# Patient Record
Sex: Female | Born: 1980
Health system: Southern US, Community
[De-identification: ages and names within clinical notes are randomized; demographics above are authoritative.]

## PROBLEM LIST (undated history)

## (undated) ENCOUNTER — Inpatient Hospital Stay (HOSPITAL_COMMUNITY): Payer: Self-pay

## (undated) DIAGNOSIS — C189 Malignant neoplasm of colon, unspecified: Secondary | ICD-10-CM

## (undated) DIAGNOSIS — F32A Depression, unspecified: Secondary | ICD-10-CM

## (undated) DIAGNOSIS — K602 Anal fissure, unspecified: Secondary | ICD-10-CM

## (undated) DIAGNOSIS — F419 Anxiety disorder, unspecified: Secondary | ICD-10-CM

## (undated) DIAGNOSIS — F329 Major depressive disorder, single episode, unspecified: Secondary | ICD-10-CM

## (undated) HISTORY — DX: Anxiety disorder, unspecified: F41.9

## (undated) HISTORY — PX: THERAPEUTIC ABORTION: SHX798

## (undated) HISTORY — DX: Depression, unspecified: F32.A

## (undated) HISTORY — PX: NO PAST SURGERIES: SHX2092

## (undated) HISTORY — DX: Malignant neoplasm of colon, unspecified: C18.9

---
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
## 1999-05-29 ENCOUNTER — Emergency Department (HOSPITAL_COMMUNITY): Admission: EM | Admit: 1999-05-29 | Discharge: 1999-05-29 | Payer: Self-pay | Admitting: Emergency Medicine

## 2000-10-03 ENCOUNTER — Inpatient Hospital Stay (HOSPITAL_COMMUNITY): Admission: AD | Admit: 2000-10-03 | Discharge: 2000-10-03 | Payer: Self-pay | Admitting: Obstetrics

## 2001-01-25 ENCOUNTER — Emergency Department (HOSPITAL_COMMUNITY): Admission: EM | Admit: 2001-01-25 | Discharge: 2001-01-25 | Payer: Self-pay

## 2001-03-10 ENCOUNTER — Inpatient Hospital Stay (HOSPITAL_COMMUNITY): Admission: AD | Admit: 2001-03-10 | Discharge: 2001-03-10 | Payer: Self-pay | Admitting: *Deleted

## 2001-04-02 ENCOUNTER — Ambulatory Visit (HOSPITAL_COMMUNITY): Admission: RE | Admit: 2001-04-02 | Discharge: 2001-04-02 | Payer: Self-pay | Admitting: *Deleted

## 2001-08-19 ENCOUNTER — Inpatient Hospital Stay (HOSPITAL_COMMUNITY): Admission: AD | Admit: 2001-08-19 | Discharge: 2001-08-19 | Payer: Self-pay | Admitting: *Deleted

## 2001-08-27 ENCOUNTER — Inpatient Hospital Stay (HOSPITAL_COMMUNITY): Admission: AD | Admit: 2001-08-27 | Discharge: 2001-08-29 | Payer: Self-pay | Admitting: *Deleted

## 2002-08-11 ENCOUNTER — Emergency Department (HOSPITAL_COMMUNITY): Admission: EM | Admit: 2002-08-11 | Discharge: 2002-08-11 | Payer: Self-pay | Admitting: Emergency Medicine

## 2003-03-21 ENCOUNTER — Emergency Department (HOSPITAL_COMMUNITY): Admission: EM | Admit: 2003-03-21 | Discharge: 2003-03-21 | Payer: Self-pay | Admitting: Emergency Medicine

## 2003-12-09 ENCOUNTER — Emergency Department (HOSPITAL_COMMUNITY): Admission: EM | Admit: 2003-12-09 | Discharge: 2003-12-10 | Payer: Self-pay

## 2005-01-11 ENCOUNTER — Emergency Department (HOSPITAL_COMMUNITY): Admission: EM | Admit: 2005-01-11 | Discharge: 2005-01-11 | Payer: Self-pay | Admitting: Emergency Medicine

## 2005-10-03 ENCOUNTER — Emergency Department (HOSPITAL_COMMUNITY): Admission: EM | Admit: 2005-10-03 | Discharge: 2005-10-03 | Payer: Self-pay | Admitting: Emergency Medicine

## 2006-06-20 ENCOUNTER — Emergency Department (HOSPITAL_COMMUNITY): Admission: EM | Admit: 2006-06-20 | Discharge: 2006-06-20 | Payer: Self-pay | Admitting: Emergency Medicine

## 2006-08-12 ENCOUNTER — Emergency Department (HOSPITAL_COMMUNITY): Admission: EM | Admit: 2006-08-12 | Discharge: 2006-08-12 | Payer: Self-pay | Admitting: *Deleted

## 2006-10-29 ENCOUNTER — Inpatient Hospital Stay (HOSPITAL_COMMUNITY): Admission: AD | Admit: 2006-10-29 | Discharge: 2006-10-29 | Payer: Self-pay | Admitting: Family Medicine

## 2007-02-06 ENCOUNTER — Emergency Department (HOSPITAL_COMMUNITY): Admission: EM | Admit: 2007-02-06 | Discharge: 2007-02-06 | Payer: Self-pay | Admitting: Family Medicine

## 2007-10-16 LAB — OB RESULTS CONSOLE VARICELLA ZOSTER ANTIBODY, IGG: Varicella: IMMUNE

## 2007-10-21 ENCOUNTER — Ambulatory Visit (HOSPITAL_COMMUNITY): Admission: RE | Admit: 2007-10-21 | Discharge: 2007-10-21 | Payer: Self-pay | Admitting: Family Medicine

## 2007-11-28 ENCOUNTER — Ambulatory Visit (HOSPITAL_COMMUNITY): Admission: RE | Admit: 2007-11-28 | Discharge: 2007-11-28 | Payer: Self-pay | Admitting: Family Medicine

## 2007-12-09 ENCOUNTER — Ambulatory Visit (HOSPITAL_COMMUNITY): Admission: RE | Admit: 2007-12-09 | Discharge: 2007-12-09 | Payer: Self-pay | Admitting: Family Medicine

## 2007-12-31 ENCOUNTER — Inpatient Hospital Stay (HOSPITAL_COMMUNITY): Admission: AD | Admit: 2007-12-31 | Discharge: 2007-12-31 | Payer: Self-pay | Admitting: Family Medicine

## 2008-04-12 ENCOUNTER — Inpatient Hospital Stay (HOSPITAL_COMMUNITY): Admission: AD | Admit: 2008-04-12 | Discharge: 2008-04-12 | Payer: Self-pay | Admitting: Obstetrics & Gynecology

## 2008-04-12 ENCOUNTER — Ambulatory Visit: Payer: Self-pay | Admitting: Obstetrics and Gynecology

## 2008-04-23 ENCOUNTER — Ambulatory Visit: Payer: Self-pay | Admitting: Obstetrics and Gynecology

## 2008-04-23 ENCOUNTER — Inpatient Hospital Stay (HOSPITAL_COMMUNITY): Admission: AD | Admit: 2008-04-23 | Discharge: 2008-04-23 | Payer: Self-pay | Admitting: Obstetrics & Gynecology

## 2008-05-04 ENCOUNTER — Ambulatory Visit: Payer: Self-pay | Admitting: Obstetrics and Gynecology

## 2008-05-04 ENCOUNTER — Inpatient Hospital Stay (HOSPITAL_COMMUNITY): Admission: AD | Admit: 2008-05-04 | Discharge: 2008-05-06 | Payer: Self-pay | Admitting: Obstetrics & Gynecology

## 2010-06-09 LAB — CBC
HCT: 36.3 % (ref 36.0–46.0)
MCHC: 33.8 g/dL (ref 30.0–36.0)
Platelets: 267 10*3/uL (ref 150–400)
RBC: 3.32 MIL/uL — ABNORMAL LOW (ref 3.87–5.11)
RDW: 13.8 % (ref 11.5–15.5)

## 2010-06-14 LAB — URINALYSIS, ROUTINE W REFLEX MICROSCOPIC
Glucose, UA: NEGATIVE mg/dL
Protein, ur: NEGATIVE mg/dL
Urobilinogen, UA: 0.2 mg/dL (ref 0.0–1.0)

## 2010-07-07 ENCOUNTER — Inpatient Hospital Stay (INDEPENDENT_AMBULATORY_CARE_PROVIDER_SITE_OTHER)
Admission: RE | Admit: 2010-07-07 | Discharge: 2010-07-07 | Disposition: A | Discharge status: Home / Self Care | Payer: Medicaid Other | Source: Ambulatory Visit | Attending: Family Medicine | Admitting: Family Medicine

## 2010-07-07 DIAGNOSIS — J020 Streptococcal pharyngitis: Secondary | ICD-10-CM

## 2010-07-07 LAB — POCT RAPID STREP A (OFFICE): Streptococcus, Group A Screen (Direct): POSITIVE — AB

## 2010-10-03 ENCOUNTER — Emergency Department (HOSPITAL_COMMUNITY)
Admission: EM | Admit: 2010-10-03 | Discharge: 2010-10-04 | Disposition: A | Discharge status: Home / Self Care | Payer: Self-pay | Attending: Emergency Medicine | Admitting: Emergency Medicine

## 2010-10-03 DIAGNOSIS — K921 Melena: Secondary | ICD-10-CM | POA: Insufficient documentation

## 2010-10-03 DIAGNOSIS — K5900 Constipation, unspecified: Secondary | ICD-10-CM | POA: Insufficient documentation

## 2010-10-03 DIAGNOSIS — K625 Hemorrhage of anus and rectum: Secondary | ICD-10-CM | POA: Insufficient documentation

## 2010-10-03 DIAGNOSIS — K644 Residual hemorrhoidal skin tags: Secondary | ICD-10-CM | POA: Insufficient documentation

## 2010-10-03 DIAGNOSIS — K6289 Other specified diseases of anus and rectum: Secondary | ICD-10-CM | POA: Insufficient documentation

## 2010-10-03 DIAGNOSIS — K648 Other hemorrhoids: Secondary | ICD-10-CM | POA: Insufficient documentation

## 2010-11-29 LAB — URINALYSIS, ROUTINE W REFLEX MICROSCOPIC
Bilirubin Urine: NEGATIVE
Ketones, ur: 15 — AB
Nitrite: NEGATIVE
Protein, ur: NEGATIVE
Urobilinogen, UA: 0.2

## 2010-12-05 LAB — POCT PREGNANCY, URINE: Operator id: 239701

## 2010-12-05 LAB — POCT URINALYSIS DIP (DEVICE)
Bilirubin Urine: NEGATIVE
Ketones, ur: NEGATIVE
Operator id: 239701

## 2010-12-09 LAB — URINALYSIS, ROUTINE W REFLEX MICROSCOPIC
Ketones, ur: NEGATIVE
Protein, ur: NEGATIVE
Urobilinogen, UA: 0.2

## 2010-12-09 LAB — WET PREP, GENITAL
Trich, Wet Prep: NONE SEEN
Yeast Wet Prep HPF POC: NONE SEEN

## 2010-12-09 LAB — GC/CHLAMYDIA PROBE AMP, GENITAL: Chlamydia, DNA Probe: NEGATIVE

## 2010-12-09 LAB — POCT PREGNANCY, URINE: Preg Test, Ur: NEGATIVE

## 2010-12-09 LAB — HCG, SERUM, QUALITATIVE: Preg, Serum: NEGATIVE

## 2010-12-09 LAB — URINE MICROSCOPIC-ADD ON

## 2010-12-14 LAB — DIFFERENTIAL
Basophils Absolute: 0
Basophils Relative: 0
Eosinophils Absolute: 0.1
Eosinophils Relative: 1
Monocytes Absolute: 0.7
Monocytes Relative: 7
Neutro Abs: 8.2 — ABNORMAL HIGH

## 2010-12-14 LAB — URINE MICROSCOPIC-ADD ON

## 2010-12-14 LAB — I-STAT 8, (EC8 V) (CONVERTED LAB)
BUN: 7
Bicarbonate: 26.2 — ABNORMAL HIGH
Chloride: 105
Glucose, Bld: 89
pCO2, Ven: 48.7

## 2010-12-14 LAB — CBC
Hemoglobin: 12
MCHC: 33.5
MCV: 91.2
RDW: 14.3 — ABNORMAL HIGH

## 2010-12-14 LAB — URINALYSIS, ROUTINE W REFLEX MICROSCOPIC
Bilirubin Urine: NEGATIVE
Hgb urine dipstick: NEGATIVE
Nitrite: NEGATIVE
Specific Gravity, Urine: 1.022
Urobilinogen, UA: 0.2
pH: 6

## 2010-12-14 LAB — POCT PREGNANCY, URINE: Preg Test, Ur: NEGATIVE

## 2011-08-15 ENCOUNTER — Encounter (HOSPITAL_COMMUNITY): Payer: Self-pay

## 2011-08-15 ENCOUNTER — Emergency Department (HOSPITAL_COMMUNITY)
Admission: EM | Admit: 2011-08-15 | Discharge: 2011-08-15 | Disposition: A | Discharge status: Home / Self Care | Payer: Medicaid Other | Source: Home / Self Care | Attending: Emergency Medicine | Admitting: Emergency Medicine

## 2011-08-15 DIAGNOSIS — J069 Acute upper respiratory infection, unspecified: Secondary | ICD-10-CM

## 2011-08-15 MED ORDER — AZITHROMYCIN 250 MG PO TABS: ORAL_TABLET | ORAL | Status: AC

## 2011-08-15 MED ORDER — NAPROXEN 500 MG PO TABS: 500.0000 mg | ORAL_TABLET | Freq: Two times a day (BID) | ORAL | Status: DC

## 2011-08-15 MED ORDER — ALBUTEROL SULFATE HFA 108 (90 BASE) MCG/ACT IN AERS: 1.0000 | INHALATION_SPRAY | Freq: Four times a day (QID) | RESPIRATORY_TRACT | Status: DC | PRN

## 2011-08-15 MED ORDER — BENZONATATE 200 MG PO CAPS: 200.0000 mg | ORAL_CAPSULE | Freq: Three times a day (TID) | ORAL | Status: AC | PRN

## 2011-08-15 NOTE — ED Provider Notes (Signed)
Chief Complaint  Patient presents with  . Cough    History of Present Illness:   The patient is a 31 year old female who has a 3 to four-day history of a cough productive of small amounts of clear sputum, pain over her sternum when she coughs, and a slight sore throat. She denies any fevers, chills, sweats, headache, nasal congestion, rhinorrhea, postnasal drip, wheezing, shortness of breath, abdominal pain, nausea, vomiting, or diarrhea. She is an occasional smoker.  Review of Systems:  Other than noted above, the patient denies any of the following symptoms. Systemic:  No fever, chills, sweats, fatigue, myalgias, headache, or anorexia. Eye:  No redness, pain or drainage. ENT:  No earache, ear congestion, nasal congestion, sneezing, rhinorrhea, sinus pressure, sinus pain, post nasal drip, or sore throat. Lungs:  No cough, sputum production, wheezing, shortness of breath, or chest pain. GI:  No abdominal pain, nausea, vomiting, or diarrhea. Skin:  No rash or itching.  PMFSH:  Past medical history, family history, social history, meds, and allergies were reviewed.  Physical Exam:   Vital signs:  BP 113/74  Pulse 73  Temp 98.4 F (36.9 C) (Oral)  Resp 18  SpO2 98%  LMP 08/08/2011 General:  Alert, in no distress. Eye:  No conjunctival injection or drainage. Lids were normal. ENT:  TMs and canals were normal, without erythema or inflammation.  Nasal mucosa was clear and uncongested, without drainage.  Mucous membranes were moist.  Pharynx was clear, without exudate or drainage.  There were no oral ulcerations or lesions. Neck:  Supple, no adenopathy, tenderness or mass. Lungs:  No respiratory distress.  Lungs were clear to auscultation, without wheezes, rales or rhonchi.  Breath sounds were clear and equal bilaterally. Lungs were resonant to percussion.  No egophony. Heart:  Regular rhythm, without gallops, murmers or rubs. Skin:  Clear, warm, and dry, without rash or lesions.  Labs:     Results for orders placed during the hospital encounter of 08/15/11  POCT RAPID STREP A ([facility] ONLY)      Component Value Range   Streptococcus, Group A Screen (Direct) NEGATIVE  NEGATIVE   Assessment:  The encounter diagnosis was Viral upper respiratory infection.  Plan:   1.  The following meds were prescribed:   New Prescriptions   ALBUTEROL (PROVENTIL HFA;VENTOLIN HFA) 108 (90 BASE) MCG/ACT INHALER    Inhale 1-2 puffs into the lungs every 6 (six) hours as needed for wheezing.   AZITHROMYCIN (ZITHROMAX Z-PAK) 250 MG TABLET    Take as directed.   BENZONATATE (TESSALON) 200 MG CAPSULE    Take 1 capsule (200 mg total) by mouth 3 (three) times daily as needed for cough.   NAPROXEN (NAPROSYN) 500 MG TABLET    Take 1 tablet (500 mg total) by mouth 2 (two) times daily.   2.  The patient was instructed in symptomatic care and handouts were given. 3.  The patient was told to return if becoming worse in any way, if no better in 3 or 4 days, and given some red flag symptoms that would indicate earlier return. She was strongly encouraged to quit smoking and I told her not to get the antibiotic prescription filled unless her illness lasted beyond a week to 10 days or if she developed any purulent sputum or nasal drainage.   Reuben Likes, MD 08/15/11 [phone]

## 2011-08-15 NOTE — ED Notes (Signed)
C/o cough, fever, ST; reportedly had blisters on back of throat this AM; NAD

## 2011-08-15 NOTE — Discharge Instructions (Signed)

## 2012-03-12 ENCOUNTER — Encounter (HOSPITAL_COMMUNITY): Payer: Self-pay | Admitting: *Deleted

## 2012-03-12 ENCOUNTER — Emergency Department (HOSPITAL_COMMUNITY)
Admission: EM | Admit: 2012-03-12 | Discharge: 2012-03-13 | Disposition: A | Discharge status: Home / Self Care | Payer: Self-pay | Attending: Emergency Medicine | Admitting: Emergency Medicine

## 2012-03-12 DIAGNOSIS — R141 Gas pain: Secondary | ICD-10-CM | POA: Insufficient documentation

## 2012-03-12 DIAGNOSIS — R142 Eructation: Secondary | ICD-10-CM | POA: Insufficient documentation

## 2012-03-12 DIAGNOSIS — R140 Abdominal distension (gaseous): Secondary | ICD-10-CM

## 2012-03-12 DIAGNOSIS — Z3202 Encounter for pregnancy test, result negative: Secondary | ICD-10-CM | POA: Insufficient documentation

## 2012-03-12 DIAGNOSIS — N949 Unspecified condition associated with female genital organs and menstrual cycle: Secondary | ICD-10-CM | POA: Insufficient documentation

## 2012-03-12 DIAGNOSIS — F17200 Nicotine dependence, unspecified, uncomplicated: Secondary | ICD-10-CM | POA: Insufficient documentation

## 2012-03-12 LAB — CBC WITH DIFFERENTIAL/PLATELET
Basophils Absolute: 0 10*3/uL (ref 0.0–0.1)
Basophils Relative: 1 % (ref 0–1)
Eosinophils Absolute: 0.2 10*3/uL (ref 0.0–0.7)
Eosinophils Relative: 2 % (ref 0–5)
HCT: 33.1 % — ABNORMAL LOW (ref 36.0–46.0)
Hemoglobin: 11.3 g/dL — ABNORMAL LOW (ref 12.0–15.0)
MCH: 30.3 pg (ref 26.0–34.0)
MCHC: 34.1 g/dL (ref 30.0–36.0)
MCV: 88.7 fL (ref 78.0–100.0)
Monocytes Absolute: 0.6 10*3/uL (ref 0.1–1.0)
Monocytes Relative: 8 % (ref 3–12)
RDW: 13.8 % (ref 11.5–15.5)

## 2012-03-12 LAB — URINALYSIS, ROUTINE W REFLEX MICROSCOPIC
Glucose, UA: NEGATIVE mg/dL
Hgb urine dipstick: NEGATIVE
Ketones, ur: NEGATIVE mg/dL
Protein, ur: NEGATIVE mg/dL
Urobilinogen, UA: 0.2 mg/dL (ref 0.0–1.0)

## 2012-03-12 LAB — POCT PREGNANCY, URINE: Preg Test, Ur: NEGATIVE

## 2012-03-12 MED ORDER — SODIUM CHLORIDE 0.9 % IV BOLUS (SEPSIS)
1000.0000 mL | Freq: Once | INTRAVENOUS | Status: AC
  Administered 2012-03-13: 1000 mL via INTRAVENOUS

## 2012-03-12 NOTE — ED Notes (Signed)
Pt states she has had bloating and gas x 2-3 days.  Denies vomiting, diarrhea, dysuria, no vaginal discharge/no bleeding.

## 2012-03-12 NOTE — ED Provider Notes (Signed)
History     CSN: [id number]  Arrival date & time 03/12/12  4098   First MD Initiated Contact with Patient 03/12/12 2233      Chief Complaint  Patient presents with  . Abdominal Pain    (Consider location/radiation/quality/duration/timing/severity/associated sxs/prior treatment) HPI Comments: Katie Reed is a 32 year old female patient with a history of G3P3 and hemorrhoids who presents to the emergency department complaining of abdominal bloating that started three days ago.  Associated symptoms include increased flatulence and 24 hours of spotting that occurred last Saturday. Patient is the depo shot shot.   After the spotting stopped she began having the abdominal bloating.  She stated she notices some pelvic pain when she pushes on her pelvic region, but has not abdominal pain at rest and denies focal abdominal pain or tenderness.  She denies nausea, vomiting, diarrhea, constipation, headaches, neck pain, chest pain, dizziness, lightheadedness, syncope, shortness of breath, unusual vaginal discharge, urinary urgency, frequency or burning and lower extremity edema or pain.  She took castor oil without relief of the abdominal bloating.  The history is provided by the patient and medical records.    History reviewed. No pertinent past medical history.  History reviewed. No pertinent past surgical history.  History reviewed. No pertinent family history.  History  Substance Use Topics  . Smoking status: Current Some Day Smoker  . Smokeless tobacco: Not on file  . Alcohol Use: Yes    OB History    Grav Para Term Preterm Abortions TAB SAB Ect Mult Living                  Review of Systems  Constitutional: Negative for fever, diaphoresis, appetite change, fatigue and unexpected weight change.  HENT: Negative for mouth sores, trouble swallowing, neck pain and neck stiffness.   Respiratory: Negative for cough, chest tightness, shortness of breath, wheezing and stridor.     Cardiovascular: Negative for chest pain and palpitations.  Gastrointestinal: Positive for abdominal pain and abdominal distention. Negative for nausea, vomiting, diarrhea, constipation, blood in stool (discomfort) and rectal pain.  Genitourinary: Negative for dysuria, urgency, frequency, hematuria, flank pain and difficulty urinating.  Musculoskeletal: Negative for back pain.  Skin: Negative for rash.  Neurological: Negative for weakness.  Hematological: Negative for adenopathy.  Psychiatric/Behavioral: Negative for confusion.  All other systems reviewed and are negative.    Allergies  Review of patient's allergies indicates no known allergies.  Home Medications   Current Outpatient Rx  Name  Route  Sig  Dispense  Refill  . DICYCLOMINE HCL 10 MG PO CAPS   Oral   Take 1 capsule (10 mg total) by mouth 4 (four) times daily -  before meals and at bedtime.   40 capsule   1     BP 128/81  Pulse 74  Temp 98.3 F (36.8 C) (Oral)  Resp 20  SpO2 100%  LMP 03/09/2012  Physical Exam  Nursing note and vitals reviewed. Constitutional: She is oriented to person, place, and time. She appears well-developed and well-nourished. No distress.  HENT:  Head: Normocephalic and atraumatic.  Mouth/Throat: Oropharynx is clear and moist. No oropharyngeal exudate.  Eyes: Conjunctivae normal are normal. No scleral icterus.  Neck: Normal range of motion. Neck supple.  Cardiovascular: Normal rate, regular rhythm, normal heart sounds and intact distal pulses.  Exam reveals no gallop and no friction rub.   No murmur heard. Pulmonary/Chest: Effort normal and breath sounds normal. No respiratory distress. She has no wheezes.  She has no rales. She exhibits no tenderness.  Abdominal: Soft. Normal appearance. She exhibits distension. She exhibits no fluid wave, no abdominal bruit, no ascites and no mass. Bowel sounds are increased. There is no hepatosplenomegaly. There is no tenderness. There is no  rigidity, no rebound, no guarding, no CVA tenderness, no tenderness at McBurney's point and negative Murphy's sign.  Musculoskeletal: Normal range of motion. She exhibits no edema and no tenderness.  Lymphadenopathy:    She has no cervical adenopathy.  Neurological: She is alert and oriented to person, place, and time. She exhibits normal muscle tone. Coordination normal.       Speech is clear and goal oriented Moves extremities without ataxia  Skin: Skin is warm and dry. No rash noted. She is not diaphoretic. No erythema.  Psychiatric: She has a normal mood and affect.    ED Course  Procedures (including critical care time)  Labs Reviewed  CBC WITH DIFFERENTIAL - Abnormal; Notable for the following:    RBC 3.73 (*)     Hemoglobin 11.3 (*)     HCT 33.1 (*)     Neutrophils Relative 42 (*)     Lymphocytes Relative 48 (*)     All other components within normal limits  COMPREHENSIVE METABOLIC PANEL - Abnormal; Notable for the following:    Sodium 134 (*)     Glucose, Bld 100 (*)     Total Bilirubin 0.2 (*)     GFR calc non Af Amer 90 (*)     All other components within normal limits  URINALYSIS, ROUTINE W REFLEX MICROSCOPIC  POCT PREGNANCY, URINE  LIPASE, BLOOD   No results found.   1. Abdominal bloating       MDM  Christianne Borrow presents with  abdominal bloating to 3 days without focal abdominal tenderness.  She is without vomiting diarrhea dysuria vaginal discharge or vaginal bleeding.  Patient is nontoxic, nonseptic appearing, in no apparent distress.  Patient is without abdominal pain on exam.  Fluid bolus given.  Labs and vitals reviewed.  Do not believe that a CT scan is indicated at this time as patient is a soft, nontender abdomen without peritoneal signs and without focal abdominal tenderness. Patient does not meet the SIRS or Sepsis criteria.  On repeat exam patient does not have a surgical abdomin and there are nor peritoneal signs.  No concern for of appendicitis,  bowel obstruction, bowel perforation, cholecystitis, diverticulitis, PID or ectopic pregnancy.  Patient discharged home with symptomatic treatment including bentyl and given strict instructions for follow-up with their primary care physician.  I have also discussed reasons to return immediately to the ER.  Patient expresses understanding and agrees with plan.  1. Medications: bentyl, usual home medications 2. Treatment: rest, drink plenty of fluids, medication as prescribed 3. Follow Up: Please followup with your primary doctor for discussion of your diagnoses and further evaluation after today's visit; if you do not have a primary care doctor use the resource guide provided to find one;             Dierdre Forth, PA-C 03/13/12 0042

## 2012-03-13 LAB — COMPREHENSIVE METABOLIC PANEL
AST: 21 U/L (ref 0–37)
Albumin: 3.7 g/dL (ref 3.5–5.2)
BUN: 23 mg/dL (ref 6–23)
Calcium: 9.6 mg/dL (ref 8.4–10.5)
Chloride: 99 mEq/L (ref 96–112)
Creatinine, Ser: 0.85 mg/dL (ref 0.50–1.10)
Total Bilirubin: 0.2 mg/dL — ABNORMAL LOW (ref 0.3–1.2)
Total Protein: 7.3 g/dL (ref 6.0–8.3)

## 2012-03-13 LAB — LIPASE, BLOOD: Lipase: 36 U/L (ref 11–59)

## 2012-03-13 MED ORDER — DICYCLOMINE HCL 10 MG PO CAPS: 10.0000 mg | ORAL_CAPSULE | Freq: Three times a day (TID) | ORAL | Status: DC

## 2012-03-13 NOTE — ED Provider Notes (Signed)
Medical screening examination/treatment/procedure(s) were performed by non-physician practitioner and as supervising physician I was immediately available for consultation/collaboration.   Dione Booze, MD 03/13/12 [phone]

## 2012-03-13 NOTE — ED Notes (Signed)
Pt denies any questions or pain upon discharge. 

## 2012-06-17 ENCOUNTER — Emergency Department (HOSPITAL_COMMUNITY)
Admission: EM | Admit: 2012-06-17 | Discharge: 2012-06-17 | Disposition: A | Discharge status: Home / Self Care | Payer: Self-pay | Attending: Emergency Medicine | Admitting: Emergency Medicine

## 2012-06-17 ENCOUNTER — Encounter (HOSPITAL_COMMUNITY): Payer: Self-pay | Admitting: *Deleted

## 2012-06-17 DIAGNOSIS — Z113 Encounter for screening for infections with a predominantly sexual mode of transmission: Secondary | ICD-10-CM | POA: Insufficient documentation

## 2012-06-17 DIAGNOSIS — A64 Unspecified sexually transmitted disease: Secondary | ICD-10-CM

## 2012-06-17 DIAGNOSIS — F17200 Nicotine dependence, unspecified, uncomplicated: Secondary | ICD-10-CM | POA: Insufficient documentation

## 2012-06-17 DIAGNOSIS — Z3202 Encounter for pregnancy test, result negative: Secondary | ICD-10-CM | POA: Insufficient documentation

## 2012-06-17 DIAGNOSIS — N898 Other specified noninflammatory disorders of vagina: Secondary | ICD-10-CM | POA: Insufficient documentation

## 2012-06-17 LAB — URINALYSIS, ROUTINE W REFLEX MICROSCOPIC
Bilirubin Urine: NEGATIVE
Ketones, ur: NEGATIVE mg/dL
Leukocytes, UA: NEGATIVE
Nitrite: NEGATIVE
Specific Gravity, Urine: 1.008 (ref 1.005–1.030)
Urobilinogen, UA: 0.2 mg/dL (ref 0.0–1.0)

## 2012-06-17 LAB — HIV ANTIBODY (ROUTINE TESTING W REFLEX): HIV: NONREACTIVE

## 2012-06-17 LAB — RPR: RPR Ser Ql: NONREACTIVE

## 2012-06-17 LAB — WET PREP, GENITAL: Trich, Wet Prep: NONE SEEN

## 2012-06-17 MED ORDER — CEFTRIAXONE SODIUM 250 MG IJ SOLR
250.0000 mg | Freq: Once | INTRAMUSCULAR | Status: AC
  Administered 2012-06-17: 250 mg via INTRAMUSCULAR
  Filled 2012-06-17: qty 250

## 2012-06-17 MED ORDER — LIDOCAINE HCL (PF) 1 % IJ SOLN
INTRAMUSCULAR | Status: AC
  Administered 2012-06-17: 2 mL
  Filled 2012-06-17: qty 5

## 2012-06-17 MED ORDER — AZITHROMYCIN 1 G PO PACK
1.0000 g | PACK | Freq: Once | ORAL | Status: AC
  Administered 2012-06-17: 1 g via ORAL
  Filled 2012-06-17: qty 1

## 2012-06-17 NOTE — ED Notes (Signed)
Pt states that she had unprotected sex and was told by the partner that she needed to get tested. Pt also on period at current week

## 2012-06-17 NOTE — ED Provider Notes (Signed)
History     CSN: [id number]  Arrival date & time 06/17/12  7829   First MD Initiated Contact with Patient 06/17/12 [phone]      Chief Complaint  Patient presents with  . SEXUALLY TRANSMITTED DISEASE    (Consider location/radiation/quality/duration/timing/severity/associated sxs/prior treatment) HPI  Patient is a G43P3 32 yo F presenting to ED after having unprotected sexual intercourse requesting testing. Patient states partner told her to come in and get tested, but would not tell her what he was positive for. Pt states she is currently on her period. Denies any pelvic pain, vaginal discharge, fevers, chills, nausea, vomiting. Patient has no other physical complaints at this time.   History reviewed. No pertinent past medical history.  History reviewed. No pertinent past surgical history.  History reviewed. No pertinent family history.  History  Substance Use Topics  . Smoking status: Current Some Day Smoker  . Smokeless tobacco: Not on file  . Alcohol Use: Yes    OB History   Grav Para Term Preterm Abortions TAB SAB Ect Mult Living                  Review of Systems  Genitourinary: Positive for vaginal bleeding.  All other systems reviewed and are negative.    Allergies  Review of patient's allergies indicates no known allergies.  Home Medications   Current Outpatient Rx  Name  Route  Sig  Dispense  Refill  . dicyclomine (BENTYL) 10 MG capsule   Oral   Take 1 capsule (10 mg total) by mouth 4 (four) times daily -  before meals and at bedtime.   40 capsule   1     BP 117/83  Pulse 96  Temp(Src) 97.7 F (36.5 C) (Oral)  Resp 20  SpO2 100%  Physical Exam  Constitutional: She is oriented to person, place, and time. She appears well-developed and well-nourished.  HENT:  Head: Normocephalic and atraumatic.  Eyes: EOM are normal. Pupils are equal, round, and reactive to light.  Neck: Neck supple.  Cardiovascular: Normal rate, regular rhythm and normal  heart sounds.   Pulmonary/Chest: Effort normal and breath sounds normal.  Abdominal: Soft. Bowel sounds are normal.  Genitourinary: Uterus normal. Cervix exhibits no motion tenderness, no discharge and no friability. Right adnexum displays no mass, no tenderness and no fullness. Left adnexum displays no mass, no tenderness and no fullness. Vaginal discharge found.  Scant bloody discharge d/t menstruation   Neurological: She is alert and oriented to person, place, and time.  Skin: Skin is warm and dry.  Psychiatric: She has a normal mood and affect.    ED Course  Procedures (including critical care time)  Medications  azithromycin (ZITHROMAX) powder 1 g (not administered)  cefTRIAXone (ROCEPHIN) injection 250 mg (not administered)  lidocaine (PF) (XYLOCAINE) 1 % injection (not administered)     Labs Reviewed  WET PREP, GENITAL - Abnormal; Notable for the following:    WBC, Wet Prep HPF POC FEW (*)    All other components within normal limits  GC/CHLAMYDIA PROBE AMP  HEPATITIS PANEL, ACUTE  RPR  HIV ANTIBODY (ROUTINE TESTING)  URINALYSIS, ROUTINE W REFLEX MICROSCOPIC  PREGNANCY, URINE   No results found.   1. STI (sexually transmitted infection)       MDM  Patient to be discharged with instructions to follow up with OBGYN. Pt understands GC/Chlamydia cultures pending and that they will need to inform all sexual partners within the last 6 months if results return positive.  Pt also had lab draws for HIV, Hepatitis and Syphilis pending will be notified with positive result. Pt has been treated prophylacticly with azithromycin and rocephin due to pts history, pelvic exam, and wet prep with increased WBCs. Pt advised that she will receive a call in 48 hours if the test is positive and to refrain from sexual activity for 48 hours. If the test is positive, pt is advised to refrain from sexual activity for 10 days for the medicine to take effect.  Pt not concerning for PID because  hemodynamically stable and no cervical motion tenderness on pelvic exam. Pt has been advised to not drink alcohol while on this medication. Discussed that because pt has had recent unprotected sex, might want to consider getting tested for HIV as well. Counseled pt that latex condoms are the only way to prevent against STDs or HIV. Patient agreeable to plan. Patient d/w with Dr. Effie Shy, agrees with plan. Patient is stable at time of discharge.               Jeannetta Ellis, PA-C 06/17/12 1620

## 2012-06-17 NOTE — ED Provider Notes (Signed)
Medical screening examination/treatment/procedure(s) were performed by non-physician practitioner and as supervising physician I was immediately available for consultation/collaboration.  Flint Melter, MD 06/17/12 [phone]

## 2012-06-18 LAB — GC/CHLAMYDIA PROBE AMP
CT Probe RNA: POSITIVE — AB
GC Probe RNA: NEGATIVE

## 2012-06-18 LAB — HEPATITIS PANEL, ACUTE: HCV Ab: NEGATIVE

## 2012-06-19 ENCOUNTER — Telehealth (HOSPITAL_COMMUNITY): Payer: Self-pay | Admitting: Emergency Medicine

## 2012-06-19 NOTE — ED Notes (Signed)
Patient has +Chlamydia. 

## 2012-06-19 NOTE — ED Notes (Signed)
+  Chlamydia. Patient treated with Rocephin and Zithromax. [employer] faxed. 

## 2012-06-21 ENCOUNTER — Telehealth (HOSPITAL_COMMUNITY): Payer: Self-pay | Admitting: Emergency Medicine

## 2012-06-22 ENCOUNTER — Telehealth (HOSPITAL_COMMUNITY): Payer: Self-pay | Admitting: Emergency Medicine

## 2012-06-23 NOTE — ED Notes (Signed)
Unable to contact patient via phone. Sent letter. °

## 2012-07-12 NOTE — ED Notes (Addendum)
Patient informed of positive results after id'd x 2 and informed of need to notify partner to be treated. 

## 2012-11-07 LAB — OB RESULTS CONSOLE RPR
RPR: NONREACTIVE
RPR: NONREACTIVE

## 2012-11-07 LAB — OB RESULTS CONSOLE ABO/RH: RH Type: POSITIVE

## 2012-11-07 LAB — OB RESULTS CONSOLE GC/CHLAMYDIA
Chlamydia: NEGATIVE
GC PROBE AMP, GENITAL: NEGATIVE

## 2012-11-07 LAB — OB RESULTS CONSOLE ANTIBODY SCREEN: Antibody Screen: NEGATIVE

## 2012-11-07 LAB — OB RESULTS CONSOLE HIV ANTIBODY (ROUTINE TESTING): HIV: NONREACTIVE

## 2012-11-07 LAB — OB RESULTS CONSOLE HEPATITIS B SURFACE ANTIGEN: HEP B S AG: NEGATIVE

## 2012-11-07 LAB — OB RESULTS CONSOLE RUBELLA ANTIBODY, IGM: Rubella: IMMUNE

## 2012-11-08 ENCOUNTER — Other Ambulatory Visit (HOSPITAL_COMMUNITY): Payer: Self-pay | Admitting: Nurse Practitioner

## 2012-11-08 DIAGNOSIS — Z3689 Encounter for other specified antenatal screening: Secondary | ICD-10-CM

## 2012-11-18 ENCOUNTER — Ambulatory Visit (HOSPITAL_COMMUNITY)
Admission: RE | Admit: 2012-11-18 | Discharge: 2012-11-18 | Disposition: A | Discharge status: Home / Self Care | Payer: Medicaid Other | Source: Ambulatory Visit | Attending: Nurse Practitioner | Admitting: Nurse Practitioner

## 2012-11-18 ENCOUNTER — Ambulatory Visit (HOSPITAL_COMMUNITY): Admission: RE | Admit: 2012-11-18 | Payer: Medicaid Other | Source: Ambulatory Visit

## 2012-11-18 DIAGNOSIS — O99330 Smoking (tobacco) complicating pregnancy, unspecified trimester: Secondary | ICD-10-CM | POA: Insufficient documentation

## 2012-11-18 DIAGNOSIS — Z3689 Encounter for other specified antenatal screening: Secondary | ICD-10-CM | POA: Insufficient documentation

## 2012-12-08 ENCOUNTER — Encounter (HOSPITAL_COMMUNITY): Payer: Self-pay

## 2012-12-08 ENCOUNTER — Inpatient Hospital Stay (HOSPITAL_COMMUNITY): Payer: Medicaid Other

## 2012-12-08 ENCOUNTER — Inpatient Hospital Stay (HOSPITAL_COMMUNITY)
Admission: AD | Admit: 2012-12-08 | Discharge: 2012-12-08 | Disposition: A | Discharge status: Home / Self Care | Payer: Medicaid Other | Source: Ambulatory Visit | Attending: Obstetrics & Gynecology | Admitting: Obstetrics & Gynecology

## 2012-12-08 DIAGNOSIS — O99891 Other specified diseases and conditions complicating pregnancy: Secondary | ICD-10-CM | POA: Insufficient documentation

## 2012-12-08 DIAGNOSIS — R1033 Periumbilical pain: Secondary | ICD-10-CM | POA: Insufficient documentation

## 2012-12-08 DIAGNOSIS — O99330 Smoking (tobacco) complicating pregnancy, unspecified trimester: Secondary | ICD-10-CM | POA: Insufficient documentation

## 2012-12-08 HISTORY — DX: Anxiety disorder, unspecified: F41.9

## 2012-12-08 HISTORY — DX: Depression, unspecified: F32.A

## 2012-12-08 HISTORY — DX: Major depressive disorder, single episode, unspecified: F32.9

## 2012-12-08 LAB — URINALYSIS, ROUTINE W REFLEX MICROSCOPIC
Glucose, UA: NEGATIVE mg/dL
Hgb urine dipstick: NEGATIVE
Specific Gravity, Urine: 1.01 (ref 1.005–1.030)
Urobilinogen, UA: 0.2 mg/dL (ref 0.0–1.0)
pH: 6 (ref 5.0–8.0)

## 2012-12-08 LAB — WET PREP, GENITAL
Trich, Wet Prep: NONE SEEN
Yeast Wet Prep HPF POC: NONE SEEN

## 2012-12-08 NOTE — MAU Provider Note (Signed)
@MAUPATCONTACT @  Chief Complaint:  Abdominal Pain   Katie Reed is  32 y.o. [phone] at [redacted]w[redacted]d presents complaining of  periumbilicus pain, which started on Tuesday and  occurs at least twice a day. She denies leaking, gush of fluid or vaginal bleeding. She denies fever, dysuria, frequency or back pain. She denies vaginal discharge or irritation. She has not had sexual intercourse in weeks. She has had three term children vaginally without pre-term labor. She has received [facility] at the health department starting when she was [redacted] weeks gestation. She denies complications with this pregnancy. Recent US was normal. She is a daily smoker.  Obstetrical/Gynecological History: OB History   Grav Para Term Preterm Abortions TAB SAB Ect Mult Living   4 3 3       3      Past Medical History: Past Medical History  Diagnosis Date  . Anxiety   . Depression     Past Surgical History: Past Surgical History  Procedure Laterality Date  . No past surgeries      Family History: History reviewed. No pertinent family history.  Social History: History  Substance Use Topics  . Smoking status: Current Some Day Smoker    Types: Cigarettes  . Smokeless tobacco: Never Used  . Alcohol Use: No    Allergies: No Known Allergies  Meds:  Prescriptions prior to admission  Medication Sig Dispense Refill  . FLUoxetine (PROZAC) 10 MG capsule Take 10 mg by mouth daily.      . Prenatal Vit-Fe Fumarate-FA (PRENATAL MULTIVITAMIN) TABS tablet Take 1 tablet by mouth daily at 12 noon.        Review of Systems -   Review of Systems  Constitutional: Negative for fever, chills, weight loss, malaise/fatigue and diaphoresis.  HENT: Negative for hearing loss, ear pain, nosebleeds, congestion, sore throat, neck pain, tinnitus and ear discharge.   Eyes: Negative for blurred vision, double vision, photophobia, pain, discharge and redness.  Respiratory: Negative for cough, hemoptysis, sputum production, shortness of  breath, wheezing and stridor.   Cardiovascular: Negative for chest pain, palpitations, orthopnea,  leg swelling  Gastrointestinal: Negative for abdominal pain heartburn, nausea, vomiting, diarrhea, constipation, blood in stool Genitourinary: Negative for dysuria, urgency, frequency, hematuria and flank pain.  Musculoskeletal: Negative for myalgias, back pain, joint pain and falls.  Skin: Negative for itching and rash.  Neurological: Negative for dizziness, tingling, tremors, sensory change, speech change, focal weakness, seizures, loss of consciousness, weakness and headaches.  Endo/Heme/Allergies: Negative for environmental allergies and polydipsia. Does not bruise/bleed easily.  Psychiatric/Behavioral: Negative for depression, suicidal ideas, hallucinations, memory loss and substance abuse. The patient is not nervous/anxious and does not have insomnia.      Physical Exam  Blood pressure 110/70, pulse 79, temperature 98.4 F (36.9 C), temperature source Oral, resp. rate 16, height 5\' 3"  (1.6 m), weight 74.481 kg (164 lb 3.2 oz), last menstrual period 03/09/2012. GENERAL: Well-developed, well-nourished female in no acute distress.  LUNGS: Clear to auscultation bilaterally.  HEART: Regular rate and rhythm. ABDOMEN: Soft, nontender, nondistended, gravid.  EXTREMITIES: Nontender, no edema, 2+ distal pulses. CERVICAL EXAM: Dilatation closed/thick/ high   FHT:  Baseline rate 135 bpm    Contractions: none, irritability    Labs: Results for orders placed during the hospital encounter of 12/08/12 (from the past 24 hour(s))  URINALYSIS, ROUTINE W REFLEX MICROSCOPIC   Collection Time    12/08/12  4:55 PM      Result Value Range   Color, Urine YELLOW  YELLOW   APPearance CLEAR  CLEAR   Specific Gravity, Urine 1.010  1.005 - 1.030   pH 6.0  5.0 - 8.0   Glucose, UA NEGATIVE  NEGATIVE mg/dL   Hgb urine dipstick NEGATIVE  NEGATIVE   Bilirubin Urine NEGATIVE  NEGATIVE   Ketones, ur NEGATIVE   NEGATIVE mg/dL   Protein, ur NEGATIVE  NEGATIVE mg/dL   Urobilinogen, UA 0.2  0.0 - 1.0 mg/dL   Nitrite NEGATIVE  NEGATIVE   Leukocytes, UA NEGATIVE  NEGATIVE   Imaging Studies:  US Ob Comp + 14 Wk  11/18/2012   OBSTETRICAL ULTRASOUND: This exam was performed within a [facility]. The OB US report was generated in the [facility] system, and faxed to the ordering physician.   This report is also available in [employer] and in the [employer]. See [facility] Obstetric US report.   Assessment/Plan: Katie Reed is  32 y.o. [phone] at [redacted]w[redacted]d presents with periumbilicus pain, that occurs at least twice a day.  -Smoking cessation and counseling offered (declined) - Cervical length: abdominal and transvaginal US approach with all measurements >3cm - Wet prep: Negative - UA: Negative - G/C cultures pending - Encouraged PO fluids - F/U with Health department within a week.    Claiborne Billings, Renee 10/12/20146:00 PM

## 2012-12-08 NOTE — Progress Notes (Signed)
Notified pt in MAU for c/o tightening. Did note ?ctx's.

## 2012-12-08 NOTE — MAU Note (Signed)
Pt states here for lower abdominal pain, tightening around umbilicus. Denies bleeding or abnormal vaginal discharge. Has felt pain for a few days however has not improved. Pain is intermittent and seems worse when lying down.

## 2012-12-09 LAB — GC/CHLAMYDIA PROBE AMP: CT Probe RNA: NEGATIVE

## 2012-12-09 NOTE — MAU Provider Note (Signed)
I spoke with and examined patient and agree with resident's note and plan of care.  Tawana Scale, MD OB Fellow 12/09/2012 8:10 PM

## 2013-02-27 NOTE — L&D Delivery Note (Signed)
Delivery Note At 11:04 AM a viable female was delivered via Vaginal, Spontaneous Delivery (Presentation: Right Occiput Anterior).  APGAR: 8, 9; weight .   Placenta status: Intact, Spontaneous.  Cord: 3 vessels with the following complications: None.   Anesthesia: None  Episiotomy: None Lacerations: None Suture Repair: na Est. Blood Loss (mL): 300  Mom to postpartum.  Baby to Couplet care / Skin to Skin.  Tynesia Harral RYAN 03/18/2013, 12:00 PM

## 2013-03-12 ENCOUNTER — Encounter: Payer: Self-pay | Admitting: *Deleted

## 2013-03-17 ENCOUNTER — Encounter (HOSPITAL_COMMUNITY): Payer: Self-pay | Admitting: *Deleted

## 2013-03-17 ENCOUNTER — Inpatient Hospital Stay (HOSPITAL_COMMUNITY)
Admission: AD | Admit: 2013-03-17 | Discharge: 2013-03-19 | DRG: 775 | Disposition: A | Discharge status: Home / Self Care | Payer: Medicaid Other | Source: Ambulatory Visit | Attending: Obstetrics & Gynecology | Admitting: Obstetrics & Gynecology

## 2013-03-17 DIAGNOSIS — O42919 Preterm premature rupture of membranes, unspecified as to length of time between rupture and onset of labor, unspecified trimester: Secondary | ICD-10-CM

## 2013-03-17 DIAGNOSIS — O99344 Other mental disorders complicating childbirth: Secondary | ICD-10-CM | POA: Diagnosis present

## 2013-03-17 DIAGNOSIS — F329 Major depressive disorder, single episode, unspecified: Secondary | ICD-10-CM | POA: Diagnosis present

## 2013-03-17 DIAGNOSIS — O4290 Premature rupture of membranes, unspecified as to length of time between rupture and onset of labor, unspecified weeks of gestation: Principal | ICD-10-CM | POA: Diagnosis present

## 2013-03-17 DIAGNOSIS — F3289 Other specified depressive episodes: Secondary | ICD-10-CM | POA: Diagnosis present

## 2013-03-17 DIAGNOSIS — F1010 Alcohol abuse, uncomplicated: Secondary | ICD-10-CM | POA: Diagnosis present

## 2013-03-17 DIAGNOSIS — O42913 Preterm premature rupture of membranes, unspecified as to length of time between rupture and onset of labor, third trimester: Secondary | ICD-10-CM | POA: Diagnosis present

## 2013-03-17 DIAGNOSIS — O99334 Smoking (tobacco) complicating childbirth: Secondary | ICD-10-CM | POA: Diagnosis present

## 2013-03-17 LAB — URINALYSIS, ROUTINE W REFLEX MICROSCOPIC
Bilirubin Urine: NEGATIVE
GLUCOSE, UA: NEGATIVE mg/dL
Hgb urine dipstick: NEGATIVE
KETONES UR: NEGATIVE mg/dL
LEUKOCYTES UA: NEGATIVE
NITRITE: NEGATIVE
PH: 6.5 (ref 5.0–8.0)
PROTEIN: NEGATIVE mg/dL
Specific Gravity, Urine: 1.01 (ref 1.005–1.030)
Urobilinogen, UA: 0.2 mg/dL (ref 0.0–1.0)

## 2013-03-17 LAB — CBC
HCT: 31.3 % — ABNORMAL LOW (ref 36.0–46.0)
Hemoglobin: 10.1 g/dL — ABNORMAL LOW (ref 12.0–15.0)
MCH: 26.4 pg (ref 26.0–34.0)
MCHC: 32.3 g/dL (ref 30.0–36.0)
MCV: 81.9 fL (ref 78.0–100.0)
PLATELETS: 433 10*3/uL — AB (ref 150–400)
RBC: 3.82 MIL/uL — ABNORMAL LOW (ref 3.87–5.11)
RDW: 14.1 % (ref 11.5–15.5)
WBC: 8.1 10*3/uL (ref 4.0–10.5)

## 2013-03-17 MED ORDER — LACTATED RINGERS IV SOLN
500.0000 mL | INTRAVENOUS | Status: DC | PRN
  Administered 2013-03-18: 500 mL via INTRAVENOUS

## 2013-03-17 MED ORDER — OXYTOCIN 40 UNITS IN LACTATED RINGERS INFUSION - SIMPLE MED
62.5000 mL/h | INTRAVENOUS | Status: DC
  Filled 2013-03-17: qty 1000

## 2013-03-17 MED ORDER — PENICILLIN G POTASSIUM 5000000 UNITS IJ SOLR
2.5000 10*6.[IU] | INTRAVENOUS | Status: DC
  Administered 2013-03-18 (×3): 2.5 10*6.[IU] via INTRAVENOUS
  Filled 2013-03-17 (×7): qty 2.5

## 2013-03-17 MED ORDER — IBUPROFEN 600 MG PO TABS
600.0000 mg | ORAL_TABLET | Freq: Four times a day (QID) | ORAL | Status: DC | PRN
  Administered 2013-03-18: 600 mg via ORAL
  Filled 2013-03-17: qty 1

## 2013-03-17 MED ORDER — LIDOCAINE HCL (PF) 1 % IJ SOLN
30.0000 mL | INTRAMUSCULAR | Status: DC | PRN
  Filled 2013-03-17 (×2): qty 30

## 2013-03-17 MED ORDER — PENICILLIN G POTASSIUM 5000000 UNITS IJ SOLR
5.0000 10*6.[IU] | Freq: Once | INTRAVENOUS | Status: AC
  Administered 2013-03-17: 5 10*6.[IU] via INTRAVENOUS
  Filled 2013-03-17: qty 5

## 2013-03-17 MED ORDER — ACETAMINOPHEN 325 MG PO TABS: 650.0000 mg | ORAL_TABLET | ORAL | Status: DC | PRN

## 2013-03-17 MED ORDER — CITRIC ACID-SODIUM CITRATE 334-500 MG/5ML PO SOLN: 30.0000 mL | ORAL | Status: DC | PRN

## 2013-03-17 MED ORDER — FLUOXETINE HCL 10 MG PO CAPS
10.0000 mg | ORAL_CAPSULE | Freq: Every day | ORAL | Status: DC
  Administered 2013-03-19: 10 mg via ORAL
  Filled 2013-03-17 (×2): qty 1

## 2013-03-17 MED ORDER — PROMETHAZINE HCL 25 MG/ML IJ SOLN
12.5000 mg | Freq: Four times a day (QID) | INTRAMUSCULAR | Status: DC | PRN
  Administered 2013-03-18: 12.5 mg via INTRAVENOUS
  Filled 2013-03-17: qty 1

## 2013-03-17 MED ORDER — OXYTOCIN BOLUS FROM INFUSION: 500.0000 mL | INTRAVENOUS | Status: DC

## 2013-03-17 MED ORDER — LACTATED RINGERS IV SOLN
INTRAVENOUS | Status: DC
  Administered 2013-03-17 – 2013-03-18 (×2): via INTRAVENOUS

## 2013-03-17 MED ORDER — ONDANSETRON HCL 4 MG/2ML IJ SOLN: 4.0000 mg | Freq: Four times a day (QID) | INTRAMUSCULAR | Status: DC | PRN

## 2013-03-17 MED ORDER — OXYCODONE-ACETAMINOPHEN 5-325 MG PO TABS: 1.0000 | ORAL_TABLET | ORAL | Status: DC | PRN

## 2013-03-17 MED ORDER — NALBUPHINE HCL 10 MG/ML IJ SOLN
10.0000 mg | INTRAMUSCULAR | Status: DC | PRN
  Administered 2013-03-18: 10 mg via INTRAVENOUS
  Filled 2013-03-17: qty 1

## 2013-03-17 NOTE — MAU Note (Signed)
C/o waking up with wet underwear and leaking on&off all day;  ?SROM; c/o ucs all day also;

## 2013-03-17 NOTE — MAU Note (Signed)
Pt reports contractions every day, today started leaking fluid at 1030. Has continued to leak off/on. Denies problems with pregnancy.

## 2013-03-17 NOTE — H&P (Signed)
Katie Reed is a 33 y.o. female [phone] at [redacted]w[redacted]d (dated by LMP and verified by 18wk [place]) who presented to MAU c/o leakage of fluids starting at 11 a.m.  Described as sudden onset of cloudy liquid ("not clear") running down legs.  Leakage has continued; multiple pads since 2 p.m.  Some contractions x 1 week - more at night.  Denies vaginal bleeding or discharge.  Positive fetal movement.  Denies pregnancy, labor or delivery complications in previous 3 - all vaginal deliveries.  Uneventful prenatal course thus far per patient.  Sparse PNC.  Maternal Medical History:  Reason for admission: Rupture of membranes and contractions.  Nausea.  Contractions: Onset was 1 week ago.   Frequency: irregular.    Fetal activity: Perceived fetal activity is normal.    Prenatal complications: Substance abuse.   Prenatal Complications - Diabetes: none.    OB History   Grav Para Term Preterm Abortions TAB SAB Ect Mult Living   4 3 3       3      Past Medical History  Diagnosis Date  . Anxiety   . Depression    Past Surgical History  Procedure Laterality Date  . No past surgeries     Family History: family history is negative for Alcohol abuse, Arthritis, Asthma, Birth defects, Cancer, COPD, Depression, Diabetes, Drug abuse, Early death, Hearing loss, Heart disease, Hyperlipidemia, Hypertension, Kidney disease, Learning disabilities, Mental illness, Mental retardation, Miscarriages / Stillbirths, Stroke, Vision loss, and Varicose Veins. Social History:  reports that she has been smoking Cigarettes.  She has been smoking about 0.25 packs per day. She has never used smokeless tobacco. She reports that she does not drink alcohol or use illicit drugs.   Prenatal Transfer Tool  Maternal Diabetes: No (1hr = 97) Genetic Screening: Normal (quad screen = negative) Maternal Ultrasounds/Referrals: Normal Fetal Ultrasounds or other Referrals:  None Maternal Substance Abuse:  No Significant Maternal  Medications:  Meds include: Prozac Significant Maternal Lab Results:  None Other Comments:  Sparse PNC, +Alcohol Use, +Tobacco Use, hx of depression  Review of Systems  Constitutional: Negative for fever and chills.  Respiratory: Negative for cough and shortness of breath.   Cardiovascular: Negative for chest pain.  Gastrointestinal: Positive for constipation. Negative for nausea, vomiting and abdominal pain.  Genitourinary: Negative for dysuria.  Skin: Negative for rash.  Neurological: Positive for dizziness (intermittent) and headaches (intermittent).      Blood pressure 125/70, pulse 67, temperature 99.1 F (37.3 C), temperature source Oral, resp. rate 16, height 5\' 3"  (1.6 m), weight 83.462 kg (184 lb), last menstrual period 03/09/2012, SpO2 100.00%. Maternal Exam:  Uterine Assessment: Contraction strength is mild.  Contraction frequency is irregular.   Abdomen: Patient reports no abdominal tenderness. Fetal presentation: vertex  Introitus: Normal vulva. Normal vagina.  Vagina is negative for discharge.  Ferning test: positive.  Nitrazine test: not done. Amniotic fluid character: clear.  Pelvis: adequate for delivery.   Cervix: Cervix evaluated by sterile speculum exam and digital exam.     Fetal Exam Fetal Monitor Review: Mode: ultrasound.   Baseline rate: 140 bpm.  Variability: moderate (6-25 bpm).   Pattern: accelerations present and no decelerations.    Fetal State Assessment: Category I - tracings are normal.     Physical Exam  Nursing note and vitals reviewed. Constitutional: She is oriented to person, place, and time. She appears well-developed and well-nourished.  HENT:  Head: Normocephalic and atraumatic.  Cardiovascular: Normal rate, regular rhythm and normal  heart sounds.   Respiratory: Effort normal. No respiratory distress.  Genitourinary: Vagina normal and uterus normal. No vaginal discharge found.  Pooling of clear fluid in vaginal vault.   Neurological: She is alert and oriented to person, place, and time.  Skin: Skin is warm and dry. No rash noted.  Psychiatric: She has a normal mood and affect. Her behavior is normal.  Dilation: 4 Effacement (%): 50 Cervical Position: Posterior Station: -3 Presentation: Vertex  Prenatal labs: ABO, Rh: A/Positive/-- (09/11 0000) Antibody:  Negative Rubella: Immune (09/11 0000) RPR: Nonreactive (09/11 0000)  HBsAg: Negative (09/11 0000)  HIV: Non-reactive (09/11 0000)  GBS:  Pending  Assessment/Plan: ZNIYAH MIDKIFF is a 33 y.o. [id number] at [redacted]w[redacted]d who presented to [facility]U; admitted for PROM at [redacted]w[redacted]d.  Admit to L&D.  Pain control - epidural upon request.  GBS prophylaxis.  Expected mode of delivery is NSVD.  Ordering social work consult for hx of depression, +substance abuse.     Cresenciano Lick 03/17/2013, 8:59 PM  Beverlyn Roux, MD, MPH [facility] PGY-1 03/17/2013 9:20 PM  I have seen the patient with the resident/student and agree with the above.  Mathis Bud

## 2013-03-18 ENCOUNTER — Encounter (HOSPITAL_COMMUNITY): Payer: Self-pay | Admitting: *Deleted

## 2013-03-18 DIAGNOSIS — O4290 Premature rupture of membranes, unspecified as to length of time between rupture and onset of labor, unspecified weeks of gestation: Secondary | ICD-10-CM

## 2013-03-18 DIAGNOSIS — O99344 Other mental disorders complicating childbirth: Secondary | ICD-10-CM

## 2013-03-18 DIAGNOSIS — F1010 Alcohol abuse, uncomplicated: Secondary | ICD-10-CM

## 2013-03-18 LAB — RPR: RPR Ser Ql: NONREACTIVE

## 2013-03-18 MED ORDER — TERBUTALINE SULFATE 1 MG/ML IJ SOLN: 0.2500 mg | Freq: Once | INTRAMUSCULAR | Status: DC | PRN

## 2013-03-18 MED ORDER — ZOLPIDEM TARTRATE 5 MG PO TABS: 5.0000 mg | ORAL_TABLET | Freq: Every evening | ORAL | Status: DC | PRN

## 2013-03-18 MED ORDER — DIPHENHYDRAMINE HCL 25 MG PO CAPS
25.0000 mg | ORAL_CAPSULE | Freq: Four times a day (QID) | ORAL | Status: DC | PRN
Start: 2013-03-18 — End: 2013-03-19

## 2013-03-18 MED ORDER — BENZOCAINE-MENTHOL 20-0.5 % EX AERO: 1.0000 "application " | INHALATION_SPRAY | CUTANEOUS | Status: DC | PRN

## 2013-03-18 MED ORDER — SENNOSIDES-DOCUSATE SODIUM 8.6-50 MG PO TABS
2.0000 | ORAL_TABLET | ORAL | Status: DC
  Administered 2013-03-19: 2 via ORAL
  Filled 2013-03-18: qty 2

## 2013-03-18 MED ORDER — SIMETHICONE 80 MG PO CHEW: 80.0000 mg | CHEWABLE_TABLET | ORAL | Status: DC | PRN

## 2013-03-18 MED ORDER — ONDANSETRON HCL 4 MG/2ML IJ SOLN: 4.0000 mg | INTRAMUSCULAR | Status: DC | PRN

## 2013-03-18 MED ORDER — IBUPROFEN 600 MG PO TABS
600.0000 mg | ORAL_TABLET | Freq: Four times a day (QID) | ORAL | Status: DC
  Administered 2013-03-18 – 2013-03-19 (×4): 600 mg via ORAL
  Filled 2013-03-18 (×4): qty 1

## 2013-03-18 MED ORDER — LANOLIN HYDROUS EX OINT: TOPICAL_OINTMENT | CUTANEOUS | Status: DC | PRN

## 2013-03-18 MED ORDER — OXYTOCIN 40 UNITS IN LACTATED RINGERS INFUSION - SIMPLE MED
1.0000 m[IU]/min | INTRAVENOUS | Status: DC
  Administered 2013-03-18: 1 m[IU]/min via INTRAVENOUS

## 2013-03-18 MED ORDER — PRENATAL MULTIVITAMIN CH
1.0000 | ORAL_TABLET | Freq: Every day | ORAL | Status: DC
  Administered 2013-03-19: 1 via ORAL
  Filled 2013-03-18: qty 1

## 2013-03-18 MED ORDER — OXYCODONE-ACETAMINOPHEN 5-325 MG PO TABS
1.0000 | ORAL_TABLET | ORAL | Status: DC | PRN
  Administered 2013-03-18 (×3): 1 via ORAL
  Filled 2013-03-18 (×3): qty 1

## 2013-03-18 MED ORDER — TETANUS-DIPHTH-ACELL PERTUSSIS 5-2.5-18.5 LF-MCG/0.5 IM SUSP: 0.5000 mL | Freq: Once | INTRAMUSCULAR | Status: DC

## 2013-03-18 MED ORDER — DIBUCAINE 1 % RE OINT
1.0000 | TOPICAL_OINTMENT | RECTAL | Status: DC | PRN
Start: 2013-03-18 — End: 2013-03-19

## 2013-03-18 MED ORDER — FENTANYL CITRATE 0.05 MG/ML IJ SOLN
100.0000 ug | INTRAMUSCULAR | Status: DC | PRN
  Administered 2013-03-18: 100 ug via INTRAVENOUS

## 2013-03-18 MED ORDER — WITCH HAZEL-GLYCERIN EX PADS
1.0000 | MEDICATED_PAD | CUTANEOUS | Status: DC | PRN
Start: 2013-03-18 — End: 2013-03-19

## 2013-03-18 MED ORDER — FENTANYL CITRATE 0.05 MG/ML IJ SOLN
INTRAMUSCULAR | Status: AC
  Filled 2013-03-18: qty 2

## 2013-03-18 MED ORDER — ONDANSETRON HCL 4 MG PO TABS: 4.0000 mg | ORAL_TABLET | ORAL | Status: DC | PRN

## 2013-03-18 NOTE — Progress Notes (Signed)
UR chart review completed.  

## 2013-03-18 NOTE — Progress Notes (Signed)
Katie Reed is a 33 y.o. [id number] at [redacted]w[redacted]d admitted for PPROM.  Subjective: Has been sleeping, feels contractions but not painful   Objective: BP 108/68  Pulse 67  Temp(Src) 98.7 F (37.1 C) (Oral)  Resp 18  Ht 5\' 3"  (1.6 m)  Wt 83.462 kg (184 lb)  BMI 32.60 kg/m2  SpO2 100%  LMP 03/09/2012      FHT:  FHR: 135 bpm, variability: moderate,  accelerations:  Abscent,  decelerations:  Absent UC:   irregular, every 5-10 minutes SVE:   Dilation: 4.5 Effacement (%): 60 Station: -2 Exam by:: ansah-mensah, rnc  Labs: Lab Results  Component Value Date   WBC 8.1 03/17/2013   HGB 10.1* 03/17/2013   HCT 31.3* 03/17/2013   MCV 81.9 03/17/2013   PLT 433* 03/17/2013    Assessment / Plan: Augmentation of labor, progressing well on pitocin  Labor: Progressing normally Preeclampsia:  no signs or symptoms of toxicity Fetal Wellbeing:  Category II  Pain Control:  Epidural I/D:  On penicillin for GBS prophylaxis Anticipated MOD:  NSVD  Beverlyn Roux 03/18/2013, 2:24 AM

## 2013-03-18 NOTE — Lactation Note (Signed)
This note was copied from the chart of Katie Reed. Lactation Consultation Note  Patient Name: Katie Reed [id number] Date: 03/18/2013 Reason for consult: Initial assessment;Other (Comment) (charting for exclusion)   Maternal Data Formula Feeding for Exclusion: Yes Reason for exclusion: Mother's choice to formula feed on admision  Feeding Feeding Type: Bottle Fed - Formula  LATCH Score/Interventions                      Lactation Tools Discussed/Used     Consult Status Consult Status: Complete    Bernita Buffy 03/18/2013, 3:25 PM

## 2013-03-18 NOTE — H&P (Signed)
Attestation of Attending Supervision of Advanced Practitioner (PA/CNM/NP): Evaluation and management procedures were performed by the Advanced Practitioner under my supervision and collaboration.  I have reviewed the Advanced Practitioner's note and chart, and I agree with the management and plan.  Donnamae Jude, MD [facility] Attending 03/18/2013 6:25 AM

## 2013-03-19 ENCOUNTER — Encounter (HOSPITAL_COMMUNITY): Payer: Medicaid Other | Admitting: Anesthesiology

## 2013-03-19 ENCOUNTER — Inpatient Hospital Stay (HOSPITAL_COMMUNITY): Payer: Medicaid Other | Admitting: Anesthesiology

## 2013-03-19 ENCOUNTER — Encounter (HOSPITAL_COMMUNITY)
Admission: AD | Disposition: A | Discharge status: Home / Self Care | Payer: Self-pay | Source: Ambulatory Visit | Attending: Obstetrics & Gynecology

## 2013-03-19 SURGERY — LIGATION, FALLOPIAN TUBE, POSTPARTUM
Anesthesia: Choice | Laterality: Bilateral

## 2013-03-19 MED ORDER — MIDAZOLAM HCL 2 MG/2ML IJ SOLN
INTRAMUSCULAR | Status: AC
  Filled 2013-03-19: qty 2

## 2013-03-19 MED ORDER — METOCLOPRAMIDE HCL 10 MG PO TABS: 10.0000 mg | ORAL_TABLET | Freq: Once | ORAL | Status: DC

## 2013-03-19 MED ORDER — IBUPROFEN 600 MG PO TABS: 600.0000 mg | ORAL_TABLET | Freq: Four times a day (QID) | ORAL | Status: DC

## 2013-03-19 MED ORDER — PHENYLEPHRINE 40 MCG/ML (10ML) SYRINGE FOR IV PUSH (FOR BLOOD PRESSURE SUPPORT)
PREFILLED_SYRINGE | INTRAVENOUS | Status: AC
  Filled 2013-03-19: qty 5

## 2013-03-19 MED ORDER — LACTATED RINGERS IV SOLN: INTRAVENOUS | Status: DC

## 2013-03-19 MED ORDER — DEXAMETHASONE SODIUM PHOSPHATE 10 MG/ML IJ SOLN
INTRAMUSCULAR | Status: AC
  Filled 2013-03-19: qty 1

## 2013-03-19 MED ORDER — FAMOTIDINE 20 MG PO TABS
40.0000 mg | ORAL_TABLET | Freq: Once | ORAL | Status: AC
  Administered 2013-03-19: 40 mg via ORAL
  Filled 2013-03-19: qty 2

## 2013-03-19 MED ORDER — ONDANSETRON HCL 4 MG/2ML IJ SOLN
INTRAMUSCULAR | Status: AC
  Filled 2013-03-19: qty 2

## 2013-03-19 MED ORDER — LIDOCAINE IN DEXTROSE 5-7.5 % IV SOLN
INTRAVENOUS | Status: AC
  Filled 2013-03-19: qty 2

## 2013-03-19 MED ORDER — FENTANYL CITRATE 0.05 MG/ML IJ SOLN
INTRAMUSCULAR | Status: AC
  Filled 2013-03-19: qty 2

## 2013-03-19 MED ORDER — METOCLOPRAMIDE HCL 10 MG PO TABS
10.0000 mg | ORAL_TABLET | Freq: Once | ORAL | Status: DC
  Filled 2013-03-19: qty 1

## 2013-03-19 MED ORDER — BUPIVACAINE HCL (PF) 0.25 % IJ SOLN
INTRAMUSCULAR | Status: AC
  Filled 2013-03-19: qty 30

## 2013-03-19 MED ORDER — FAMOTIDINE 20 MG PO TABS: 40.0000 mg | ORAL_TABLET | Freq: Once | ORAL | Status: DC

## 2013-03-19 MED ORDER — EPHEDRINE 5 MG/ML INJ
INTRAVENOUS | Status: AC
  Filled 2013-03-19: qty 10

## 2013-03-19 SURGICAL SUPPLY — 18 items
CHLORAPREP W/TINT 26ML (MISCELLANEOUS) IMPLANT
CONTAINER PREFILL 10% NBF 15ML (MISCELLANEOUS) IMPLANT
GLOVE BIOGEL PI IND STRL 6.5 (GLOVE) IMPLANT
GLOVE BIOGEL PI INDICATOR 6.5 (GLOVE)
GLOVE SURG SS PI 6.0 STRL IVOR (GLOVE) IMPLANT
GOWN PREVENTION PLUS LG XLONG (DISPOSABLE) IMPLANT
NEEDLE HYPO 25X1 1.5 SAFETY (NEEDLE) IMPLANT
NS IRRIG 1000ML POUR BTL (IV SOLUTION) IMPLANT
PACK ABDOMINAL MINOR (CUSTOM PROCEDURE TRAY) IMPLANT
SPONGE LAP 4X18 X RAY DECT (DISPOSABLE) IMPLANT
SUT PLAIN 0 NONE (SUTURE) IMPLANT
SUT VIC AB 0 CT1 27 (SUTURE)
SUT VIC AB 0 CT1 27XBRD ANBCTR (SUTURE) IMPLANT
SUT VIC AB 3-0 PS2 18 (SUTURE) IMPLANT
SYR CONTROL 10ML LL (SYRINGE) IMPLANT
TOWEL OR 17X24 6PK STRL BLUE (TOWEL DISPOSABLE) IMPLANT
TRAY FOLEY CATH 14FR (SET/KITS/TRAYS/PACK) IMPLANT
WATER STERILE IRR 1000ML POUR (IV SOLUTION) IMPLANT

## 2013-03-19 NOTE — Clinical Social Work Maternal (Signed)
Clinical Social Work Department PSYCHOSOCIAL ASSESSMENT - MATERNAL/CHILD 03/19/2013  Patient:  Katie Reed, Katie Reed  Account Number:  [id number]  Admit Date:  03/17/2013  Katie Reed Name:   Katie Reed    Clinical Social Worker:  Gerri Spore, LCSW   Date/Time:  03/19/2013 02:21 PM  Date Referred:  03/19/2013   Referral source  CN     Referred reason  Substance Abuse  Depression/Anxiety   Other referral source:    I:  FAMILY / [place] legal guardian:  PARENT  Guardian - Name Guardian - Age Guardian - Address  Katie Reed 32 [address] [place], [place] [address]  Katie Reed 32    Other household support members/support persons Name Relationship DOB   DAUGHTER 08/28/2001   DAUGHTER 05/05/2008   Other support:   Katie Reed, pt's sister    II  PSYCHOSOCIAL DATA Information Source:  Patient Interview  Financial and Community Resources Employment:   Financial resources:  [employer] [place]:  [employer] / Grade:   Maternity Care Coordinator / Child Services Coordination / Early Interventions:   Katie Reed  Cultural issues impacting care:    III  STRENGTHS Strengths  Adequate Resources  Home prepared for Child (including basic supplies)  Supportive family/friends   Strength comment:    IV  RISK FACTORS AND CURRENT PROBLEMS Current Problem:  YES   Risk Factor & Current Problem Patient Issue Family Issue Risk Factor / Current Problem Comment  Mental Illness Y N "Mild" depression/anxiety  Substance Abuse Y N Etoh use    V  SOCIAL WORK ASSESSMENT CSW met with pt to assess her current social situation & offer resources as needed.  Pt is a 33 year old, G5P4 who lives alone with her 2 younger daughters.  Pt admits to feeling depressed prior to pregnancy because she is a single parent "doing everything alone."  She coped with depressed feeling by drinking beer,  "heavily," per pt. Once pregnancy was confirmed, she became even more depressed & sought mental health treatment.  She started talking to Katie Bruce, LCSW at the health department for brief counseling sessions.  According to pt, she met with Katie Reed twice a month & thinks sessions were helpful.  She was prescribed Prozac, of which she also states is helping her depressed/anxious moods.  Per chart review, pt admits to drinking one bottle of wine daily however pt states information is incorrect.  Pt admits to drinking 2-3 glasses of wine a day early in the pregnancy.  Pt told CSW that she would drink when depressed.  She continued to drink wine until her 4th month of pregnancy, per pt.  She denies any use of liquor or beer during pregnancy.  Pt states she was able to stop drinking Etoh once the anti-depressant starting working (2nd trimester).  She denies any illegal substance use & verbalized understanding of hospital drug testing policy.  UDS is negative, meconium results are pending.  Pt admits to previous involvement with CPS 2-3 years ago.  Currently, pt denies any depressed or anxious moods.  She denies any history of SI/HI.  CSW dicussed PP depression signs/symptoms with pt & encouraged her to seek medical attention if symptoms arise.  Pt appears receptive to information discussed.  Pt not willing to discuss FOB's level of support.  She identifies her sister as her primary support person.  Pt plans to continue to see Katie Reed  for counseling services & take medication regularly.  CSW will continue to monitor drug screen results & make a referral if needed.         VI SOCIAL WORK PLAN Social Work Plan  No Further Intervention Required / No Barriers to Discharge   Type of pt/family education:   If child protective services report - county:   If child protective services report - date:   Information/referral to community resources comment:   Other social work plan:

## 2013-03-19 NOTE — Progress Notes (Signed)
Following counseling by anesthesia in pre-op testing area, patient opted to cancel the procedure and return for tubal ligation under general anesthesia. Patient is very fearful of having a spinal needle inserted in her back. Discussed with patient the minimal discomfort associated with postpartum tubal ligation and the fact that it will not delay her discharge. Also informed patient that laparoscopic salpingectomy will have to be scheduled at a later date and prior to expiration of Medicaid. Informed patient of associated recovery time, as well as child care coordination needed to plan around outpatient procedure. Patient verbalized understanding and realizes the benefits of having the procedure done today but her fear of needles is preventing her to move forward. Patient desires to be discharged today.

## 2013-03-19 NOTE — Discharge Summary (Signed)
Obstetric Discharge Summary Reason for Admission: rupture of membranes. Patient presented at 35 weeks with spontaneous rupture of membrane. Patient had an uncomplicated vaginal delivery without epidural or vaginal tears. Patient received her care at the health department which was complicated by h/o postpartum depression and alcohol abuse. Prenatal Procedures: none Intrapartum Procedures: spontaneous vaginal delivery Postpartum Procedures: none. Patient declined ppBTL secondary to fear of spinal needle. Patient plans to return for postpartum visit and to schedule laparoscopic salpingectomy under general anesthesia. Complications-Operative and Postpartum: none Hemoglobin  Date Value Range Status  03/17/2013 10.1* 12.0 - 15.0 g/dL Final     HCT  Date Value Range Status  03/17/2013 31.3* 36.0 - 46.0 % Final    Physical Exam:  General: alert, cooperative and no distress Lochia: appropriate Uterine Fundus: firm, NT DVT Evaluation: No cords or calf tenderness. No significant calf/ankle edema.  Discharge Diagnoses: premature preterm rupture of membrane with spontaneous vaginal delivery of female infant Depression well controlled on Prozac  Discharge Information: Date: 03/19/2013 Activity: unrestricted Diet: routine Medications: PNV and Ibuprofen Condition: stable Instructions: refer to practice specific booklet Discharge to: home Follow-up Information   Follow up with [facility] On 05/14/2013. (at 3:15 pm for your postpartum visit and to schedule your tubal ligation)    Specialty:  Obstetrics and Gynecology   Contact information:   [place] [address] [phone]      Newborn Data: Live born female  Birth Weight: 5 lb 2.5 oz (2340 g) APGAR: 8, 9  Home with mother.  Katie Reed 03/19/2013, 9:50 AM

## 2013-03-19 NOTE — Progress Notes (Signed)
Post Partum Day 0 (Deliver 0145 this am) Subjective: Pt reports a small blood clot expulsed this am with bleeding consistent with a period amount. No other complaints.  Objective: Blood pressure 99/65, pulse 70, temperature 97.4 F (36.3 C), temperature source Oral, resp. rate 18, height 5\' 3"  (1.6 m), weight 83.462 kg (184 lb), last menstrual period 03/09/2012, SpO2 98.00%, unknown if currently breastfeeding.  Physical Exam:  General: alert, cooperative and appears stated age Lochia: appropriate + clot Uterine Fundus: firm Incision: n/a DVT Evaluation: No evidence of DVT seen on physical exam.   Recent Labs  03/17/13 2145  HGB 10.1*  HCT 31.3*    Assessment/Plan: Plan for discharge tomorrow.  Understands the BTL procedure scheduled for today and has no questions.   LOS: 2 days   Aseneth Hack 03/19/2013, 7:48 AM

## 2013-03-19 NOTE — Progress Notes (Signed)
Patient ID: Katie Reed, female   DOB: 05-14-1980, 33 y.o.   MRN: [id number]  33 y.o. yo [id number]  with undesired fertility,status post vaginal delivery, desires permanent sterilization. Risks and benefits of procedure discussed with patient including permanence of method, bleeding, infection, injury to surrounding organs and need for additional procedures. Risk failure of 0.5-1% with increased risk of ectopic gestation if pregnancy occurs was also discussed with patient. Patient is very nervous regarding spinal anesthesia. Reassurance provided and I advised the patient to discuss all her concerns with the anesthesiologist.

## 2013-03-19 NOTE — Anesthesia Preprocedure Evaluation (Signed)
Anesthesia Evaluation  Patient identified by MRN, date of birth, ID band Patient awake    Reviewed: Allergy & Precautions, H&P , Patient's Chart, lab work & pertinent test results  Airway Mallampati: II TM Distance: >3 FB Neck ROM: full    Dental no notable dental hx.    Pulmonary Current Smoker,  breath sounds clear to auscultation  Pulmonary exam normal       Cardiovascular Exercise Tolerance: Good Rhythm:regular Rate:Normal     Neuro/Psych    GI/Hepatic   Endo/Other    Renal/GU      Musculoskeletal   Abdominal   Peds  Hematology   Anesthesia Other Findings   Reproductive/Obstetrics                           Anesthesia Physical Anesthesia Plan  ASA: II  Anesthesia Plan: Spinal   Post-op Pain Management:    Induction:   Airway Management Planned:   Additional Equipment:   Intra-op Plan:   Post-operative Plan:   Informed Consent: I have reviewed the patients History and Physical, chart, labs and discussed the procedure including the risks, benefits and alternatives for the proposed anesthesia with the patient or authorized representative who has indicated his/her understanding and acceptance.   Dental Advisory Given  Plan Discussed with: CRNA  Anesthesia Plan Comments: (Lab work confirmed with CRNA in room. Platelets okay. Discussed spinal anesthetic, and patient consents to the procedure:  included risk of possible headache,backache, failed block, allergic reaction, and nerve injury. This patient was asked if she had any questions or concerns before the procedure started. )        Anesthesia Quick Evaluation

## 2013-03-19 NOTE — Discharge Instructions (Signed)
Diagnostic Laparoscopy Laparoscopy is a surgical procedure. It is used to diagnose and treat diseases inside the belly (abdomen). It is usually a brief, common, and relatively simple procedure. The laparoscopeis a thin, lighted, pencil-sized instrument. It is like a telescope. It is inserted into your abdomen through a small cut (incision). Your caregiver can look at the organs inside your body through this instrument. He or she can see if there is anything abnormal. Laparoscopy can be done either in a hospital or outpatient clinic. You may be given a mild sedative to help you relax before the procedure. Once in the operating room, you will be given a drug to make you sleep (general anesthesia). Laparoscopy usually lasts less than 1 hour. After the procedure, you will be monitored in a recovery area until you are stable and doing well. Once you are home, it will take 2 to 3 days to fully recover. RISKS AND COMPLICATIONS  Laparoscopy has relatively few risks. Your caregiver will discuss the risks with you before the procedure. Some problems that can occur include:  Infection.  Bleeding.  Damage to other organs.  Anesthetic side effects. PROCEDURE Once you receive anesthesia, your surgeon inflates the abdomen with a harmless gas (carbon dioxide). This makes the organs easier to see. The laparoscope is inserted into the abdomen through a small incision. This allows your surgeon to see into the abdomen. Other small instruments are also inserted into the abdomen through other small openings. Many surgeons attach a video camera to the laparoscope to enlarge the view. During a diagnostic laparoscopy, the surgeon may be looking for inflammation, infection, or cancer. Your surgeon may take tissue samples(biopsies). The samples are sent to a specialist in looking at cells and tissue samples (pathologist). The pathologist examines them under a microscope. Biopsies can help to diagnose or confirm a  disease. AFTER THE PROCEDURE   The gas is released from inside the abdomen.  The incisions are closed with stitches (sutures). Because these incisions are small (usually less than 1/2 inch), there is usually minimal discomfort after the procedure. There may be some mild discomfort in the throat. This is from the tube placed in the throat while you were sleeping. You may have some mild abdominal discomfort. There may also be discomfort from the instrument placement incisions in the abdomen.  The recovery time is shortened as long as there are no complications.  You will rest in a recovery room until stable and doing well. As long as there are no complications, you may be allowed to go home. FINDING OUT THE RESULTS OF YOUR TEST Not all test results are available during your visit. If your test results are not back during the visit, make an appointment with your caregiver to find out the results. Do not assume everything is normal if you have not heard from your caregiver or the medical facility. It is important for you to follow up on all of your test results. HOME CARE INSTRUCTIONS   Take all medicines as directed.  Only take over-the-counter or prescription medicines for pain, discomfort, or fever as directed by your caregiver.  Resume daily activities as directed.  Showers are preferred over baths.  You may resume sexual activities in 1 week or as directed.  Do not drive while taking narcotics. SEEK MEDICAL CARE IF:   There is increasing abdominal pain.  There is new pain in the shoulders (shoulder strap areas).  You feel lightheaded or faint.  You have the chills.  You or  your child has an oral temperature above 102 F (38.9 C).  There is pus-like (purulent) drainage from any of the wounds.  You are unable to pass gas or have a bowel movement.  You feel sick to your stomach (nauseous) or throw up (vomit). MAKE SURE YOU:   Understand these instructions.  Will watch  your condition.  Will get help right away if you are not doing well or get worse. Document Released: 05/22/2000 Document Revised: 06/10/2012 Document Reviewed: 02/13/2007 [facility] Patient Information 2014 [place], [place].   Vaginal Delivery Care After Refer to this sheet in the next few weeks. These discharge instructions provide you with information on caring for yourself after delivery. Your caregiver may also give you specific instructions. Your treatment has been planned according to the most current medical practices available, but problems sometimes occur. Call your caregiver if you have any problems or questions after you go home. HOME CARE INSTRUCTIONS  Take over-the-counter or prescription medicines only as directed by your caregiver or pharmacist.  Do not drink alcohol, especially if you are breastfeeding or taking medicine to relieve pain.  Do not chew or smoke tobacco.  Do not use illegal drugs.  Continue to use good perineal care. Good perineal care includes:  Wiping your perineum from front to back.  Keeping your perineum clean.  Do not use tampons or douche until your caregiver says it is okay.  Shower, wash your hair, and take tub baths as directed by your caregiver.  Wear a well-fitting bra that provides breast support.  Eat healthy foods.  Drink enough fluids to keep your urine clear or pale yellow.  Eat high-fiber foods such as whole grain cereals and breads, brown rice, beans, and fresh fruits and vegetables every day. These foods may help prevent or relieve constipation.  Follow your cargiver's recommendations regarding resumption of activities such as climbing stairs, driving, lifting, exercising, or traveling.  Talk to your caregiver about resuming sexual activities. Resumption of sexual activities is dependent upon your risk of infection, your rate of healing, and your comfort and desire to resume sexual activity.  Try to have someone help you with  your household activities and your newborn for at least a few days after you leave the hospital.  Rest as much as possible. Try to rest or take a nap when your newborn is sleeping.  Increase your activities gradually.  Keep all of your scheduled postpartum appointments. It is very important to keep your scheduled follow-up appointments. At these appointments, your caregiver will be checking to make sure that you are healing physically and emotionally. SEEK MEDICAL CARE IF:   You are passing large clots from your vagina. Save any clots to show your caregiver.  You have a foul smelling discharge from your vagina.  You have trouble urinating.  You are urinating frequently.  You have pain when you urinate.  You have a change in your bowel movements.  You have increasing redness, pain, or swelling near your vaginal incision (episiotomy) or vaginal tear.  You have pus draining from your episiotomy or vaginal tear.  Your episiotomy or vaginal tear is separating.  You have painful, hard, or reddened breasts.  You have a severe headache.  You have blurred vision or see spots.  You feel sad or depressed.  You have thoughts of hurting yourself or your newborn.  You have questions about your care, the care of your newborn, or medicines.  You are dizzy or lightheaded.  You have a rash.  You have nausea or vomiting.  You were breastfeeding and have not had a menstrual period within 12 weeks after you stopped breastfeeding.  You are not breastfeeding and have not had a menstrual period by the 12th week after delivery.  You have a fever. SEEK IMMEDIATE MEDICAL CARE IF:   You have persistent pain.  You have chest pain.  You have shortness of breath.  You faint.  You have leg pain.  You have stomach pain.  Your vaginal bleeding saturates two or more sanitary pads in 1 hour. MAKE SURE YOU:   Understand these instructions.  Will watch your condition.  Will get help  right away if you are not doing well or get worse. Document Released: 02/11/2000 Document Revised: 11/08/2011 Document Reviewed: 10/11/2011 [facility] Patient Information 2014 [place].

## 2013-03-23 NOTE — Progress Notes (Signed)
I examined pt and agree with documentation above and PA-S plan of care. [facility]

## 2013-04-16 ENCOUNTER — Ambulatory Visit: Payer: Medicaid Other | Admitting: Obstetrics & Gynecology

## 2013-12-29 ENCOUNTER — Encounter (HOSPITAL_COMMUNITY): Payer: Self-pay | Admitting: *Deleted

## 2014-03-12 ENCOUNTER — Encounter (HOSPITAL_COMMUNITY): Payer: Self-pay | Admitting: Obstetrics and Gynecology

## 2014-03-27 ENCOUNTER — Emergency Department (HOSPITAL_COMMUNITY)
Admission: EM | Admit: 2014-03-27 | Discharge: 2014-03-27 | Disposition: A | Discharge status: Home / Self Care | Payer: Medicaid Other | Attending: Emergency Medicine | Admitting: Emergency Medicine

## 2014-03-27 ENCOUNTER — Encounter (HOSPITAL_COMMUNITY): Payer: Self-pay | Admitting: Physical Medicine and Rehabilitation

## 2014-03-27 DIAGNOSIS — F329 Major depressive disorder, single episode, unspecified: Secondary | ICD-10-CM | POA: Insufficient documentation

## 2014-03-27 DIAGNOSIS — K047 Periapical abscess without sinus: Secondary | ICD-10-CM | POA: Insufficient documentation

## 2014-03-27 DIAGNOSIS — F419 Anxiety disorder, unspecified: Secondary | ICD-10-CM | POA: Insufficient documentation

## 2014-03-27 DIAGNOSIS — K029 Dental caries, unspecified: Secondary | ICD-10-CM | POA: Insufficient documentation

## 2014-03-27 DIAGNOSIS — Z720 Tobacco use: Secondary | ICD-10-CM | POA: Insufficient documentation

## 2014-03-27 DIAGNOSIS — Z79899 Other long term (current) drug therapy: Secondary | ICD-10-CM | POA: Insufficient documentation

## 2014-03-27 MED ORDER — PENICILLIN V POTASSIUM 500 MG PO TABS: 500.0000 mg | ORAL_TABLET | Freq: Three times a day (TID) | ORAL | Status: DC

## 2014-03-27 MED ORDER — HYDROCODONE-ACETAMINOPHEN 5-325 MG PO TABS: 1.0000 | ORAL_TABLET | Freq: Four times a day (QID) | ORAL | Status: DC | PRN

## 2014-03-27 NOTE — ED Notes (Signed)
Pt reports R lower dental pain. Ongoing since Monday. 10/10 pain, also reports facial swelling. Pt is alert and oriented x4. NAD.

## 2014-03-27 NOTE — Discharge Instructions (Signed)
1. Medications: vicodin, penicillin, usual home medications 2. Treatment: rest, drink plenty of fluids, take medications as prescribed 3. Follow Up: Please followup with dentistry within 1 week for discussion of your diagnoses and further evaluation after today's visit; if you do not have a primary care doctor use the resource guide provided to find one; Return to the ER for high fevers, difficulty breathing, difficulty swallowing or other concerning symptoms

## 2014-03-27 NOTE — ED Provider Notes (Signed)
CSN: [id number]     Arrival date & time 03/27/14  1641 History  This chart was scribed for [name], PA-C with Wandra Arthurs, MD by Edison Simon, ED Scribe. This patient was seen in room TR06C/TR06C and the patient's care was started at 5:33 PM.    Chief Complaint  Patient presents with  . Dental Pain   The history is provided by the patient and medical records. No language interpreter was used.    HPI Comments: Katie Reed is a 34 y.o. female who presents to the Emergency Department complaining of dental pain to her right lower with onset 4 days ago. She states she has pain diffusely to her right cheek now. She states she has been using Ibuprofen and Aleve without significant improvement. She states she had a dentist and he had wanted to do a root canal previously, but she was pregnant at that time. She notes she no longer has Medicaid and was thus unable to see a dentist when this problem began. She states she has had all her wisdom teeth out. She states she is not pregnant now. She states she does not take any medications every day. She reports associated chills. She denies fever, chills, nausea, or vomiting.    Past Medical History  Diagnosis Date  . Anxiety   . Depression    Past Surgical History  Procedure Laterality Date  . No past surgeries     Family History  Problem Relation Age of Onset  . Alcohol abuse Neg Hx   . Arthritis Neg Hx   . Asthma Neg Hx   . Birth defects Neg Hx   . Cancer Neg Hx   . COPD Neg Hx   . Depression Neg Hx   . Diabetes Neg Hx   . Drug abuse Neg Hx   . Early death Neg Hx   . Hearing loss Neg Hx   . Heart disease Neg Hx   . Hyperlipidemia Neg Hx   . Hypertension Neg Hx   . Kidney disease Neg Hx   . Learning disabilities Neg Hx   . Mental illness Neg Hx   . Mental retardation Neg Hx   . Miscarriages / Stillbirths Neg Hx   . Stroke Neg Hx   . Vision loss Neg Hx   . Varicose Veins Neg Hx    History  Substance Use Topics  .  Smoking status: Current Some Day Smoker -- 0.25 packs/day    Types: Cigarettes  . Smokeless tobacco: Never Used  . Alcohol Use: No   OB History    Gravida Para Term Preterm AB TAB SAB Ectopic Multiple Living   5 4 3 1 1 1    4      Review of Systems  Constitutional: Negative for fever, chills and appetite change.  HENT: Positive for dental problem. Negative for drooling, ear pain, facial swelling, nosebleeds, postnasal drip, rhinorrhea and trouble swallowing.   Eyes: Negative for pain and redness.  Respiratory: Negative for cough and wheezing.   Cardiovascular: Negative for chest pain.  Gastrointestinal: Negative for nausea, vomiting and abdominal pain.  Musculoskeletal: Negative for neck pain and neck stiffness.  Skin: Negative for color change and rash.  Neurological: Positive for headaches (throbbing). Negative for weakness and light-headedness.  All other systems reviewed and are negative.     Allergies  Review of patient's allergies indicates no known allergies.  Home Medications   Prior to Admission medications   Medication Sig Start Date End  Date Taking? Authorizing Provider  FLUoxetine (PROZAC) 10 MG capsule Take 10 mg by mouth daily.    Historical Provider, MD  HYDROcodone-acetaminophen (NORCO/VICODIN) 5-325 MG per tablet Take 1-2 tablets by mouth every 6 (six) hours as needed for moderate pain or severe pain. 03/27/14   Johnhenry Tippin, PA-C  ibuprofen (ADVIL,MOTRIN) 600 MG tablet Take 1 tablet (600 mg total) by mouth every 6 (six) hours. 03/19/13   Peggy Constant, MD  penicillin v potassium (VEETID) 500 MG tablet Take 1 tablet (500 mg total) by mouth 3 (three) times daily. 03/27/14   Jong Rickman, PA-C  Prenatal Vit-Fe Fumarate-FA (PRENATAL MULTIVITAMIN) TABS tablet Take 1 tablet by mouth daily at 12 noon.    Historical Provider, MD   BP 113/74 mmHg  Pulse 61  Temp(Src) 98.1 F (36.7 C) (Oral)  Resp 18  SpO2 100% Physical Exam  Constitutional: She  appears well-developed and well-nourished.  HENT:  Head: Normocephalic.  Right Ear: Tympanic membrane, external ear and ear canal normal.  Left Ear: Tympanic membrane, external ear and ear canal normal.  Nose: Nose normal. Right sinus exhibits no maxillary sinus tenderness and no frontal sinus tenderness. Left sinus exhibits no maxillary sinus tenderness and no frontal sinus tenderness.  Mouth/Throat: Uvula is midline, oropharynx is clear and moist and mucous membranes are normal. No oral lesions. Abnormal dentition. Dental caries present. No uvula swelling or lacerations. No oropharyngeal exudate, posterior oropharyngeal edema, posterior oropharyngeal erythema or tonsillar abscesses.  No gingival swelling, fluctuance or induration No gross abscess Pain to palpation of tooth #30 with small crack noted  Eyes: Conjunctivae are normal. Pupils are equal, round, and reactive to light. Right eye exhibits no discharge. Left eye exhibits no discharge.  Neck: Normal range of motion. Neck supple.  No stridor Handling secretions without difficulty No nuchal rigidity No cervical lymphadenopathy   Cardiovascular: Normal rate, regular rhythm, normal heart sounds and intact distal pulses.   No murmur heard. Pulmonary/Chest: Effort normal and breath sounds normal. No respiratory distress.  Equal chest rise  Abdominal: Soft. Bowel sounds are normal. She exhibits no distension. There is no tenderness.  Lymphadenopathy:       Head (right side): Submandibular ( tender, mobile, discrete) adenopathy present. No submental, no tonsillar, no preauricular, no posterior auricular and no occipital adenopathy present.       Head (left side): No submental, no submandibular, no tonsillar, no preauricular, no posterior auricular and no occipital adenopathy present.    She has no cervical adenopathy.       Right cervical: No superficial cervical, no deep cervical and no posterior cervical adenopathy present.      Left  cervical: No superficial cervical, no deep cervical and no posterior cervical adenopathy present.  Neurological: She is alert.  Skin: Skin is warm and dry. No erythema.  Psychiatric: She has a normal mood and affect.  Nursing note and vitals reviewed.   ED Course  Procedures (including critical care time)  DIAGNOSTIC STUDIES: Oxygen Saturation is 99% on room air, normal by my interpretation.    COORDINATION OF CARE: 5:37 PM Discussed treatment plan with patient at beside, including dental block and referral to dentist. The patient agrees with the plan and has no further questions at this time.  5:44 PM Dental block administered.   Labs Review Labs Reviewed - No data to display  Imaging Review No results found.   EKG Interpretation None      MDM   Final diagnoses:  Pain due to dental  caries  Dental infection   Lawernce Pitts presents with dental pain for 3 days.  Patient with toothache.  No gross abscess.  Exam unconcerning for Ludwig's angina or spread of infection.  Will treat with penicillin and pain medicine.  Urged patient to follow-up with dentist.    I have personally reviewed patient's vitals, nursing note and any pertinent labs or imaging.  I performed an undressed physical exam.    It has been determined that no acute conditions requiring further emergency intervention are present at this time. The patient/guardian have been advised of the diagnosis and plan. I reviewed all labs and imaging including any potential incidental findings. We have discussed signs and symptoms that warrant return to the ED and they are listed in the discharge instructions.    Vital signs are stable at discharge.   BP 113/74 mmHg  Pulse 61  Temp(Src) 98.1 F (36.7 C) (Oral)  Resp 18  SpO2 100%  I personally performed the services described in this documentation, which was scribed in my presence. The recorded information has been reviewed and is accurate.        Jarrett Soho  Lenwood Balsam, PA-C 03/27/14 1755  Wandra Arthurs, MD 03/27/14 2351

## 2014-08-06 ENCOUNTER — Encounter (HOSPITAL_COMMUNITY): Payer: Self-pay | Admitting: Obstetrics and Gynecology

## 2014-12-17 ENCOUNTER — Encounter (HOSPITAL_COMMUNITY): Payer: Self-pay | Admitting: *Deleted

## 2014-12-17 ENCOUNTER — Inpatient Hospital Stay (HOSPITAL_COMMUNITY)
Admission: AD | Admit: 2014-12-17 | Discharge: 2014-12-17 | Disposition: A | Discharge status: Home / Self Care | Payer: Medicaid Other | Source: Ambulatory Visit | Attending: Obstetrics & Gynecology | Admitting: Obstetrics & Gynecology

## 2014-12-17 DIAGNOSIS — O26892 Other specified pregnancy related conditions, second trimester: Secondary | ICD-10-CM | POA: Diagnosis not present

## 2014-12-17 DIAGNOSIS — R197 Diarrhea, unspecified: Secondary | ICD-10-CM | POA: Insufficient documentation

## 2014-12-17 DIAGNOSIS — Z3A23 23 weeks gestation of pregnancy: Secondary | ICD-10-CM | POA: Diagnosis not present

## 2014-12-17 DIAGNOSIS — O99332 Smoking (tobacco) complicating pregnancy, second trimester: Secondary | ICD-10-CM | POA: Diagnosis not present

## 2014-12-17 DIAGNOSIS — O9989 Other specified diseases and conditions complicating pregnancy, childbirth and the puerperium: Secondary | ICD-10-CM

## 2014-12-17 DIAGNOSIS — F17219 Nicotine dependence, cigarettes, with unspecified nicotine-induced disorders: Secondary | ICD-10-CM | POA: Insufficient documentation

## 2014-12-17 DIAGNOSIS — R109 Unspecified abdominal pain: Secondary | ICD-10-CM

## 2014-12-17 DIAGNOSIS — O26899 Other specified pregnancy related conditions, unspecified trimester: Secondary | ICD-10-CM

## 2014-12-17 LAB — URINALYSIS, ROUTINE W REFLEX MICROSCOPIC
Bilirubin Urine: NEGATIVE
GLUCOSE, UA: NEGATIVE mg/dL
Hgb urine dipstick: NEGATIVE
Ketones, ur: NEGATIVE mg/dL
LEUKOCYTES UA: NEGATIVE
Nitrite: NEGATIVE
PH: 6.5 (ref 5.0–8.0)
PROTEIN: NEGATIVE mg/dL
Specific Gravity, Urine: 1.015 (ref 1.005–1.030)
Urobilinogen, UA: 0.2 mg/dL (ref 0.0–1.0)

## 2014-12-17 LAB — OB RESULTS CONSOLE RPR: RPR: NONREACTIVE

## 2014-12-17 LAB — CBC
HEMATOCRIT: 29.8 % — AB (ref 36.0–46.0)
Hemoglobin: 10.1 g/dL — ABNORMAL LOW (ref 12.0–15.0)
MCH: 30.1 pg (ref 26.0–34.0)
MCHC: 33.9 g/dL (ref 30.0–36.0)
MCV: 89 fL (ref 78.0–100.0)
Platelets: 301 10*3/uL (ref 150–400)
RBC: 3.35 MIL/uL — ABNORMAL LOW (ref 3.87–5.11)
RDW: 13.5 % (ref 11.5–15.5)
WBC: 8.8 10*3/uL (ref 4.0–10.5)

## 2014-12-17 LAB — OB RESULTS CONSOLE HIV ANTIBODY (ROUTINE TESTING)
HIV: NONREACTIVE
HIV: NONREACTIVE

## 2014-12-17 LAB — COMPREHENSIVE METABOLIC PANEL
ALBUMIN: 3 g/dL — AB (ref 3.5–5.0)
ALK PHOS: 44 U/L (ref 38–126)
ALT: 27 U/L (ref 14–54)
ANION GAP: 5 (ref 5–15)
AST: 19 U/L (ref 15–41)
BILIRUBIN TOTAL: 0.3 mg/dL (ref 0.3–1.2)
BUN: 11 mg/dL (ref 6–20)
CO2: 22 mmol/L (ref 22–32)
Calcium: 8.6 mg/dL — ABNORMAL LOW (ref 8.9–10.3)
Chloride: 106 mmol/L (ref 101–111)
Creatinine, Ser: 0.51 mg/dL (ref 0.44–1.00)
GFR calc Af Amer: >60 mL/min (ref >60)
GFR calc non Af Amer: >60 mL/min (ref >60)
Glucose, Bld: 92 mg/dL (ref 65–99)
Potassium: 3.6 mmol/L (ref 3.5–5.1)
Sodium: 133 mmol/L — ABNORMAL LOW (ref 135–145)
TOTAL PROTEIN: 6.6 g/dL (ref 6.5–8.1)

## 2014-12-17 LAB — OB RESULTS CONSOLE HGB/HCT, BLOOD
HCT: 34 %
HEMATOCRIT: 34 %
HEMOGLOBIN: 11 g/dL
Hemoglobin: 11 g/dL

## 2014-12-17 LAB — OB RESULTS CONSOLE RUBELLA ANTIBODY, IGM: Rubella: IMMUNE

## 2014-12-17 LAB — OB RESULTS CONSOLE PLATELET COUNT
PLATELETS: 349 10*3/uL
Platelets: 349 10*3/uL

## 2014-12-17 LAB — OB RESULTS CONSOLE HEPATITIS B SURFACE ANTIGEN: Hepatitis B Surface Ag: NEGATIVE

## 2014-12-17 LAB — OB RESULTS CONSOLE ANTIBODY SCREEN: Antibody Screen: NEGATIVE

## 2014-12-17 LAB — OB RESULTS CONSOLE GC/CHLAMYDIA
CHLAMYDIA, DNA PROBE: NEGATIVE
Gonorrhea: NEGATIVE

## 2014-12-17 LAB — OB RESULTS CONSOLE ABO/RH: RH Type: POSITIVE

## 2014-12-17 MED ORDER — PROMETHAZINE HCL 12.5 MG PO TABS: 12.5000 mg | ORAL_TABLET | Freq: Four times a day (QID) | ORAL | Status: DC | PRN
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
## 2014-12-17 NOTE — MAU Provider Note (Signed)
History     CSN: [id number]  Arrival date and time: 12/17/14 0825   None     Chief Complaint  Patient presents with  . Abdominal Pain   HPI   [name] is a 34 y.o. female [id number] at [id number] with abdominal pain that came on abruptly this morning at 0630; the pain woke her up out of her sleep. The pain is sharp at times and other times cramp like. The pain is located at the top of her stomach. The pain feels like contraction pain.   [facility] She has a history of 1 preterm delivery due to PROM. No other complications. This has been a "healthy" pregnancy.   The patient has had 2 episodes of diarrhea since arrival; no vomiting.   + fetal movements   OB History    Gravida Para Term Preterm AB TAB SAB Ectopic Multiple Living   _0 Past Medical History  Diagnosis Date  . Anxiety   . Depression     Past Surgical History  Procedure Laterality Date  . No past surgeries    . Therapeutic abortion      elective    Family History  Problem Relation Age of Onset  . Alcohol abuse Neg Hx   . Arthritis Neg Hx   . Asthma Neg Hx   . Birth defects Neg Hx   . Cancer Neg Hx   . COPD Neg Hx   . Depression Neg Hx   . Diabetes Neg Hx   . Drug abuse Neg Hx   . Early death Neg Hx   . Hearing loss Neg Hx   . Heart disease Neg Hx   . Hyperlipidemia Neg Hx   . Hypertension Neg Hx   . Kidney disease Neg Hx   . Learning disabilities Neg Hx   . Mental illness Neg Hx   . Mental retardation Neg Hx   . Miscarriages / Stillbirths Neg Hx   . Stroke Neg Hx   . Vision loss Neg Hx   . Varicose Veins Neg Hx     Social History  Substance Use Topics  . Smoking status: Current Some Day Smoker -- 0.25 packs/day for 14 years    Types: Cigarettes  . Smokeless tobacco: Never Used  . Alcohol Use: Yes     Comment: last few days ago    Allergies: No Known Allergies  Prescriptions prior  to admission  Medication Sig Dispense Refill Last Dose  . Prenatal Vit-Fe Fumarate-FA (PRENATAL MULTIVITAMIN) TABS tablet Take 1 tablet by mouth daily at 12 noon.   12/15/2014  . [DISCONTINUED] HYDROcodone-acetaminophen (NORCO/VICODIN) 5-325 MG per tablet Take 1-2 tablets by mouth every 6 (six) hours as needed for moderate pain or severe pain. (Patient not taking: Reported on 12/17/2014) 15 tablet 0 Completed Course at Unknown time  . [DISCONTINUED] ibuprofen (ADVIL,MOTRIN) 600 MG tablet Take 1 tablet (600 mg total) by mouth every 6 (six) hours. (Patient not taking: Reported on 12/17/2014) 30 tablet 0   . [DISCONTINUED] penicillin v potassium (VEETID) 500 MG tablet Take 1 tablet (500 mg total) by mouth 3 (three) times daily. (Patient not taking: Reported on 12/17/2014) 30 tablet 0 Completed Course at Unknown time   Results for orders placed or performed during the hospital encounter of 12/17/14 (from the past 48 hour(s))  Urinalysis, Routine w reflex microscopic (  not at [facility])     Status: None   Collection Time: 12/17/14  9:27 AM  Result Value Ref Range   Color, Urine YELLOW YELLOW   APPearance CLEAR CLEAR   Specific Gravity, Urine 1.015 1.005 - 1.030   pH 6.5 5.0 - 8.0   Glucose, UA NEGATIVE NEGATIVE mg/dL   Hgb urine dipstick NEGATIVE NEGATIVE   Bilirubin Urine NEGATIVE NEGATIVE   Ketones, ur NEGATIVE NEGATIVE mg/dL   Protein, ur NEGATIVE NEGATIVE mg/dL   Urobilinogen, UA 0.2 0.0 - 1.0 mg/dL   Nitrite NEGATIVE NEGATIVE   Leukocytes, UA NEGATIVE NEGATIVE    Comment: MICROSCOPIC NOT Reed ON URINES WITH NEGATIVE PROTEIN, BLOOD, LEUKOCYTES, NITRITE, OR GLUCOSE <1000 mg/dL.  CBC     Status: Abnormal   Collection Time: 12/17/14 10:25 AM  Result Value Ref Range   WBC 8.8 4.0 - 10.5 K/uL   RBC 3.35 (L) 3.87 - 5.11 MIL/uL   Hemoglobin 10.1 (L) 12.0 - 15.0 g/dL   HCT 29.8 (L) 36.0 - 46.0 %   MCV 89.0 78.0 - 100.0 fL   MCH 30.1 26.0 - 34.0 pg   MCHC 33.9 30.0 - 36.0 g/dL   RDW 13.5 11.5 -  15.5 %   Platelets 301 150 - 400 K/uL  Comprehensive metabolic panel     Status: Abnormal   Collection Time: 12/17/14 10:25 AM  Result Value Ref Range   Sodium 133 (L) 135 - 145 mmol/L   Potassium 3.6 3.5 - 5.1 mmol/L   Chloride 106 101 - 111 mmol/L   CO2 22 22 - 32 mmol/L   Glucose, Bld 92 65 - 99 mg/dL   BUN 11 6 - 20 mg/dL   Creatinine, Ser 0.51 0.44 - 1.00 mg/dL   Calcium 8.6 (L) 8.9 - 10.3 mg/dL   Total Protein 6.6 6.5 - 8.1 g/dL   Albumin 3.0 (L) 3.5 - 5.0 g/dL   AST 19 15 - 41 U/L   ALT 27 14 - 54 U/L   Alkaline Phosphatase 44 38 - 126 U/L   Total Bilirubin 0.3 0.3 - 1.2 mg/dL   GFR calc non Af Amer >60 >60 mL/min   GFR calc Af Amer >60 >60 mL/min    Comment: (NOTE) The eGFR has been calculated using the CKD EPI equation. This calculation has not been validated in all clinical situations. eGFR's persistently <60 mL/min signify possible Chronic Kidney Disease.    Anion gap 5 5 - 15    Review of Systems  Constitutional: Negative for fever and chills.  Gastrointestinal: Positive for abdominal pain and diarrhea. Negative for heartburn, nausea, vomiting and constipation.  Genitourinary: Negative for dysuria.   Physical Exam   Blood pressure 96/45, pulse 85, temperature 98.3 F (36.8 C), temperature source Oral, resp. rate 18, unknown if currently breastfeeding.  Physical Exam  Constitutional: She is oriented to person, place, and time. She appears well-developed and well-nourished. No distress.  HENT:  Head: Normocephalic.  Eyes: Pupils are equal, round, and reactive to light.  Respiratory: Effort normal.  GI: Soft. She exhibits no distension. There is no tenderness. There is no rebound and no guarding.  Genitourinary:  Dilation: Closed Effacement (%): Thick Cervical Position: Posterior Exam by:: J Lavelle Akel Np  Musculoskeletal: Normal range of motion.  Neurological: She is alert and oriented to person, place, and time.  Skin: Skin is warm. She is not diaphoretic.   Psychiatric: Her behavior is normal.   Fetal Tracing: Baseline: 140 bpm  Variability: Moderate  Accelerations:  15x15 Decelerations: None Toco: None   MAU Course  Procedures  None  MDM Patient pushed PO fluids in MAU.   Assessment and Plan   A:  1. Abdominal pain in pregnancy   2. Diarrhea, unspecified type likely gastroenteritis    P:  Discharge home in stable condition RX: Phenergan Ok to take over the counter Imodium as directed on the bottle Return to MAU as needed, if symptoms worsen Follow up with the Health Department as scheduled.  Kick counts     Lezlie Lye, NP 12/17/2014 9:36 AM

## 2014-12-17 NOTE — MAU Note (Signed)
Had pain in upper thighs last night, started having abd pain when woke up this morning, ? Contractions.  No bleeding or leaking;

## 2014-12-18 LAB — OB RESULTS CONSOLE HGB/HCT, BLOOD: Hemoglobin: 9.7 g/dL

## 2014-12-21 ENCOUNTER — Other Ambulatory Visit (HOSPITAL_COMMUNITY): Payer: Self-pay | Admitting: Urology

## 2014-12-21 DIAGNOSIS — O09522 Supervision of elderly multigravida, second trimester: Secondary | ICD-10-CM

## 2014-12-21 DIAGNOSIS — Z3689 Encounter for other specified antenatal screening: Secondary | ICD-10-CM

## 2014-12-23 ENCOUNTER — Ambulatory Visit (HOSPITAL_COMMUNITY)
Admission: RE | Admit: 2014-12-23 | Discharge: 2014-12-23 | Disposition: A | Discharge status: Home / Self Care | Payer: Medicaid Other | Source: Ambulatory Visit | Attending: Obstetrics & Gynecology | Admitting: Obstetrics & Gynecology

## 2014-12-23 ENCOUNTER — Encounter (HOSPITAL_COMMUNITY): Payer: Self-pay

## 2014-12-23 ENCOUNTER — Other Ambulatory Visit (HOSPITAL_COMMUNITY): Payer: Self-pay | Admitting: Urology

## 2014-12-23 DIAGNOSIS — Z3A23 23 weeks gestation of pregnancy: Secondary | ICD-10-CM | POA: Insufficient documentation

## 2014-12-23 DIAGNOSIS — Z315 Encounter for genetic counseling: Secondary | ICD-10-CM | POA: Diagnosis not present

## 2014-12-23 DIAGNOSIS — O09522 Supervision of elderly multigravida, second trimester: Secondary | ICD-10-CM

## 2014-12-23 DIAGNOSIS — Z3689 Encounter for other specified antenatal screening: Secondary | ICD-10-CM

## 2014-12-23 DIAGNOSIS — Z36 Encounter for antenatal screening of mother: Secondary | ICD-10-CM | POA: Diagnosis not present

## 2014-12-23 NOTE — Progress Notes (Signed)
Appointment Date: 12/23/2014 DOB: [birth year] Referring Provider: [place] Attending: Dr. Benjaman Lobe  Katie Reed was seen for genetic counseling because of a maternal age of 34 y.o..   She was accompanied to the visit by her son.  In summary:  Discussed age related risk for aneuploidy  Discussed available screening and diagnostic testing options  Patient declined invasive testing and NIPS, but wanted to have ultrasound; she may reconsider NIPS when she has her medicaid card  She was counseled regarding maternal age and the association with risk for chromosome conditions due to nondisjunction with aging of the ova.   We reviewed chromosomes, nondisjunction, and the associated 1 in 141 risk for fetal aneuploidy related to a maternal age of 34 y.o. at [redacted]w[redacted]d gestation.  She was counseled that the risk for aneuploidy decreases as gestational age increases, accounting for those pregnancies which spontaneously abort.  We specifically discussed Down syndrome (trisomy 68), trisomies 54 and 73, and sex chromosome aneuploidies (47,XXX and 47,XXY) including the common features and prognoses of each.   We reviewed available screening options including noninvasive prenatal screening (NIPS)/cell free DNA (cfDNA) testing and detailed ultrasound.  She was counseled that screening tests are used to modify a patient's a priori risk for aneuploidy, typically based on age. This estimate provides a pregnancy specific risk assessment. We reviewed the benefits and limitations of each option. Specifically, we discussed the conditions for which each test screens, the detection rates, and false positive rates of each. She was also counseled regarding diagnostic testing via amniocentesis. We reviewed the approximate 1 in 130-865 risk for complications from amniocentesis, including spontaneous pregnancy loss. After consideration of all the options, she is considering NIPS, but preferred to wait  until she had her medicaid card.   A complete ultrasound was performed today. The ultrasound report will be sent under separate cover. There were no visualized fetal anomalies or markers suggestive of aneuploidy. Diagnostic testing was declined today as was screening.  She understand that screening tests cannot rule out all birth defects or genetic syndromes. The patient was advised of this limitation and states she still does not want additional testing at this time.   Katie Reed was provided with written information regarding sickle cell anemia (SCA) including the carrier frequency and incidence in the African-American population, the availability of carrier testing and prenatal diagnosis if indicated.  In addition, we discussed that hemoglobinopathies are routinely screened for as part of the [place] newborn screening panel.  She stated that she was certain that she did not have a hemoglobin variant and did not wish to discuss screening.  Both family histories were reviewed, however, Katie Reed stated that she does not know anything about her father's family history or the father of the baby's family history and was very resistant to discussing any information about family history at all.  She reports that it is noncontributory for birth defects, intellectual disability, and known genetic conditions. Without further information regarding the provided family history, an accurate genetic risk cannot be calculated. Further genetic counseling is warranted if more information is obtained.  Katie Reed denied exposure to environmental toxins or chemical agents. She denied the use of street drugs, but reported using alcohol as recently as 6 days ago.   When a discussion regarding alcohol use in pregnancy was attempted, she changed her story and denied having had any alcohol during her pregnancy.  She reported smoking 1-2 cigarettes per day.  She was encouraged in smoking  cessation. She denied significant viral  illnesses during the course of her pregnancy. Her medical and surgical histories were noncontributory.   I counseled Katie Reed regarding the above risks and available options.  The approximate face-to-face time with the genetic counselor was 35 minutes. Most of the counseling was provided by Shelbie Ammons, [employer] genetic counseling student, under my direct supervision.  Cam Hai, MS,  Certified Genetic Counselor

## 2014-12-31 ENCOUNTER — Encounter: Payer: Medicaid Other | Admitting: Family Medicine

## 2014-12-31 ENCOUNTER — Encounter: Payer: Self-pay | Admitting: Family Medicine

## 2014-12-31 ENCOUNTER — Encounter: Payer: Self-pay | Admitting: *Deleted

## 2014-12-31 DIAGNOSIS — O99210 Obesity complicating pregnancy, unspecified trimester: Secondary | ICD-10-CM

## 2014-12-31 DIAGNOSIS — O09529 Supervision of elderly multigravida, unspecified trimester: Secondary | ICD-10-CM

## 2014-12-31 DIAGNOSIS — O99810 Abnormal glucose complicating pregnancy: Secondary | ICD-10-CM

## 2014-12-31 DIAGNOSIS — F17200 Nicotine dependence, unspecified, uncomplicated: Secondary | ICD-10-CM

## 2014-12-31 DIAGNOSIS — O09219 Supervision of pregnancy with history of pre-term labor, unspecified trimester: Secondary | ICD-10-CM | POA: Insufficient documentation

## 2014-12-31 DIAGNOSIS — O0990 Supervision of high risk pregnancy, unspecified, unspecified trimester: Secondary | ICD-10-CM

## 2014-12-31 DIAGNOSIS — O99330 Smoking (tobacco) complicating pregnancy, unspecified trimester: Secondary | ICD-10-CM

## 2014-12-31 HISTORY — DX: Smoking (tobacco) complicating pregnancy, unspecified trimester: O99.330

## 2014-12-31 HISTORY — DX: Supervision of elderly multigravida, unspecified trimester: O09.529

## 2014-12-31 HISTORY — DX: Supervision of pregnancy with history of pre-term labor, unspecified trimester: O09.219

## 2014-12-31 HISTORY — DX: Supervision of high risk pregnancy, unspecified, unspecified trimester: O09.90

## 2014-12-31 HISTORY — DX: Abnormal glucose complicating pregnancy: O99.810

## 2014-12-31 HISTORY — DX: Obesity complicating pregnancy, unspecified trimester: O99.210

## 2015-01-04 ENCOUNTER — Encounter: Payer: Self-pay | Admitting: *Deleted

## 2015-01-04 DIAGNOSIS — O0990 Supervision of high risk pregnancy, unspecified, unspecified trimester: Secondary | ICD-10-CM

## 2015-02-17 LAB — OB RESULTS CONSOLE RPR: RPR: NONREACTIVE

## 2015-02-25 ENCOUNTER — Encounter: Payer: Medicaid Other | Admitting: Family Medicine

## 2015-02-28 NOTE — L&D Delivery Note (Signed)
Delivery Note After a one push 2nd stage, At 1:45 AM a viable female was delivered via  (Presentation: LOA;  ).  APGAR: ,9/9 ; weight pending.  After 3 minutes, the cord was clamped and cut. 40 units of pitocin diluted in 1000cc LR was infused rapidly IV.  The placenta separated spontaneously and delivered via CCT and maternal pushing effort.  It was inspected and appears to be intact with a 3 VC.    Marland Kitchen   Anesthesia:  none Episiotomy:  none Lacerations:  none Suture Repair: n/a Est. Blood Loss (mL): 25    Mom to postpartum.  Baby to Couplet care / Skin to Skin.  Katie Reed,Katie Reed 04/02/2015, 1:59 AM

## 2015-03-11 ENCOUNTER — Inpatient Hospital Stay (HOSPITAL_COMMUNITY)
Admission: AD | Admit: 2015-03-11 | Discharge: 2015-03-12 | Disposition: A | Discharge status: Home / Self Care | Payer: Medicaid Other | Source: Ambulatory Visit | Attending: Obstetrics & Gynecology | Admitting: Obstetrics & Gynecology

## 2015-03-11 DIAGNOSIS — R102 Pelvic and perineal pain: Secondary | ICD-10-CM | POA: Diagnosis not present

## 2015-03-11 DIAGNOSIS — N949 Unspecified condition associated with female genital organs and menstrual cycle: Secondary | ICD-10-CM

## 2015-03-11 DIAGNOSIS — O26713 Subluxation of symphysis (pubis) in pregnancy, third trimester: Secondary | ICD-10-CM | POA: Insufficient documentation

## 2015-03-11 DIAGNOSIS — O2673 Subluxation of symphysis (pubis) in the puerperium: Secondary | ICD-10-CM

## 2015-03-11 DIAGNOSIS — O99333 Smoking (tobacco) complicating pregnancy, third trimester: Secondary | ICD-10-CM | POA: Insufficient documentation

## 2015-03-11 DIAGNOSIS — O26893 Other specified pregnancy related conditions, third trimester: Secondary | ICD-10-CM | POA: Insufficient documentation

## 2015-03-11 DIAGNOSIS — O99210 Obesity complicating pregnancy, unspecified trimester: Secondary | ICD-10-CM

## 2015-03-11 DIAGNOSIS — O99810 Abnormal glucose complicating pregnancy: Secondary | ICD-10-CM

## 2015-03-11 DIAGNOSIS — R7302 Impaired glucose tolerance (oral): Secondary | ICD-10-CM | POA: Diagnosis not present

## 2015-03-11 DIAGNOSIS — O99213 Obesity complicating pregnancy, third trimester: Secondary | ICD-10-CM | POA: Diagnosis present

## 2015-03-11 DIAGNOSIS — F17210 Nicotine dependence, cigarettes, uncomplicated: Secondary | ICD-10-CM | POA: Insufficient documentation

## 2015-03-11 NOTE — MAU Note (Signed)
Pt arrives via EMS reporting she has been experiencing pelvic pain x3 days. Pt states she has been feeling "[employer]" and some pressure but reports today she could not longer take the pelvic pain. Pt rates pain 6/10 and states it comes and goes. Pt denies injury or trauma as well as denies any bleeding.

## 2015-03-12 ENCOUNTER — Encounter: Payer: Self-pay | Admitting: *Deleted

## 2015-03-12 DIAGNOSIS — O99213 Obesity complicating pregnancy, third trimester: Secondary | ICD-10-CM | POA: Diagnosis not present

## 2015-03-12 DIAGNOSIS — Z3A34 34 weeks gestation of pregnancy: Secondary | ICD-10-CM

## 2015-03-12 DIAGNOSIS — O9989 Other specified diseases and conditions complicating pregnancy, childbirth and the puerperium: Secondary | ICD-10-CM | POA: Diagnosis not present

## 2015-03-12 DIAGNOSIS — R102 Pelvic and perineal pain: Secondary | ICD-10-CM

## 2015-03-12 LAB — URINALYSIS, ROUTINE W REFLEX MICROSCOPIC
BILIRUBIN URINE: NEGATIVE
Glucose, UA: NEGATIVE mg/dL
Hgb urine dipstick: NEGATIVE
Ketones, ur: 15 mg/dL — AB
LEUKOCYTES UA: NEGATIVE
Nitrite: NEGATIVE
PH: 7.5 (ref 5.0–8.0)
Protein, ur: NEGATIVE mg/dL
SPECIFIC GRAVITY, URINE: 1.02 (ref 1.005–1.030)

## 2015-03-12 MED ORDER — CYCLOBENZAPRINE HCL 10 MG PO TABS: 10.0000 mg | ORAL_TABLET | Freq: Three times a day (TID) | ORAL | Status: DC | PRN

## 2015-03-12 MED ORDER — CYCLOBENZAPRINE HCL 10 MG PO TABS
10.0000 mg | ORAL_TABLET | Freq: Once | ORAL | Status: AC
  Administered 2015-03-12: 10 mg via ORAL
  Filled 2015-03-12: qty 1

## 2015-03-12 NOTE — MAU Provider Note (Signed)
History     CSN: [id number]  Arrival date and time: 03/11/15 2335   First Provider Initiated Contact with Patient 03/12/15 0032      Chief Complaint  Patient presents with  . Pelvic Pain   Pelvic Pain The patient's primary symptoms include pelvic pain. This is a new problem. The current episode started yesterday. The problem occurs constantly. The problem has been unchanged. Pain severity now: 6/10. The problem affects both sides. She is pregnant. Pertinent negatives include no abdominal pain, chills, constipation, diarrhea, dysuria, fever, frequency, nausea, urgency or vomiting. The vaginal discharge was normal. There has been no bleeding. Nothing aggravates the symptoms. She has tried acetaminophen for the symptoms. The treatment provided no relief.   Past Medical History  Diagnosis Date  . Anxiety   . Depression     Past Surgical History  Procedure Laterality Date  . No past surgeries    . Therapeutic abortion      elective    Family History  Problem Relation Age of Onset  . Alcohol abuse Neg Hx   . Arthritis Neg Hx   . Asthma Neg Hx   . Birth defects Neg Hx   . Cancer Neg Hx   . COPD Neg Hx   . Depression Neg Hx   . Diabetes Neg Hx   . Drug abuse Neg Hx   . Early death Neg Hx   . Hearing loss Neg Hx   . Heart disease Neg Hx   . Hyperlipidemia Neg Hx   . Hypertension Neg Hx   . Kidney disease Neg Hx   . Learning disabilities Neg Hx   . Mental illness Neg Hx   . Mental retardation Neg Hx   . Miscarriages / Stillbirths Neg Hx   . Stroke Neg Hx   . Vision loss Neg Hx   . Varicose Veins Neg Hx     Social History  Substance Use Topics  . Smoking status: Current Some Day Smoker -- 0.25 packs/day for 14 years    Types: Cigarettes  . Smokeless tobacco: Never Used  . Alcohol Use: Yes     Comment: last few days ago    Allergies: No Known Allergies  Prescriptions prior to admission  Medication Sig Dispense Refill Last Dose  . Prenatal Vit-Fe Fumarate-FA  (PRENATAL MULTIVITAMIN) TABS tablet Take 1 tablet by mouth daily at 12 noon.   Taking  . promethazine (PHENERGAN) 12.5 MG tablet Take 1 tablet (12.5 mg total) by mouth every 6 (six) hours as needed for nausea or vomiting. 30 tablet 0 Taking    Review of Systems  Constitutional: Negative for fever and chills.  Gastrointestinal: Negative for nausea, vomiting, abdominal pain, diarrhea and constipation.  Genitourinary: Positive for pelvic pain. Negative for dysuria, urgency and frequency.   Physical Exam   Blood pressure 115/69, pulse 88, temperature 98.2 F (36.8 C), temperature source Oral, resp. rate 16, height 5\' 3"  (1.6 m), weight 82.555 kg (182 lb), last menstrual period 07/11/2014, SpO2 100 %, unknown if currently breastfeeding.  Physical Exam  Nursing note and vitals reviewed. Constitutional: She is oriented to person, place, and time. She appears well-developed and well-nourished. No distress.  HENT:  Head: Normocephalic.  Cardiovascular: Normal rate.   Respiratory: Effort normal.  GI: Soft. There is no tenderness. There is no rebound.  Genitourinary:  Cervix: FT/50/-1/Posterior   Neurological: She is alert and oriented to person, place, and time.  Skin: Skin is warm and dry.  Psychiatric: She has a  normal mood and affect.   Results for orders placed or performed during the hospital encounter of 03/11/15 (from the past 24 hour(s))  Urinalysis, Routine w reflex microscopic (not at [facility])     Status: Abnormal   Collection Time: 03/11/15 11:45 PM  Result Value Ref Range   Color, Urine YELLOW YELLOW   APPearance CLEAR CLEAR   Specific Gravity, Urine 1.020 1.005 - 1.030   pH 7.5 5.0 - 8.0   Glucose, UA NEGATIVE NEGATIVE mg/dL   Hgb urine dipstick NEGATIVE NEGATIVE   Bilirubin Urine NEGATIVE NEGATIVE   Ketones, ur 15 (A) NEGATIVE mg/dL   Protein, ur NEGATIVE NEGATIVE mg/dL   Nitrite NEGATIVE NEGATIVE   Leukocytes, UA NEGATIVE NEGATIVE    FHT 140, moderate with 15x15 accels,  no decels Toco: no UCs  MAU Course  Procedures  MDM 0123: patient has had flexeril. She reports that her pain is better, and she is able to walk more easily.   Assessment and Plan   1. Obesity in pregnancy   2. Abnormal glucose tolerance test (GTT) during pregnancy, antepartum   3. Round ligament pain   4. 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   Specialty:  Obstetrics and Gynecology   Why:  as scheduled for Monday    Contact information:   [place] [phone]        Mathis Bud 03/12/2015, 12:39 AM

## 2015-03-12 NOTE — Discharge Instructions (Signed)

## 2015-03-15 ENCOUNTER — Encounter: Payer: Medicaid Other | Admitting: Obstetrics and Gynecology

## 2015-03-15 ENCOUNTER — Encounter: Payer: Self-pay | Admitting: Obstetrics & Gynecology

## 2015-03-22 ENCOUNTER — Other Ambulatory Visit (HOSPITAL_COMMUNITY)
Admission: RE | Admit: 2015-03-22 | Discharge: 2015-03-22 | Disposition: A | Discharge status: Home / Self Care | Payer: Medicaid Other | Source: Ambulatory Visit | Attending: Family Medicine | Admitting: Family Medicine

## 2015-03-22 ENCOUNTER — Ambulatory Visit (INDEPENDENT_AMBULATORY_CARE_PROVIDER_SITE_OTHER): Payer: Medicaid Other | Admitting: Family Medicine

## 2015-03-22 ENCOUNTER — Encounter: Payer: Self-pay | Admitting: Family Medicine

## 2015-03-22 VITALS — BP 104/67 | HR 90 | Temp 98.5°F | Wt 186.1 lb

## 2015-03-22 DIAGNOSIS — O09213 Supervision of pregnancy with history of pre-term labor, third trimester: Secondary | ICD-10-CM

## 2015-03-22 DIAGNOSIS — O09523 Supervision of elderly multigravida, third trimester: Secondary | ICD-10-CM

## 2015-03-22 DIAGNOSIS — Z113 Encounter for screening for infections with a predominantly sexual mode of transmission: Secondary | ICD-10-CM | POA: Diagnosis present

## 2015-03-22 DIAGNOSIS — O99810 Abnormal glucose complicating pregnancy: Secondary | ICD-10-CM

## 2015-03-22 DIAGNOSIS — O0993 Supervision of high risk pregnancy, unspecified, third trimester: Secondary | ICD-10-CM

## 2015-03-22 LAB — CBC
HCT: 31.2 % — ABNORMAL LOW (ref 36.0–46.0)
Hemoglobin: 10.2 g/dL — ABNORMAL LOW (ref 12.0–15.0)
MCH: 29.5 pg (ref 26.0–34.0)
MCHC: 32.7 g/dL (ref 30.0–36.0)
MCV: 90.2 fL (ref 78.0–100.0)
MPV: 9.8 fL (ref 8.6–12.4)
PLATELETS: 344 10*3/uL (ref 150–400)
RBC: 3.46 MIL/uL — AB (ref 3.87–5.11)
RDW: 14.8 % (ref 11.5–15.5)
WBC: 7.2 10*3/uL (ref 4.0–10.5)

## 2015-03-22 LAB — POCT URINALYSIS DIP (DEVICE)
BILIRUBIN URINE: NEGATIVE
GLUCOSE, UA: NEGATIVE mg/dL
HGB URINE DIPSTICK: NEGATIVE
NITRITE: NEGATIVE
Protein, ur: NEGATIVE mg/dL
Specific Gravity, Urine: 1.02 (ref 1.005–1.030)
UROBILINOGEN UA: 1 mg/dL (ref 0.0–1.0)
pH: 7 (ref 5.0–8.0)

## 2015-03-22 NOTE — Progress Notes (Signed)
Subjective:  Katie Reed is a 35 y.o. [id number] at [redacted]w[redacted]d being seen today for ongoing prenatal care.  She is currently monitored for the following issues for this high-risk pregnancy and has Supervision of high-risk pregnancy; High risk pregnancy due to history of preterm labor; AMA (advanced maternal age) multigravida 54+; Obesity in pregnancy; Tobacco smoking affecting pregnancy, antepartum; and Abnormal glucose tolerance test (GTT) during pregnancy, antepartum on her problem list.  Patient reports no complaints.  Contractions: Irregular. Vag. Bleeding: None.  Movement: Present. Denies leaking of fluid.   The following portions of the patient's history were reviewed and updated as appropriate: allergies, current medications, past family history, past medical history, past social history, past surgical history and problem list. Problem list updated.  Objective:   Filed Vitals:   03/22/15 0912  BP: 104/67  Pulse: 90  Temp: 98.5 F (36.9 C)  Weight: 186 lb 1.6 oz (84.414 kg)    Fetal Status: Fetal Heart Rate (bpm): 143   Movement: Present     General:  Alert, oriented and cooperative. Patient is in no acute distress.  Skin: Skin is warm and dry. No rash noted.   Cardiovascular: Normal heart rate noted  Respiratory: Normal respiratory effort, no problems with respiration noted  Abdomen: Soft, gravid, appropriate for gestational age. Pain/Pressure: Present     Pelvic: Vag. Bleeding: None     Cervical exam deferred        Extremities: Normal range of motion.  Edema: None  Mental Status: Normal mood and affect. Normal behavior. Normal judgment and thought content.   Urinalysis: Urine Protein: Negative Urine Glucose: Negative  Assessment and Plan:  Pregnancy: [id number] at [redacted]w[redacted]d  1. Abnormal glucose tolerance test (GTT) during pregnancy, antepartum - failed early 1 hour, passed 3 hr - ordered repeat third trimester screening  2. AMA (advanced maternal age) multigravida 43+, third  trimester  3. High risk pregnancy due to history of preterm labor, third trimester  4. Supervision of high-risk pregnancy, third trimester - added box/updated - Glucose Tolerance, 1 HR (50g) w/o Fasting - CBC - HIV antibody (with reflex) - RPR - GC/Chlamydia probe amp ([place])not at [facility] - Culture, beta strep (group b only)  Preterm labor symptoms and general obstetric precautions including but not limited to vaginal bleeding, contractions, leaking of fluid and fetal movement were reviewed in detail with the patient. Please refer to After Visit Summary for other counseling recommendations.  Return in about 1 week (around 03/29/2015) for Routine prenatal care.   Caren Macadam, MD

## 2015-03-22 NOTE — Progress Notes (Signed)
Urine: trace wbcs Patient does not plan to breastfeed Initial and 28 week prenatal packets given 28 week labs, 1hr gtt today Cultures today Declined flu Tdap @ [facility]

## 2015-03-22 NOTE — Patient Instructions (Signed)

## 2015-03-23 ENCOUNTER — Encounter: Payer: Self-pay | Admitting: Family Medicine

## 2015-03-23 DIAGNOSIS — IMO0002 Reserved for concepts with insufficient information to code with codable children: Secondary | ICD-10-CM | POA: Insufficient documentation

## 2015-03-23 LAB — RPR

## 2015-03-23 LAB — HIV ANTIBODY (ROUTINE TESTING W REFLEX): HIV: NONREACTIVE

## 2015-03-23 LAB — GLUCOSE TOLERANCE, 1 HOUR (50G) W/O FASTING: GLUCOSE 1 HOUR GTT: 85 mg/dL (ref 70–140)

## 2015-03-23 LAB — GC/CHLAMYDIA PROBE AMP (~~LOC~~) NOT AT ARMC
Chlamydia: NEGATIVE
Neisseria Gonorrhea: NEGATIVE

## 2015-03-24 LAB — CULTURE, BETA STREP (GROUP B ONLY)

## 2015-03-29 ENCOUNTER — Encounter: Payer: Medicaid Other | Admitting: Obstetrics and Gynecology

## 2015-04-01 ENCOUNTER — Inpatient Hospital Stay (HOSPITAL_COMMUNITY)
Admission: AD | Admit: 2015-04-01 | Discharge: 2015-04-03 | DRG: 775 | Disposition: A | Discharge status: Home / Self Care | Payer: Medicaid Other | Source: Ambulatory Visit | Attending: Obstetrics & Gynecology | Admitting: Obstetrics & Gynecology

## 2015-04-01 DIAGNOSIS — F17210 Nicotine dependence, cigarettes, uncomplicated: Secondary | ICD-10-CM | POA: Diagnosis present

## 2015-04-01 DIAGNOSIS — IMO0001 Reserved for inherently not codable concepts without codable children: Secondary | ICD-10-CM

## 2015-04-01 DIAGNOSIS — O99210 Obesity complicating pregnancy, unspecified trimester: Secondary | ICD-10-CM

## 2015-04-01 DIAGNOSIS — O99810 Abnormal glucose complicating pregnancy: Secondary | ICD-10-CM

## 2015-04-01 DIAGNOSIS — O99334 Smoking (tobacco) complicating childbirth: Principal | ICD-10-CM | POA: Diagnosis present

## 2015-04-01 DIAGNOSIS — Z8249 Family history of ischemic heart disease and other diseases of the circulatory system: Secondary | ICD-10-CM

## 2015-04-01 DIAGNOSIS — O09213 Supervision of pregnancy with history of pre-term labor, third trimester: Secondary | ICD-10-CM

## 2015-04-01 DIAGNOSIS — Z3A37 37 weeks gestation of pregnancy: Secondary | ICD-10-CM | POA: Diagnosis not present

## 2015-04-01 DIAGNOSIS — O0993 Supervision of high risk pregnancy, unspecified, third trimester: Secondary | ICD-10-CM

## 2015-04-01 HISTORY — DX: Reserved for inherently not codable concepts without codable children: IMO0001

## 2015-04-01 LAB — CBC
HCT: 34.3 % — ABNORMAL LOW (ref 36.0–46.0)
HEMOGLOBIN: 11.5 g/dL — AB (ref 12.0–15.0)
MCH: 29.7 pg (ref 26.0–34.0)
MCHC: 33.5 g/dL (ref 30.0–36.0)
MCV: 88.6 fL (ref 78.0–100.0)
Platelets: 326 10*3/uL (ref 150–400)
RBC: 3.87 MIL/uL (ref 3.87–5.11)
RDW: 15.1 % (ref 11.5–15.5)
WBC: 9.4 10*3/uL (ref 4.0–10.5)

## 2015-04-01 LAB — TYPE AND SCREEN
ABO/RH(D): A POS
Antibody Screen: NEGATIVE

## 2015-04-01 MED ORDER — OXYCODONE-ACETAMINOPHEN 5-325 MG PO TABS: 2.0000 | ORAL_TABLET | ORAL | Status: DC | PRN

## 2015-04-01 MED ORDER — OXYTOCIN BOLUS FROM INFUSION
500.0000 mL | INTRAVENOUS | Status: DC
Start: 2015-04-01 — End: 2015-04-02
  Administered 2015-04-02: 500 mL via INTRAVENOUS

## 2015-04-01 MED ORDER — ONDANSETRON HCL 4 MG/2ML IJ SOLN: 4.0000 mg | Freq: Four times a day (QID) | INTRAMUSCULAR | Status: DC | PRN

## 2015-04-01 MED ORDER — OXYCODONE-ACETAMINOPHEN 5-325 MG PO TABS
1.0000 | ORAL_TABLET | ORAL | Status: DC | PRN
  Administered 2015-04-02: 1 via ORAL
  Filled 2015-04-01: qty 1

## 2015-04-01 MED ORDER — FLEET ENEMA 7-19 GM/118ML RE ENEM: 1.0000 | ENEMA | RECTAL | Status: DC | PRN

## 2015-04-01 MED ORDER — CITRIC ACID-SODIUM CITRATE 334-500 MG/5ML PO SOLN: 30.0000 mL | ORAL | Status: DC | PRN

## 2015-04-01 MED ORDER — FENTANYL CITRATE (PF) 100 MCG/2ML IJ SOLN
50.0000 ug | INTRAMUSCULAR | Status: DC | PRN
  Administered 2015-04-01: 50 ug via INTRAVENOUS
  Filled 2015-04-01: qty 2

## 2015-04-01 MED ORDER — LACTATED RINGERS IV SOLN
INTRAVENOUS | Status: DC
  Administered 2015-04-01: 23:00:00 via INTRAVENOUS

## 2015-04-01 MED ORDER — LIDOCAINE HCL (PF) 1 % IJ SOLN
30.0000 mL | INTRAMUSCULAR | Status: DC | PRN
Start: 2015-04-01 — End: 2015-04-02
  Filled 2015-04-01: qty 30

## 2015-04-01 MED ORDER — OXYTOCIN 10 UNIT/ML IJ SOLN
2.5000 [IU]/h | INTRAVENOUS | Status: DC
  Filled 2015-04-01: qty 4

## 2015-04-01 MED ORDER — LACTATED RINGERS IV SOLN: 500.0000 mL | INTRAVENOUS | Status: DC | PRN

## 2015-04-01 MED ORDER — ACETAMINOPHEN 325 MG PO TABS: 650.0000 mg | ORAL_TABLET | ORAL | Status: DC | PRN

## 2015-04-01 NOTE — H&P (Signed)
Katie Reed is a 35 y.o. female [phone] with IUP at [redacted]w[redacted]d presenting for contractions. Pt states she has been having regular, every 2-3 minutes contractions, associated with none vaginal bleeding..  Membranes are intact, with active fetal movement.   PNCare at [facility] at 23 weeks w/transfer to [facility]  Prenatal History/Complications:  SVD at 35 weeks: was too late for 17p Past Medical History: Past Medical History  Diagnosis Date  . Anxiety   . Depression   . Preterm labor     Past Surgical History: Past Surgical History  Procedure Laterality Date  . No past surgeries    . Therapeutic abortion      elective    Obstetrical History: OB History    Gravida Para Term Preterm AB TAB SAB Ectopic Multiple Living   6 4 3 1 1 1  0 0 0 4       Social History: Social History   Social History  . Marital Status: Single    Spouse Name: N/A  . Number of Children: N/A  . Years of Education: N/A   Social History Main Topics  . Smoking status: Current Some Day Smoker -- 0.25 packs/day for 14 years    Types: Cigarettes  . Smokeless tobacco: Never Used  . Alcohol Use: 0.6 oz/week    1 Glasses of wine per week     Comment: 1-2 times per week  . Drug Use: No  . Sexual Activity: Not Currently    Birth Control/ Protection: None   Other Topics Concern  . Not on file   Social History Narrative    Family History: Family History  Problem Relation Age of Onset  . Alcohol abuse Neg Hx   . Arthritis Neg Hx   . Asthma Neg Hx   . Birth defects Neg Hx   . Cancer Neg Hx   . COPD Neg Hx   . Depression Neg Hx   . Diabetes Neg Hx   . Drug abuse Neg Hx   . Early death Neg Hx   . Hearing loss Neg Hx   . Heart disease Neg Hx   . Hyperlipidemia Neg Hx   . Kidney disease Neg Hx   . Learning disabilities Neg Hx   . Mental illness Neg Hx   . Mental retardation Neg Hx   . Miscarriages / Stillbirths Neg Hx   . Stroke Neg Hx   . Vision loss Neg Hx   . Varicose Veins Neg Hx   . Hypertension  Mother     Allergies: No Known Allergies  Prescriptions prior to admission  Medication Sig Dispense Refill Last Dose  . cyclobenzaprine (FLEXERIL) 10 MG tablet Take 1 tablet (10 mg total) by mouth 3 (three) times daily as needed for muscle spasms. 20 tablet 0 Taking  . Prenatal Vit-Fe Fumarate-FA (PRENATAL MULTIVITAMIN) TABS tablet Take 1 tablet by mouth daily at 12 noon.   Taking  . promethazine (PHENERGAN) 12.5 MG tablet Take 1 tablet (12.5 mg total) by mouth every 6 (six) hours as needed for nausea or vomiting. 30 tablet 0 Taking     Prenatal Transfer Tool  Maternal Diabetes: No Genetic Screening: Declined Maternal Ultrasounds/Referrals: Normal Fetal Ultrasounds or other Referrals:  None Maternal Substance Abuse:  No Significant Maternal Medications:  None Significant Maternal Lab Results: None     Review of Systems   Constitutional: Negative for fever and chills Eyes: Negative for visual disturbances Respiratory: Negative for shortness of breath, dyspnea Cardiovascular: Negative for chest pain  or palpitations  Gastrointestinal: Negative for vomiting, diarrhea and constipation.  POSITIVE for abdominal pain (contractions) Genitourinary: Negative for dysuria and urgency Musculoskeletal: Negative for back pain, joint pain, myalgias  Neurological: Negative for dizziness and headaches      Last menstrual period 07/11/2014, unknown if currently breastfeeding. General appearance: alert, cooperative and no distress Lungs: clear to auscultation bilaterally Heart: regular rate and rhythm Abdomen: soft, non-tender; bowel sounds normal Pelvic: 7/100/-2 Extremities: Homans sign is negative, no sign of DVT DTR's 2+ Presentation: cephalic Fetal monitoring  Baseline: 140 bpm, Variability: Good {> 6 bpm), Accelerations: Reactive and Decelerations: Absent Uterine activity  q 2-3 minutes      Prenatal labs: ABO, Rh: A/Positive/-- (10/20 0000) Antibody: Negative (10/20  0000) Rubella: !Error!immune RPR: NON REAC (01/23 1024)  HBsAg: Negative (10/20 0000)  HIV: NONREACTIVE (01/23 1024)    Clinic   High Risk - transfer from [place]  Dating  by LMP Blood type: A/Positive/-- (10/20 0000)   Genetic Screen 1 Screen: too late   AFP: too late    Quad:  Too late    NIPS: Antibody:Negative (10/20 0000)  Anatomic US  wnl Rubella: Immune (10/20 0000)  GTT Early:   135  3 hour = 79-150-125-129 (passed)      Third trimester: 85  RPR:     Flu vaccine  declined HBsAg:   negative  TDaP vaccine   03/22/2015                                        Rhogam: HIV: Non-reactive (10/20 0000)   Baby Food   formula                                          GBS: (For PCN allergy, check sensitivities)  Contraception  Depo [id number] + HPV-- needs COLPO at 6wk pp  Circumcision    Pediatrician Hawley Person       No results found for this or any previous visit (from the past 24 hour(s)).  Assessment: MARTELL RACCA is a 35 y.o. [phone] with an IUP at [redacted]w[redacted]d presenting for active labor  Plan: #Labor: expectant management #Pain:  Per request #FWB Cat 1    CRESENZO-DISHMAN,Kabe Mckoy 04/01/2015, 10:56 PM

## 2015-04-01 NOTE — MAU Note (Addendum)
Pt presented to MAU with painful UC's. FHT's obtained WNL

## 2015-04-02 ENCOUNTER — Encounter (HOSPITAL_COMMUNITY): Payer: Self-pay

## 2015-04-02 DIAGNOSIS — Z3A37 37 weeks gestation of pregnancy: Secondary | ICD-10-CM

## 2015-04-02 LAB — RPR: RPR: NONREACTIVE

## 2015-04-02 MED ORDER — PRENATAL MULTIVITAMIN CH
1.0000 | ORAL_TABLET | Freq: Every day | ORAL | Status: DC
  Administered 2015-04-03: 1 via ORAL
  Filled 2015-04-02 (×2): qty 1

## 2015-04-02 MED ORDER — METHYLERGONOVINE MALEATE 0.2 MG/ML IJ SOLN: 0.2000 mg | INTRAMUSCULAR | Status: DC | PRN

## 2015-04-02 MED ORDER — ZOLPIDEM TARTRATE 5 MG PO TABS: 5.0000 mg | ORAL_TABLET | Freq: Every evening | ORAL | Status: DC | PRN

## 2015-04-02 MED ORDER — MEASLES, MUMPS & RUBELLA VAC ~~LOC~~ INJ
0.5000 mL | INJECTION | Freq: Once | SUBCUTANEOUS | Status: DC
  Filled 2015-04-02: qty 0.5

## 2015-04-02 MED ORDER — LANOLIN HYDROUS EX OINT: TOPICAL_OINTMENT | CUTANEOUS | Status: DC | PRN

## 2015-04-02 MED ORDER — TETANUS-DIPHTH-ACELL PERTUSSIS 5-2.5-18.5 LF-MCG/0.5 IM SUSP: 0.5000 mL | Freq: Once | INTRAMUSCULAR | Status: DC

## 2015-04-02 MED ORDER — FERROUS SULFATE 325 (65 FE) MG PO TABS
325.0000 mg | ORAL_TABLET | Freq: Two times a day (BID) | ORAL | Status: DC
  Administered 2015-04-02 – 2015-04-03 (×3): 325 mg via ORAL
  Filled 2015-04-02 (×3): qty 1

## 2015-04-02 MED ORDER — SENNOSIDES-DOCUSATE SODIUM 8.6-50 MG PO TABS
2.0000 | ORAL_TABLET | ORAL | Status: DC
  Administered 2015-04-02: 2 via ORAL
  Filled 2015-04-02: qty 2

## 2015-04-02 MED ORDER — BENZOCAINE-MENTHOL 20-0.5 % EX AERO: 1.0000 "application " | INHALATION_SPRAY | CUTANEOUS | Status: DC | PRN

## 2015-04-02 MED ORDER — SIMETHICONE 80 MG PO CHEW: 80.0000 mg | CHEWABLE_TABLET | ORAL | Status: DC | PRN

## 2015-04-02 MED ORDER — ONDANSETRON HCL 4 MG/2ML IJ SOLN: 4.0000 mg | INTRAMUSCULAR | Status: DC | PRN

## 2015-04-02 MED ORDER — WITCH HAZEL-GLYCERIN EX PADS
1.0000 | MEDICATED_PAD | CUTANEOUS | Status: DC | PRN
Start: 2015-04-02 — End: 2015-04-03

## 2015-04-02 MED ORDER — ACETAMINOPHEN 325 MG PO TABS
650.0000 mg | ORAL_TABLET | ORAL | Status: DC | PRN
  Administered 2015-04-02: 650 mg via ORAL
  Filled 2015-04-02: qty 2

## 2015-04-02 MED ORDER — DIPHENHYDRAMINE HCL 25 MG PO CAPS: 25.0000 mg | ORAL_CAPSULE | Freq: Four times a day (QID) | ORAL | Status: DC | PRN

## 2015-04-02 MED ORDER — ONDANSETRON HCL 4 MG PO TABS: 4.0000 mg | ORAL_TABLET | ORAL | Status: DC | PRN

## 2015-04-02 MED ORDER — BISACODYL 10 MG RE SUPP: 10.0000 mg | Freq: Every day | RECTAL | Status: DC | PRN

## 2015-04-02 MED ORDER — DIBUCAINE 1 % RE OINT
1.0000 | TOPICAL_OINTMENT | RECTAL | Status: DC | PRN
Start: 2015-04-02 — End: 2015-04-03

## 2015-04-02 MED ORDER — IBUPROFEN 600 MG PO TABS
600.0000 mg | ORAL_TABLET | Freq: Four times a day (QID) | ORAL | Status: DC
  Administered 2015-04-02 – 2015-04-03 (×6): 600 mg via ORAL
  Filled 2015-04-02 (×6): qty 1

## 2015-04-02 MED ORDER — METHYLERGONOVINE MALEATE 0.2 MG PO TABS: 0.2000 mg | ORAL_TABLET | ORAL | Status: DC | PRN

## 2015-04-02 MED ORDER — FLEET ENEMA 7-19 GM/118ML RE ENEM: 1.0000 | ENEMA | Freq: Every day | RECTAL | Status: DC | PRN

## 2015-04-02 NOTE — Progress Notes (Signed)
UR chart review completed.  

## 2015-04-02 NOTE — Progress Notes (Signed)
AROM with clear fluid. CX non reducible, no urge to push. FHT Cat 1.  Will wait until urge to push.  CRESENZO-DISHMAN,Kirstin Kugler

## 2015-04-03 MED ORDER — IBUPROFEN 600 MG PO TABS: 600.0000 mg | ORAL_TABLET | Freq: Four times a day (QID) | ORAL | Status: DC | PRN

## 2015-04-03 NOTE — Progress Notes (Cosign Needed)
CLINICAL SOCIAL WORK MATERNAL/CHILD NOTE  Patient Details  Name: Katie Reed MRN: [id number] Date of Birth: 04/02/2016  Date: 04/03/2015  Clinical Social Worker Initiating Note: Norlene Duel, LCSWDate/ Time Initiated: 04/03/15/1145   Child's Name: Katie Reed   Legal Guardian:  (parents Drnest Edison Pace and Asher Muir)   Need for Interpreter: None   Date of Referral: 04/02/15   Reason for Referral: Other (Comment)   Referral Source: [facility]   Address: [address] [place], [place] [address]  Phone number:     Household Members: Minor Children, Self   Natural Supports (not living in the home): Extended Family, Spouse/significant other   Professional Supports:Therapist (Informed that she receives in-home therapy )   Employment: ([employer] is employed)   Type of Work:     Education:     Museum/gallery curator Resources:Medicaid   Other Resources: [employer], Physicist, medical    Cultural/Religious Considerations Which May Impact Care:none noted  Strengths: Ability to meet basic needs , Home prepared for child , Pediatrician chosen    Risk Factors/Current Problems: DHHS Involvement  (Mother states that DSS became involved due to issues with her 36 year old)   Cognitive State: Able to Concentrate , Alert    Mood/Affect: Happy , Bright    CSW Assessment: Acknowledged order for social work consult to assess mother's hx of Anxiety and Depression. Met with mother who was pleasant and receptive to social work. Informed that she does not have a hx of anxiety and depression, but suffered from PP Depression after the birth of both her last child. She has 4 other children ages 36, 66, 73, and 2. Mother states that she was prescribed medication, but "had weird thoughts on the medication". She denies any current symptoms of depression or anxiety. She denies any hx of illicit drug use. She was preoccupied with packing her things to leave  and anxious to return home. "My children have been constantly calling me". Informed that she and FOB maintain a supportive relationship. MOB states that her 36 year old was having "issues" and DSS got involved. She did not go into details about her oldest son, but stated "he is a lot better now". Informed that all of her children reside with her except the 47 year old. No acute social concerns noted or reported at this time. Mother informed of social work Fish farm manager.  CSW Plan/Description:    She's aware of signs/symptoms of PP Depression and available resources No further intervention required No barriers to discharge   Alexande Sheerin J, LCSW 04/03/2015, 4:10 PM

## 2015-04-03 NOTE — Discharge Summary (Signed)
OB Discharge Summary     Patient Name: Katie Reed DOB: [birth year] MRN: [id number]  Date of admission: 04/01/2015 Delivering MD: Christin Fudge   Date of discharge: 04/03/2015  Admitting diagnosis: 38.2w ctx 5-8 min Intrauterine pregnancy: [redacted]w[redacted]d     Secondary diagnosis:  Active Problems:   Active labor  Additional problems: none     Discharge diagnosis: Term Pregnancy Delivered                                                                                                Post partum procedures:none  Augmentation: AROM  Complications: None  Hospital course:  Onset of Labor With Vaginal Delivery     35 y.o. yo [id number] at [redacted]w[redacted]d was admitted in Active Labor on 04/01/2015. Patient had an uncomplicated labor course as follows:  Membrane Rupture Time/Date: 12:12 AM ,04/02/2015   Intrapartum Procedures: Episiotomy: None [1]                                         Lacerations:  None [1]  Patient had a delivery of a Viable infant. 04/02/2015  Information for the patient's newborn:  Brylynn, Boehringer [id number]  Delivery Method: Vaginal, Spontaneous Delivery (Filed from Delivery Summary)    Pateint had an uncomplicated postpartum course.  She is ambulating, tolerating a regular diet, passing flatus, and urinating well. Patient is discharged home in stable condition on 04/03/2015.    Physical exam  Filed Vitals:   04/02/15 0830 04/02/15 1413 04/02/15 1759 04/03/15 0642  BP: 117/65 129/70 123/64 119/72  Pulse: 70 71 72 70  Temp: 97.4 F (36.3 C) 98.1 F (36.7 C) 98.2 F (36.8 C) 98.5 F (36.9 C)  TempSrc: Axillary Oral Oral Oral  Resp: 18 18 18 18   Height:      Weight:      SpO2:    100%   General: alert and cooperative Lochia: appropriate Uterine Fundus: firm Incision: N/A DVT Evaluation: No evidence of DVT seen on physical exam. Labs: Lab Results  Component Value Date   WBC 9.4 04/01/2015   HGB 11.5* 04/01/2015   HCT 34.3* 04/01/2015   MCV 88.6  04/01/2015   PLT 326 04/01/2015   CMP Latest Ref Rng 12/17/2014  Glucose 65 - 99 mg/dL 92  BUN 6 - 20 mg/dL 11  Creatinine 0.44 - 1.00 mg/dL 0.51  Sodium 135 - 145 mmol/L 133(L)  Potassium 3.5 - 5.1 mmol/L 3.6  Chloride 101 - 111 mmol/L 106  CO2 22 - 32 mmol/L 22  Calcium 8.9 - 10.3 mg/dL 8.6(L)  Total Protein 6.5 - 8.1 g/dL 6.6  Total Bilirubin 0.3 - 1.2 mg/dL 0.3  Alkaline Phos 38 - 126 U/L 44  AST 15 - 41 U/L 19  ALT 14 - 54 U/L 27    Discharge instruction: per After Visit Summary and "Baby and Me Booklet".  After visit meds:    Medication List    STOP taking these medications  acetaminophen 325 MG tablet  Commonly known as:  TYLENOL     cyclobenzaprine 10 MG tablet  Commonly known as:  FLEXERIL      TAKE these medications        ibuprofen 600 MG tablet  Commonly known as:  ADVIL,MOTRIN  Take 1 tablet (600 mg total) by mouth every 6 (six) hours as needed.     prenatal multivitamin Tabs tablet  Take 1 tablet by mouth daily at 12 noon.        Diet: routine diet  Activity: Advance as tolerated. Pelvic rest for 6 weeks.   Outpatient follow up:6 weeks Follow up Appt:No future appointments. Follow up Visit:No Follow-up on file.  Postpartum contraception: Depo Provera @ 6wk PP visit  Newborn Data: Live born female  Birth Weight: 7 lb 1.6 oz (3221 g) APGAR: 9, 9  Baby Feeding: Bottle Disposition:home with mother   04/03/2015 Serita Grammes, CNM  7:49 AM

## 2015-04-03 NOTE — Discharge Instructions (Signed)

## 2015-05-03 ENCOUNTER — Encounter: Payer: Self-pay | Admitting: *Deleted

## 2015-05-17 ENCOUNTER — Ambulatory Visit (INDEPENDENT_AMBULATORY_CARE_PROVIDER_SITE_OTHER): Payer: Medicaid Other | Admitting: Obstetrics & Gynecology

## 2015-05-17 ENCOUNTER — Encounter: Payer: Self-pay | Admitting: Obstetrics & Gynecology

## 2015-05-17 DIAGNOSIS — R896 Abnormal cytological findings in specimens from other organs, systems and tissues: Secondary | ICD-10-CM | POA: Diagnosis not present

## 2015-05-17 DIAGNOSIS — IMO0002 Reserved for concepts with insufficient information to code with codable children: Secondary | ICD-10-CM

## 2015-05-17 DIAGNOSIS — Z3042 Encounter for surveillance of injectable contraceptive: Secondary | ICD-10-CM | POA: Diagnosis not present

## 2015-05-17 DIAGNOSIS — Z3009 Encounter for other general counseling and advice on contraception: Secondary | ICD-10-CM

## 2015-05-17 DIAGNOSIS — Z3202 Encounter for pregnancy test, result negative: Secondary | ICD-10-CM

## 2015-05-17 LAB — POCT PREGNANCY, URINE: PREG TEST UR: NEGATIVE

## 2015-05-17 MED ORDER — MEDROXYPROGESTERONE ACETATE 150 MG/ML IM SUSP: 150.0000 mg | INTRAMUSCULAR | Status: DC

## 2015-05-17 MED ORDER — MEDROXYPROGESTERONE ACETATE 150 MG/ML IM SUSP
150.0000 mg | Freq: Once | INTRAMUSCULAR | Status: AC
  Administered 2015-05-17: 150 mg via INTRAMUSCULAR

## 2015-05-17 NOTE — Progress Notes (Signed)
Patient ID: MEENAKSHI COZZENS, female   DOB: 1980-12-15, 35 y.o.   MRN: [id number]  Subjective:     SYEEDA GODARD is a 35 y.o. female who presents for a postpartum visit. She is 6 weeks postpartum following a vaginal delivery on 04/02/2015. I have fully reviewed the prenatal and intrapartum course. The delivery was at 45 gestational weeks. Postpartum course has been completed . Baby's course has been completed. Baby is feeding by bottle.Pt is currently bleeding. Bowel function is normal. Bladder function is normal. Patient is sexually active x 1 with condom 1 week prev. Contraception method is Depo Provera (desires). Postpartum depression screening  Negative  The following portions of the patient's history were reviewed and updated as appropriate: allergies, current medications, past family history, past medical history, past social history, past surgical history and problem list.  Pt reports heavy bleeding today c/w menses. Declines PAP or colpo.  Review of Systems Pertinent items are noted in HPI.   Objective:    BP 97/84 mmHg  Pulse 64  Temp(Src) 98.5 F (36.9 C)  Wt 178 lb 1.6 oz (80.786 kg)       Pt in NAD Assessment:     6weeks postpartum exam. Pap smear not done at today's visit.   H/o ASCUS with +hrHPV 12/17/2014.  Colpo NOT done during preg   Plan:    1. Contraception: Depo-Provera injections 2. Depo Provera 150mg  IM today 3. Follow up in: next available for colpo .    Akai Dollard L. Harraway-Smith, M.D., Cherlynn June

## 2015-05-17 NOTE — Patient Instructions (Signed)
Colposcopy  Colposcopy is a procedure to examine your cervix and vagina, or the area around the outside of your vagina, for abnormalities or signs of disease. The procedure is done using a lighted microscope called a colposcope. Tissue samples may be collected during the colposcopy if your health care provider finds any unusual cells. A colposcopy may be done if a woman has:  · An abnormal Pap test. A Pap test is a medical test done to evaluate cells that are on the surface of the cervix.  · A Pap test result that is suggestive of human papillomavirus (HPV). This virus can cause genital warts and is linked to the development of cervical cancer.  · A sore on her cervix and the results of a Pap test were normal.  · Genital warts on the cervix or in or around the outside of the vagina.  · A mother who took the drug diethylstilbestrol (DES) while pregnant.  · Painful intercourse.  · Vaginal bleeding, especially after sexual intercourse.  LET YOUR HEALTH CARE PROVIDER KNOW ABOUT:  · Any allergies you have.  · All medicines you are taking, including vitamins, herbs, eye drops, creams, and over-the-counter medicines.  · Previous problems you or members of your family have had with the use of anesthetics.  · Any blood disorders you have.  · Previous surgeries you have had.  · Medical conditions you have.  RISKS AND COMPLICATIONS  Generally, a colposcopy is a safe procedure. However, as with any procedure, complications can occur. Possible complications include:  · Bleeding.  · Infection.  · Missed lesions.  BEFORE THE PROCEDURE   · Tell your health care provider if you have your menstrual period. A colposcopy typically is not done during menstruation.  · For 24 hours before the colposcopy, do not:    [place]ouche.    Use tampons.    Use medicines, creams, or suppositories in the vagina.    Have sexual intercourse.  PROCEDURE   During the procedure, you will be lying on your back with your feet in foot rests (stirrups). A warm  metal or plastic instrument (speculum) will be placed in your vagina to keep it open and to allow the health care provider to see the cervix. The colposcope will be placed outside the vagina. It will be used to magnify and examine the cervix, vagina, and the area around the outside of the vagina. A small amount of liquid solution will be placed on the area that is to be viewed. This solution will make it easier to see the abnormal cells. Your health care provider will use tools to suck out mucus and cells from the canal of the cervix. Then he or she will record the location of the abnormal areas.  If a biopsy is done during the procedure, a medicine will usually be given to numb the area (local anesthetic). You may feel mild pain or cramping while the biopsy is done. After the procedure, tissue samples collected during the biopsy will be sent to a lab for analysis.  AFTER THE PROCEDURE   You will be given instructions on when to follow up with your health care provider for your test results. It is important to keep your appointment.     This information is not intended to replace advice given to you by your health care provider. Make sure you discuss any questions you have with your health care provider.     Document Released: 05/06/2002 Document Revised: 10/16/2012 Document Reviewed: 09/12/2012    Elsevier Interactive Patient Education ©2016 [employer]

## 2015-06-17 ENCOUNTER — Encounter: Payer: Medicaid Other | Admitting: Family Medicine

## 2015-08-02 ENCOUNTER — Ambulatory Visit (INDEPENDENT_AMBULATORY_CARE_PROVIDER_SITE_OTHER): Payer: Medicaid Other | Admitting: *Deleted

## 2015-08-02 VITALS — BP 110/67 | HR 60

## 2015-08-02 DIAGNOSIS — Z3042 Encounter for surveillance of injectable contraceptive: Secondary | ICD-10-CM | POA: Diagnosis not present

## 2015-08-02 MED ORDER — MEDROXYPROGESTERONE ACETATE 150 MG/ML IM SUSP
150.0000 mg | Freq: Once | INTRAMUSCULAR | Status: AC
  Administered 2015-08-02: 150 mg via INTRAMUSCULAR

## 2015-10-25 ENCOUNTER — Ambulatory Visit: Payer: Medicaid Other

## 2015-11-10 ENCOUNTER — Ambulatory Visit (INDEPENDENT_AMBULATORY_CARE_PROVIDER_SITE_OTHER): Payer: Medicaid Other | Admitting: *Deleted

## 2015-11-10 VITALS — BP 113/60 | HR 55

## 2015-11-10 DIAGNOSIS — Z3202 Encounter for pregnancy test, result negative: Secondary | ICD-10-CM | POA: Diagnosis not present

## 2015-11-10 DIAGNOSIS — Z3042 Encounter for surveillance of injectable contraceptive: Secondary | ICD-10-CM | POA: Diagnosis present

## 2015-11-10 LAB — POCT PREGNANCY, URINE: PREG TEST UR: NEGATIVE

## 2015-11-10 MED ORDER — MEDROXYPROGESTERONE ACETATE 150 MG/ML IM SUSP
150.0000 mg | Freq: Once | INTRAMUSCULAR | Status: AC
  Administered 2015-11-10: 150 mg via INTRAMUSCULAR

## 2015-11-10 NOTE — Progress Notes (Signed)
Here for depo- provera - has been 14 wk 2 day since last shot. Denies intercourse for about 4 weeks and used condom then. Negative urine pregnancy test today, will restart depo- provera. Instructed patient to use back up method ( condoms) for 2 weeks.Patient voices understanding and states may switch to [facility] which is closer to home.

## 2015-12-24 ENCOUNTER — Emergency Department (HOSPITAL_COMMUNITY): Payer: Medicaid Other

## 2015-12-24 ENCOUNTER — Encounter (HOSPITAL_COMMUNITY): Payer: Self-pay

## 2015-12-24 ENCOUNTER — Emergency Department (HOSPITAL_COMMUNITY)
Admission: EM | Admit: 2015-12-24 | Discharge: 2015-12-24 | Disposition: A | Discharge status: Home / Self Care | Payer: Medicaid Other | Attending: Emergency Medicine | Admitting: Emergency Medicine

## 2015-12-24 DIAGNOSIS — R079 Chest pain, unspecified: Secondary | ICD-10-CM | POA: Diagnosis present

## 2015-12-24 DIAGNOSIS — F17210 Nicotine dependence, cigarettes, uncomplicated: Secondary | ICD-10-CM | POA: Insufficient documentation

## 2015-12-24 DIAGNOSIS — R0789 Other chest pain: Secondary | ICD-10-CM | POA: Diagnosis not present

## 2015-12-24 LAB — BASIC METABOLIC PANEL WITH GFR
Anion gap: 6 (ref 5–15)
BUN: 17 mg/dL (ref 6–20)
CO2: 26 mmol/L (ref 22–32)
Calcium: 9.5 mg/dL (ref 8.9–10.3)
Chloride: 107 mmol/L (ref 101–111)
Creatinine, Ser: 0.97 mg/dL (ref 0.44–1.00)
GFR calc Af Amer: >60 mL/min
GFR calc non Af Amer: >60 mL/min
Glucose, Bld: 94 mg/dL (ref 65–99)
Potassium: 3.5 mmol/L (ref 3.5–5.1)
Sodium: 139 mmol/L (ref 135–145)

## 2015-12-24 LAB — CBC
HEMATOCRIT: 37.7 % (ref 36.0–46.0)
Hemoglobin: 12.4 g/dL (ref 12.0–15.0)
MCH: 30.2 pg (ref 26.0–34.0)
MCHC: 32.9 g/dL (ref 30.0–36.0)
MCV: 92 fL (ref 78.0–100.0)
PLATELETS: 376 10*3/uL (ref 150–400)
RBC: 4.1 MIL/uL (ref 3.87–5.11)
RDW: 13.6 % (ref 11.5–15.5)
WBC: 5.5 10*3/uL (ref 4.0–10.5)

## 2015-12-24 LAB — I-STAT TROPONIN, ED: TROPONIN I, POC: 0.01 ng/mL (ref 0.00–0.08)

## 2015-12-24 MED ORDER — CYCLOBENZAPRINE HCL 5 MG PO TABS: 5.0000 mg | ORAL_TABLET | Freq: Three times a day (TID) | ORAL | 0 refills | Status: DC | PRN

## 2015-12-24 NOTE — ED Provider Notes (Signed)
[facility] DEPT Provider Note   CSN: [id number] Arrival date & time: 12/24/15  1556     History   Chief Complaint Chief Complaint  Patient presents with  . Chest Pain    HPI Katie Reed is a 35 y.o. female.  The history is provided by the patient. No language interpreter was used.  Chest Pain     Katie Reed is a 35 y.o. female who presents to the Emergency Department complaining of Chest pain. She states she felt like she pulled a muscle in her right back and right chest about a month ago. Yesterday she felt like she had a catch in her right side and today she had difficulty rolling over and getting out of bed. Pain is worse with bending and moving. She has a mild cough. No fever, shortness of breath, abdominal pain, nausea, vomiting, leg swelling or pain. Past Medical History:  Diagnosis Date  . Anxiety   . Depression   . Preterm labor     Patient Active Problem List   Diagnosis Date Noted  . Active labor 04/01/2015  . ASCUS with positive high risk HPV 03/23/2015  . Supervision of high-risk pregnancy 12/31/2014  . High risk pregnancy due to history of preterm labor 12/31/2014  . AMA (advanced maternal age) multigravida 35+ 12/31/2014  . Obesity in pregnancy 12/31/2014  . Tobacco smoking affecting pregnancy, antepartum 12/31/2014  . Abnormal glucose tolerance test (GTT) during pregnancy, antepartum 12/31/2014    Past Surgical History:  Procedure Laterality Date  . NO PAST SURGERIES    . THERAPEUTIC ABORTION     elective    OB History    Gravida Para Term Preterm AB Living   6 5 4 1 1 5    SAB TAB Ectopic Multiple Live Births   0 1 0 0 5       Home Medications    Prior to Admission medications   Medication Sig Start Date End Date Taking? Authorizing Provider  ibuprofen (ADVIL,MOTRIN) 600 MG tablet Take 1 tablet (600 mg total) by mouth every 6 (six) hours as needed. Patient taking differently: Take 600 mg by mouth every 6 (six) hours as  needed for moderate pain.  04/03/15  Yes Myrtis Ser, CNM  cyclobenzaprine (FLEXERIL) 5 MG tablet Take 1 tablet (5 mg total) by mouth 3 (three) times daily as needed for muscle spasms. 12/24/15   Quintella Reichert, MD  medroxyPROGESTERone (DEPO-PROVERA) 150 MG/ML injection Inject 1 mL (150 mg total) into the muscle every 3 (three) months. Patient not taking: Reported on 12/24/2015 05/17/15   Lavonia Drafts, MD    Family History Family History  Problem Relation Age of Onset  . Hypertension Mother   . Alcohol abuse Neg Hx   . Arthritis Neg Hx   . Asthma Neg Hx   . Birth defects Neg Hx   . Cancer Neg Hx   . COPD Neg Hx   . Depression Neg Hx   . Diabetes Neg Hx   . Drug abuse Neg Hx   . Early death Neg Hx   . Hearing loss Neg Hx   . Heart disease Neg Hx   . Hyperlipidemia Neg Hx   . Kidney disease Neg Hx   . Learning disabilities Neg Hx   . Mental illness Neg Hx   . Mental retardation Neg Hx   . Miscarriages / Stillbirths Neg Hx   . Stroke Neg Hx   . Vision loss Neg Hx   . Varicose  Veins Neg Hx     Social History Social History  Substance Use Topics  . Smoking status: Current Some Day Smoker    Packs/day: 0.25    Years: 14.00    Types: Cigarettes  . Smokeless tobacco: Never Used  . Alcohol use 0.6 oz/week    1 Glasses of wine per week     Comment: 1-2 times per week     Allergies   Review of patient's allergies indicates no known allergies.   Review of Systems Review of Systems  Cardiovascular: Positive for chest pain.  All other systems reviewed and are negative.    Physical Exam Updated Vital Signs BP 112/81   Pulse 66   Temp 98.9 F (37.2 C) (Oral)   Resp 23   Ht 5\' 3"  (1.6 m)   Wt 165 lb (74.8 kg)   SpO2 100%   BMI 29.23 kg/m   Physical Exam  Constitutional: She is oriented to person, place, and time. She appears well-developed and well-nourished.  HENT:  Head: Normocephalic and atraumatic.  Cardiovascular: Normal rate and regular  rhythm.   No murmur heard. Pulmonary/Chest: Effort normal and breath sounds normal. No respiratory distress.  Mild right sided chest wall tenderness  Abdominal: Soft. There is no tenderness. There is no rebound and no guarding.  Musculoskeletal: She exhibits no edema or tenderness.  Neurological: She is alert and oriented to person, place, and time.  Skin: Skin is warm and dry.  Psychiatric: She has a normal mood and affect. Her behavior is normal.  Nursing note and vitals reviewed.    ED Treatments / Results  Labs (all labs ordered are listed, but only abnormal results are displayed) Labs Reviewed  [place], ED    EKG  EKG Interpretation  Date/Time:  Friday December 24 2015 16:23:04 EDT Ventricular Rate:  68 PR Interval:  138 QRS Duration: 102 QT Interval:  400 QTC Calculation: 425 R Axis:   83 Text Interpretation:  Normal sinus rhythm with sinus arrhythmia Normal ECG Confirmed by Hazle Coca [phone]) on 12/24/2015 8:20:26 PM       Radiology Dg Chest 2 View  Result Date: 12/24/2015 CLINICAL DATA:  Right-sided chest pain and cough EXAM: CHEST  2 VIEW COMPARISON:  10/03/2005 FINDINGS: The heart size and mediastinal contours are within normal limits. Both lungs are clear. The visualized skeletal structures are unremarkable. IMPRESSION: No active cardiopulmonary disease. Electronically Signed   By: Donavan Foil M.D.   On: 12/24/2015 18:12    Procedures Procedures (including critical care time)  Medications Ordered in ED Medications - No data to display   Initial Impression / Assessment and Plan / ED Course  I have reviewed the triage vital signs and the nursing notes.  Pertinent labs & imaging results that were available during my care of the patient were reviewed by me and considered in my medical decision making (see chart for details).  Clinical Course    Patient here for evaluation of chest pain that is reproducible  examination. Presentation is not consistent with ACS, PE, dissection, cholecystitis. Discussed the patient home care for musculoskeletal chest pain with ibuprofen prn. Will provide muscle relaxant that she can use as needed. Discussed outpatient follow-up and return precautions.  Final Clinical Impressions(s) / ED Diagnoses   Final diagnoses:  Chest wall pain    New Prescriptions Discharge Medication List as of 12/24/2015  8:35 PM    START taking these medications   Details  cyclobenzaprine (FLEXERIL) 5 MG tablet Take 1 tablet (5 mg total) by mouth 3 (three) times daily as needed for muscle spasms., Starting Fri 12/24/2015, Print         Quintella Reichert, MD 12/25/15 1355

## 2015-12-24 NOTE — ED Triage Notes (Signed)
Per Pt, Pt notice to have chest pain that started one month ago. Pt reports having it worsen with associated back pain now. Worsens with cough or movement. Reports dry cough.

## 2016-07-26 IMAGING — US US MFM OB DETAIL+14 WK
1 series · 14 of 28 positions shown · non-contrast
Comparison: none

[Series 1: us mfm ob detail+14 wk · 14 of 86 slices shown]
[im 4/86]
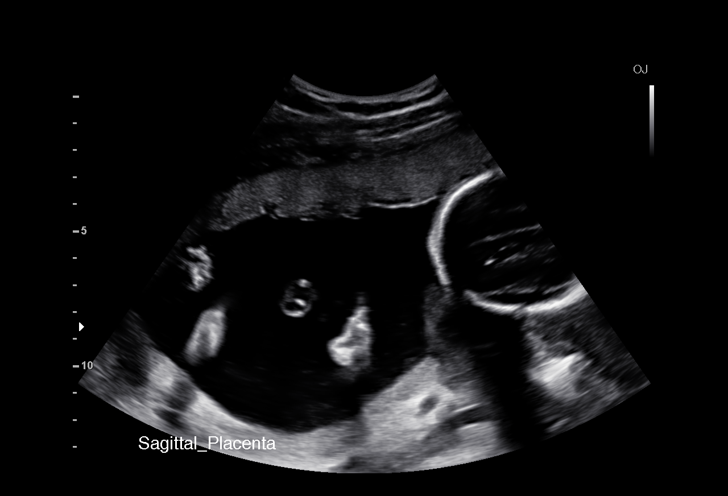
[im 10/86]
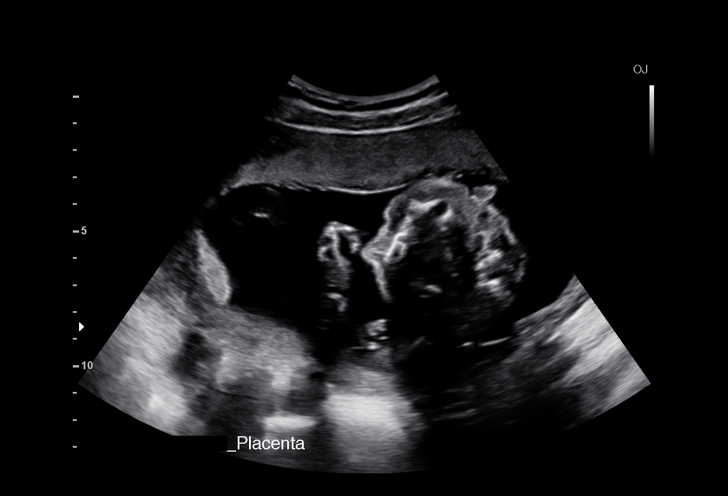
[im 16/86]
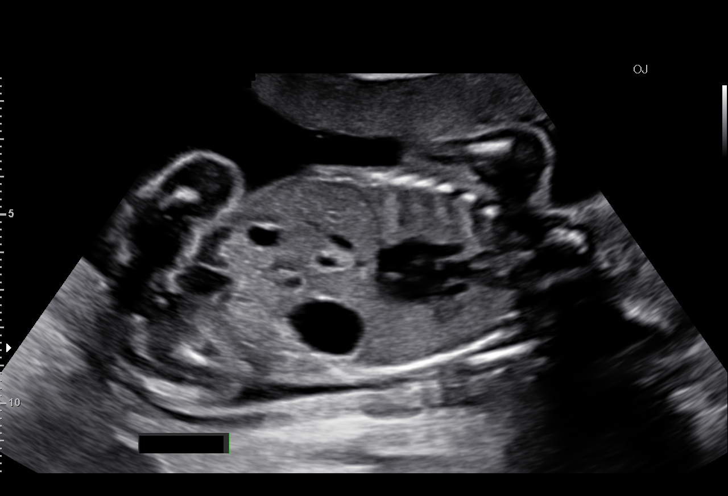
[im 23/86]
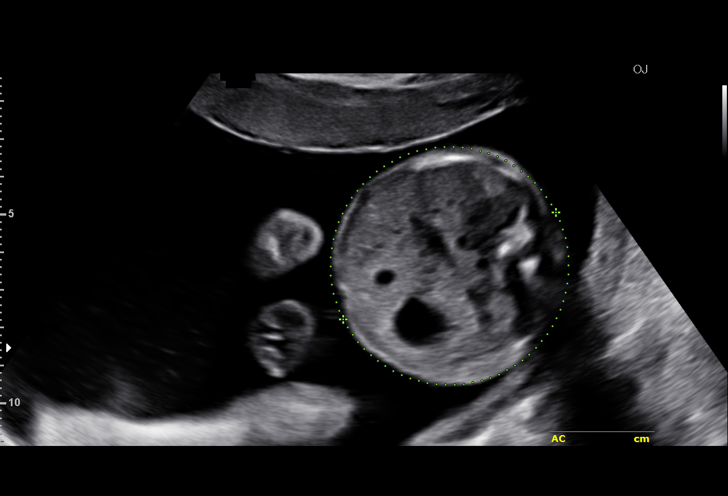
[im 29/86]
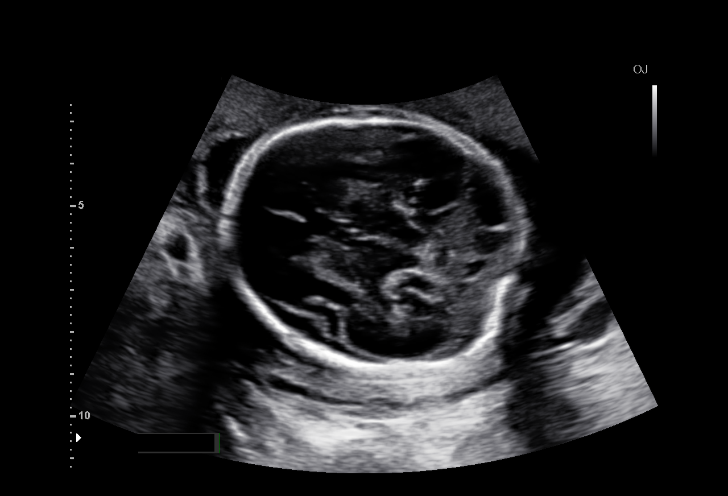
[im 35/86]
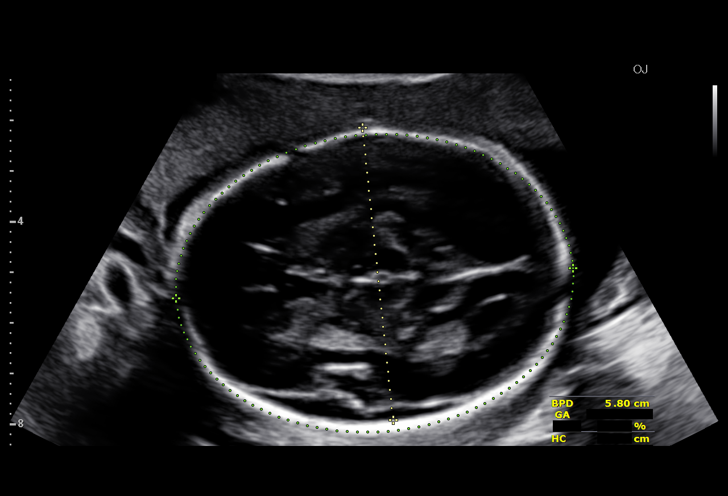
[im 41/86]
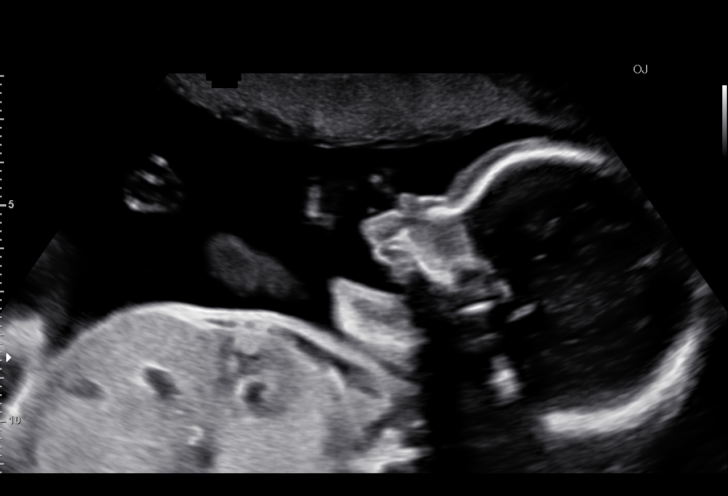
[im 48/86]
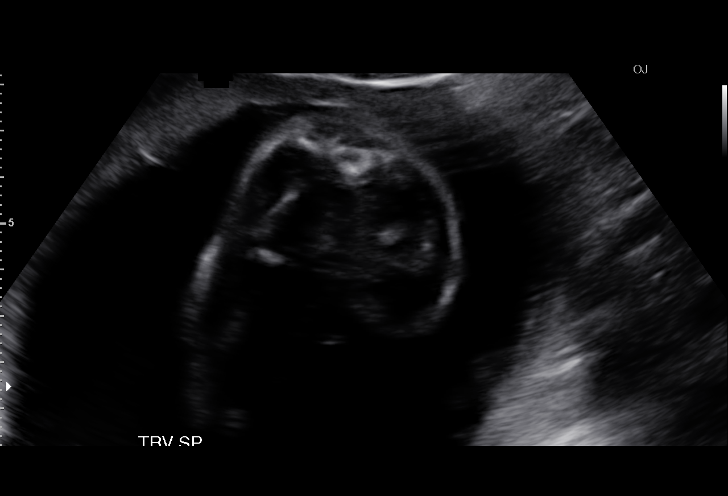
[im 54/86]
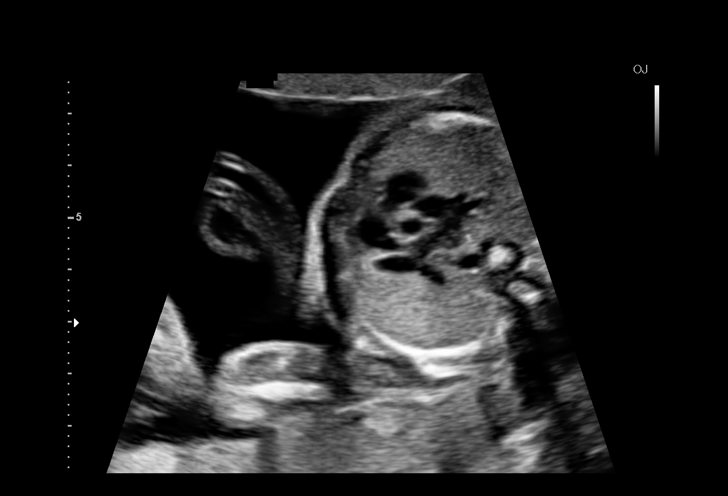
[im 60/86]
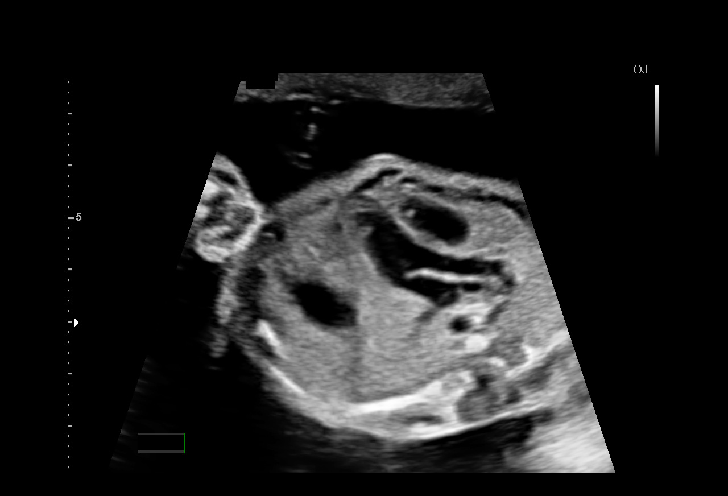
[im 67/86]
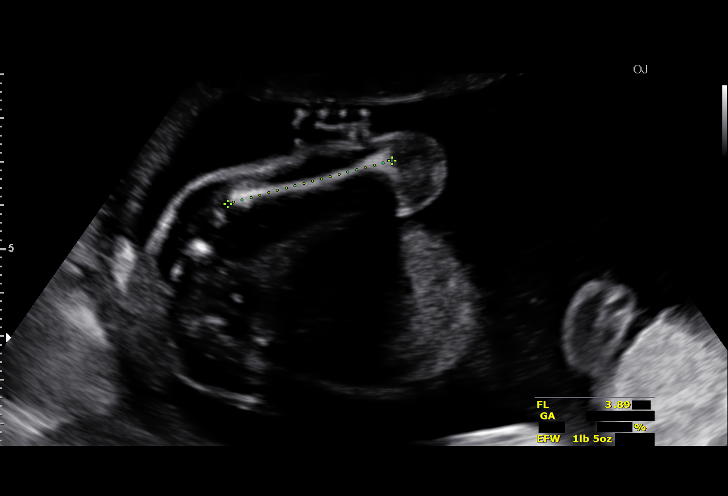
[im 73/86]
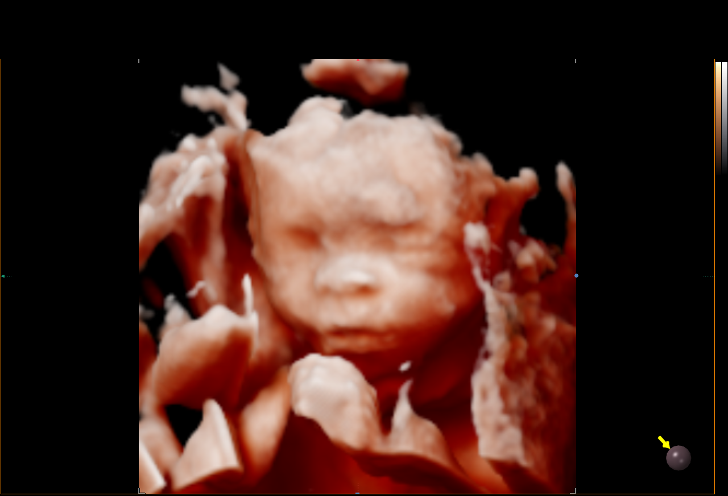
[im 79/86]
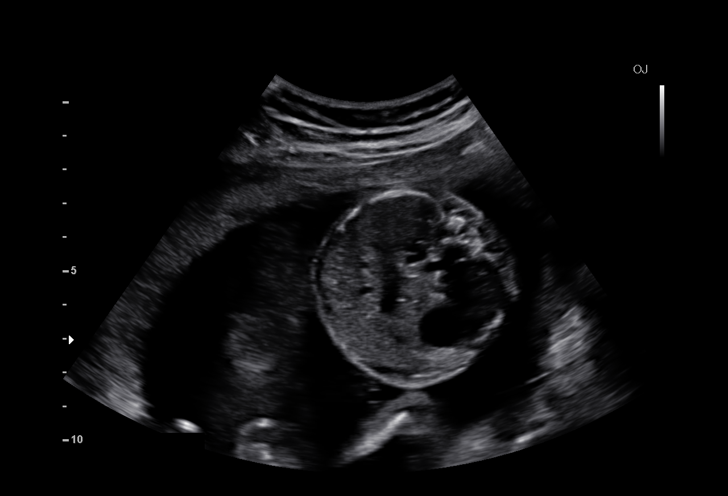
[im 86/86]
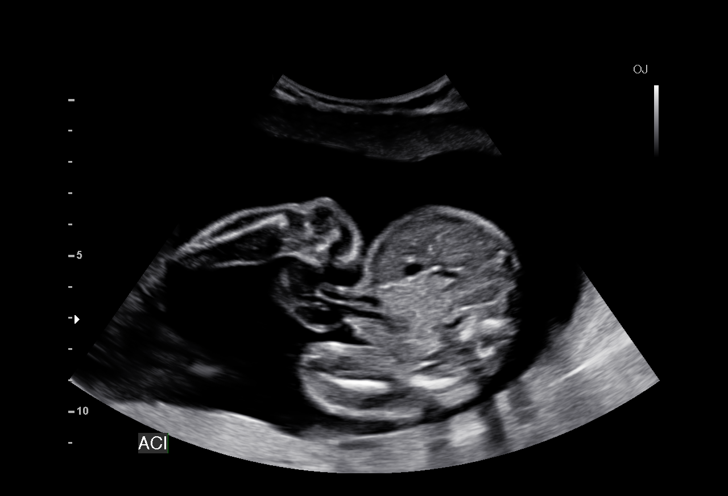

[14 of 28 positions shown; findings below may reference images not displayed]

OBSTETRICS REPORT
(Signed Final 12/23/2014 [DATE])

Date:

By:
Service(s) Provided

Indications

23 weeks gestation of pregnancy
Detailed fetal anatomic survey                         Z36
Advanced maternal age multigravida 35+, second
trimester(declined further testing)
Poor obstetric history: Previous preterm delivery,
antepartum (35 weeks)
Fetal Evaluation

Num Of             1
Fetuses:
Fetal Heart        152                          bpm
Rate:
Cardiac Activity:  Observed
Presentation:      Cephalic
Placenta:          Anterior, above cervical
os
P. Cord            Visualized
Insertion:

Amniotic Fluid
AFI FV:      Subjectively upper-normal
Larg Pckt:      7.3  cm
Biometry

BPD:     57.3   m    G. Age:   23w 4d                 CI:           73   70 - 86
m
FL/HC:      18.7   18.7 -
20.9
HC:     213.2   m    G. Age:   23w 3d        27  %    HC/AC:      1.08   1.05 -
m
AC:     196.8   m    G. Age:   24w 3d        66  %    FL/BPD      69.6   71 - 87
m                                     :
FL:      39.9   m    G. Age:   22w 6d        19  %    FL/AC:      20.3   20 - 24
m
HUM:     36.1   m    G. Age:   22w 4d        19  %
m

Est.         617   gm    1 lb 6 oz      54   %
FW:
Gestational Age

LMP:           23w 4d        Date:  07/11/14                  EDD:   04/17/15
U/S Today:     23w 4d                                         EDD:   04/17/15
Best:          23w 4d    Det. By:   LMP  (07/11/14)           EDD:   04/17/15
Anatomy

Cranium:          Appears normal         Aortic Arch:       Appears normal
Fetal Cavum:      Appears normal         Ductal Arch:       Appears normal
Ventricles:       Appears normal         Diaphragm:         Appears normal
Choroid Plexus:   Appears normal         Stomach:           Appears normal,
left sided
Cerebellum:       Appears normal         Abdomen:           Appears normal
Posterior         Appears normal         Abdominal          Appears nml (cord
Fossa:                                   Wall:              insert, abd wall)
Nuchal Fold:      Not applicable (>20    Cord Vessels:      Appears normal (3
wks GA)                                   vessel cord)
Face:             Appears normal         Kidneys:           Appear normal
(orbits and profile)
Lips:             Appears normal         Bladder:           Appears normal
Heart:            Appears normal         Spine:             Appears normal
(4CH, axis, and
situs)
RVOT:             Appears normal         Lower              Appears normal
Extremities:
LVOT:             Appears normal         Upper              Appears normal
Extremities:

Other:   Male gender. Heels visualized. Technically difficult due to fetal
position.
Cervix Uterus Adnexa

Cervical Length:    4         cm

Cervix:       Normal appearance by transabdominal scan.

Left Ovary:    Within normal limits.
Right Ovary:   Within normal limits.
Impression

Single IUP at 23w 4d
Advanced maternal age, hx of previous preterm delivery at
35 weeks
Normal fetal anatomic survey
Anterior placenta without previa
Normal amniotic fluid volume
Cervical length 4 cm (transabdominal)
Recommendations

With history of prior preterm delivery, Ms. Vernice is a
candidate for 17-P injections ([facility]) - would discuss at
next prenatal visit
Consider follow up ultrasound in the 3rd trimester for growth
given advanced maternal age, otherwise, follow up
ultrasounds as clinically indicated

## 2016-10-01 ENCOUNTER — Emergency Department (HOSPITAL_COMMUNITY): Payer: Self-pay

## 2016-10-01 ENCOUNTER — Emergency Department (HOSPITAL_COMMUNITY)
Admission: EM | Admit: 2016-10-01 | Discharge: 2016-10-01 | Disposition: A | Discharge status: Home / Self Care | Payer: Self-pay | Attending: Emergency Medicine | Admitting: Emergency Medicine

## 2016-10-01 ENCOUNTER — Encounter (HOSPITAL_COMMUNITY): Payer: Self-pay

## 2016-10-01 DIAGNOSIS — Y92003 Bedroom of unspecified non-institutional (private) residence as the place of occurrence of the external cause: Secondary | ICD-10-CM | POA: Insufficient documentation

## 2016-10-01 DIAGNOSIS — F17210 Nicotine dependence, cigarettes, uncomplicated: Secondary | ICD-10-CM | POA: Insufficient documentation

## 2016-10-01 DIAGNOSIS — Y9384 Activity, sleeping: Secondary | ICD-10-CM | POA: Insufficient documentation

## 2016-10-01 DIAGNOSIS — Y999 Unspecified external cause status: Secondary | ICD-10-CM | POA: Insufficient documentation

## 2016-10-01 DIAGNOSIS — S0003XA Contusion of scalp, initial encounter: Secondary | ICD-10-CM | POA: Insufficient documentation

## 2016-10-01 MED ORDER — METHOCARBAMOL 500 MG PO TABS
1000.0000 mg | ORAL_TABLET | Freq: Three times a day (TID) | ORAL | Status: DC
  Administered 2016-10-01: 1000 mg via ORAL
  Filled 2016-10-01: qty 2

## 2016-10-01 MED ORDER — NAPROXEN 500 MG PO TABS
500.0000 mg | ORAL_TABLET | Freq: Once | ORAL | Status: AC
  Administered 2016-10-01: 500 mg via ORAL
  Filled 2016-10-01: qty 1
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
## 2016-10-01 NOTE — ED Notes (Signed)
Pt attempted to obtain urine sample but states that she thought she had to go, but was unable.

## 2016-10-01 NOTE — Discharge Instructions (Signed)
Use ice over the painful area. Take ibuprofen 600 mg 4 times a day for pain. Return to the ED if you have any problems listed on the head injury sheet.
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
## 2017-02-19 ENCOUNTER — Observation Stay (HOSPITAL_COMMUNITY): Payer: Self-pay | Admitting: Anesthesiology

## 2017-02-19 ENCOUNTER — Emergency Department (HOSPITAL_COMMUNITY): Payer: Self-pay

## 2017-02-19 ENCOUNTER — Other Ambulatory Visit: Payer: Self-pay

## 2017-02-19 ENCOUNTER — Encounter (HOSPITAL_COMMUNITY)
Admission: EM | Disposition: A | Discharge status: Home / Self Care | Payer: Self-pay | Source: Home / Self Care | Attending: Emergency Medicine

## 2017-02-19 ENCOUNTER — Observation Stay (HOSPITAL_COMMUNITY)
Admission: EM | Admit: 2017-02-19 | Discharge: 2017-02-20 | Disposition: A | Discharge status: Home / Self Care | Payer: Self-pay | Attending: Internal Medicine | Admitting: Internal Medicine

## 2017-02-19 ENCOUNTER — Encounter (HOSPITAL_COMMUNITY): Payer: Self-pay | Admitting: Emergency Medicine

## 2017-02-19 DIAGNOSIS — F17210 Nicotine dependence, cigarettes, uncomplicated: Secondary | ICD-10-CM | POA: Insufficient documentation

## 2017-02-19 DIAGNOSIS — Z79899 Other long term (current) drug therapy: Secondary | ICD-10-CM | POA: Insufficient documentation

## 2017-02-19 DIAGNOSIS — K561 Intussusception: Principal | ICD-10-CM | POA: Insufficient documentation

## 2017-02-19 DIAGNOSIS — R109 Unspecified abdominal pain: Secondary | ICD-10-CM | POA: Insufficient documentation

## 2017-02-19 DIAGNOSIS — K8020 Calculus of gallbladder without cholecystitis without obstruction: Secondary | ICD-10-CM | POA: Insufficient documentation

## 2017-02-19 DIAGNOSIS — K648 Other hemorrhoids: Secondary | ICD-10-CM | POA: Insufficient documentation

## 2017-02-19 DIAGNOSIS — Z8249 Family history of ischemic heart disease and other diseases of the circulatory system: Secondary | ICD-10-CM | POA: Insufficient documentation

## 2017-02-19 DIAGNOSIS — R1011 Right upper quadrant pain: Secondary | ICD-10-CM

## 2017-02-19 DIAGNOSIS — K621 Rectal polyp: Secondary | ICD-10-CM | POA: Insufficient documentation

## 2017-02-19 DIAGNOSIS — F419 Anxiety disorder, unspecified: Secondary | ICD-10-CM | POA: Insufficient documentation

## 2017-02-19 DIAGNOSIS — F32A Depression, unspecified: Secondary | ICD-10-CM | POA: Diagnosis present

## 2017-02-19 DIAGNOSIS — F329 Major depressive disorder, single episode, unspecified: Secondary | ICD-10-CM | POA: Insufficient documentation

## 2017-02-19 DIAGNOSIS — K912 Postsurgical malabsorption, not elsewhere classified: Secondary | ICD-10-CM | POA: Insufficient documentation

## 2017-02-19 DIAGNOSIS — D125 Benign neoplasm of sigmoid colon: Secondary | ICD-10-CM | POA: Insufficient documentation

## 2017-02-19 HISTORY — DX: Intussusception: K56.1

## 2017-02-19 HISTORY — PX: FLEXIBLE SIGMOIDOSCOPY: SHX5431

## 2017-02-19 LAB — COMPREHENSIVE METABOLIC PANEL
ALBUMIN: 4.1 g/dL (ref 3.5–5.0)
ALT: 38 U/L (ref 14–54)
AST: 31 U/L (ref 15–41)
Alkaline Phosphatase: 42 U/L (ref 38–126)
Anion gap: 8 (ref 5–15)
BUN: 20 mg/dL (ref 6–20)
CHLORIDE: 104 mmol/L (ref 101–111)
CO2: 21 mmol/L — AB (ref 22–32)
CREATININE: 0.89 mg/dL (ref 0.44–1.00)
Calcium: 9.4 mg/dL (ref 8.9–10.3)
GFR calc Af Amer: >60 mL/min (ref >60)
GLUCOSE: 98 mg/dL (ref 65–99)
Potassium: 3.6 mmol/L (ref 3.5–5.1)
Sodium: 133 mmol/L — ABNORMAL LOW (ref 135–145)
Total Bilirubin: 0.5 mg/dL (ref 0.3–1.2)
Total Protein: 7.4 g/dL (ref 6.5–8.1)

## 2017-02-19 LAB — URINALYSIS, ROUTINE W REFLEX MICROSCOPIC
BILIRUBIN URINE: NEGATIVE
Glucose, UA: NEGATIVE mg/dL
KETONES UR: NEGATIVE mg/dL
Nitrite: NEGATIVE
PROTEIN: NEGATIVE mg/dL
Specific Gravity, Urine: 1.018 (ref 1.005–1.030)
pH: 7 (ref 5.0–8.0)

## 2017-02-19 LAB — I-STAT BETA HCG BLOOD, ED (MC, WL, AP ONLY)

## 2017-02-19 LAB — CBC
HCT: 37.3 % (ref 36.0–46.0)
Hemoglobin: 12.5 g/dL (ref 12.0–15.0)
MCH: 30.4 pg (ref 26.0–34.0)
MCHC: 33.5 g/dL (ref 30.0–36.0)
MCV: 90.8 fL (ref 78.0–100.0)
PLATELETS: 322 10*3/uL (ref 150–400)
RBC: 4.11 MIL/uL (ref 3.87–5.11)
RDW: 12.9 % (ref 11.5–15.5)
WBC: 9.5 10*3/uL (ref 4.0–10.5)

## 2017-02-19 LAB — LIPASE, BLOOD: LIPASE: 34 U/L (ref 11–51)

## 2017-02-19 SURGERY — SIGMOIDOSCOPY, FLEXIBLE
Anesthesia: Monitor Anesthesia Care

## 2017-02-19 MED ORDER — SODIUM CHLORIDE 0.9 % IV SOLN: INTRAVENOUS | Status: DC

## 2017-02-19 MED ORDER — PROPOFOL 10 MG/ML IV BOLUS
INTRAVENOUS | Status: DC | PRN
  Administered 2017-02-19: 40 mg via INTRAVENOUS
  Administered 2017-02-19: 20 mg via INTRAVENOUS
  Administered 2017-02-19 (×3): 40 mg via INTRAVENOUS
  Administered 2017-02-19 (×2): 20 mg via INTRAVENOUS

## 2017-02-19 MED ORDER — PROPOFOL 10 MG/ML IV BOLUS
INTRAVENOUS | Status: AC
  Filled 2017-02-19: qty 40

## 2017-02-19 MED ORDER — ONDANSETRON HCL 4 MG PO TABS: 4.0000 mg | ORAL_TABLET | Freq: Four times a day (QID) | ORAL | Status: DC | PRN

## 2017-02-19 MED ORDER — LACTATED RINGERS IV SOLN
INTRAVENOUS | Status: DC | PRN
  Administered 2017-02-19: 12:00:00 via INTRAVENOUS

## 2017-02-19 MED ORDER — ONDANSETRON HCL 4 MG/2ML IJ SOLN: 4.0000 mg | Freq: Once | INTRAMUSCULAR | Status: DC

## 2017-02-19 MED ORDER — ONDANSETRON HCL 4 MG/2ML IJ SOLN: 4.0000 mg | Freq: Four times a day (QID) | INTRAMUSCULAR | Status: DC | PRN

## 2017-02-19 MED ORDER — KETOROLAC TROMETHAMINE 30 MG/ML IJ SOLN
30.0000 mg | Freq: Four times a day (QID) | INTRAMUSCULAR | Status: DC | PRN
  Administered 2017-02-19: 30 mg via INTRAVENOUS
  Filled 2017-02-19: qty 1

## 2017-02-19 MED ORDER — MORPHINE SULFATE (PF) 4 MG/ML IV SOLN
4.0000 mg | Freq: Once | INTRAVENOUS | Status: AC
  Administered 2017-02-19: 4 mg via INTRAVENOUS
  Filled 2017-02-19: qty 1

## 2017-02-19 MED ORDER — LORAZEPAM 2 MG/ML IJ SOLN
0.5000 mg | Freq: Once | INTRAMUSCULAR | Status: AC
  Administered 2017-02-19: 0.5 mg via INTRAVENOUS
  Filled 2017-02-19: qty 1

## 2017-02-19 MED ORDER — MORPHINE SULFATE (PF) 2 MG/ML IV SOLN
2.0000 mg | INTRAVENOUS | Status: DC | PRN
  Administered 2017-02-19: 2 mg via INTRAVENOUS
  Filled 2017-02-19: qty 1

## 2017-02-19 MED ORDER — IOPAMIDOL (ISOVUE-300) INJECTION 61%
INTRAVENOUS | Status: AC
  Administered 2017-02-19: 100 mL via INTRAVENOUS
  Filled 2017-02-19: qty 100

## 2017-02-19 MED ORDER — KETOROLAC TROMETHAMINE 30 MG/ML IJ SOLN: 30.0000 mg | Freq: Once | INTRAMUSCULAR | Status: DC

## 2017-02-19 MED ORDER — SPOT INK MARKER SYRINGE KIT
PACK | SUBMUCOSAL | Status: AC
  Filled 2017-02-19: qty 5

## 2017-02-19 MED ORDER — SODIUM CHLORIDE 0.9 % IV SOLN
INTRAVENOUS | Status: DC
  Administered 2017-02-19 – 2017-02-20 (×2): via INTRAVENOUS

## 2017-02-19 MED ORDER — SPOT INK MARKER SYRINGE KIT
PACK | SUBMUCOSAL | Status: DC | PRN
  Administered 2017-02-19: 1 mL via SUBMUCOSAL

## 2017-02-19 MED ORDER — FENTANYL CITRATE (PF) 100 MCG/2ML IJ SOLN
50.0000 ug | Freq: Once | INTRAMUSCULAR | Status: AC
  Administered 2017-02-19: 50 ug via INTRAVENOUS
  Filled 2017-02-19: qty 2

## 2017-02-19 NOTE — ED Provider Notes (Signed)
[facility] Provider Note   CSN: [id number] Arrival date & time: 02/19/17  7510     History   Chief Complaint Chief Complaint  Patient presents with  . Abdominal Pain    HPI Katie Reed is a 36 y.o. female who presents to the emergency department with a chief complaint of sudden onset, constant, severe, worsening right upper quadrant pain that awoke her from sleep this morning at 5 AM.  Her significant other reports they went to bed about 3 AM, and she was having no symptoms.  She denies fever, chills, nausea, vomiting, diarrhea, back pain, dysuria, vaginal pain or itching dyspnea, or chest pain.  No treatment prior to arrival.  No history of similar.  She reports that she is currently sexually active with one female partner.  She is not currently using birth control. LMP 09/27/16.  She reports that she had one alcoholic beverage last night.  No change in her diet yesterday.   The history is provided by the patient. No language interpreter was used.  Abdominal Pain   Pertinent negatives include fever, diarrhea, vomiting, constipation, dysuria, hematuria and headaches.    Past Medical History:  Diagnosis Date  . Anxiety   . Depression   . Preterm labor     Patient Active Problem List   Diagnosis Date Noted  . Intussusception intestine ([place]) 02/19/2017  . Depression   . Anxiety   . Active labor 04/01/2015  . ASCUS with positive high risk HPV 03/23/2015  . Supervision of high-risk pregnancy 12/31/2014  . High risk pregnancy due to history of preterm labor 12/31/2014  . AMA (advanced maternal age) multigravida 35+ 12/31/2014  . Obesity in pregnancy 12/31/2014  . Tobacco smoking affecting pregnancy, antepartum 12/31/2014  . Abnormal glucose tolerance test (GTT) during pregnancy, antepartum 12/31/2014    Past Surgical History:  Procedure Laterality Date  . NO PAST SURGERIES    . THERAPEUTIC ABORTION     elective    OB History    Gravida Para Term Preterm AB Living   6 5 4 1 1 5    SAB TAB Ectopic Multiple Live Births   0 1 0 0 5       Home Medications    Prior to Admission medications   Medication Sig Start Date End Date Taking? Authorizing Provider  acetaminophen (TYLENOL) 500 MG tablet Take 500 mg by mouth every 6 (six) hours as needed for mild pain.   Yes [provider]  cyclobenzaprine (FLEXERIL) 5 MG tablet Take 1 tablet (5 mg total) by mouth 3 (three) times daily as needed for muscle spasms. Patient not taking: Reported on 02/19/2017 12/24/15   Quintella Reichert, MD  ibuprofen (ADVIL,MOTRIN) 600 MG tablet Take 1 tablet (600 mg total) by mouth every 6 (six) hours as needed. Patient not taking: Reported on 02/19/2017 04/03/15   Myrtis Ser, CNM  medroxyPROGESTERone (DEPO-PROVERA) 150 MG/ML injection Inject 1 mL (150 mg total) into the muscle every 3 (three) months. Patient not taking: Reported on 12/24/2015 05/17/15   Lavonia Drafts, MD    Family History Family History  Problem Relation Age of Onset  . Hypertension Mother   . Alcohol abuse Neg Hx   . Arthritis Neg Hx   . Asthma Neg Hx   . Birth defects Neg Hx   . Cancer Neg Hx   . COPD Neg Hx   . Depression Neg Hx   . Diabetes Neg Hx   . Drug abuse Neg  Hx   . Early death Neg Hx   . Hearing loss Neg Hx   . Heart disease Neg Hx   . Hyperlipidemia Neg Hx   . Kidney disease Neg Hx   . Learning disabilities Neg Hx   . Mental illness Neg Hx   . Mental retardation Neg Hx   . Miscarriages / Stillbirths Neg Hx   . Stroke Neg Hx   . Vision loss Neg Hx   . Varicose Veins Neg Hx     Social History Social History   Tobacco Use  . Smoking status: Current Some Day Smoker    Packs/day: 0.25    Years: 14.00    Pack years: 3.50    Types: Cigarettes  . Smokeless tobacco: Never Used  Substance Use Topics  . Alcohol use: Yes    Alcohol/week: 0.6 oz    Types: 1 Glasses of wine per week    Comment: 1-2 times per week  . Drug use:  No     Allergies   Patient has no known allergies.   Review of Systems Review of Systems  Constitutional: Negative for chills and fever.  Respiratory: Negative for shortness of breath.   Cardiovascular: Negative for chest pain.  Gastrointestinal: Positive for abdominal pain. Negative for constipation, diarrhea and vomiting.  Genitourinary: Negative for dysuria, hematuria, urgency, vaginal bleeding, vaginal discharge and vaginal pain.  Musculoskeletal: Negative for back pain.  Skin: Negative for rash.  Allergic/Immunologic: Negative for immunocompromised state.  Neurological: Negative for weakness, numbness and headaches.    Physical Exam Updated Vital Signs BP 107/67   Pulse (!) 58   Temp 98.4 F (36.9 C) (Oral)   Resp 16   Wt 78.9 kg (174 lb)   LMP 09/27/2016 Comment: not sure of the date but stated about 5 months ago  SpO2 99%   BMI 26.46 kg/m   Physical Exam  Constitutional: No distress.  HENT:  Head: Normocephalic.  Eyes: Conjunctivae are normal. No scleral icterus.  Neck: Normal range of motion. Neck supple.  Cardiovascular: Normal rate, regular rhythm, normal heart sounds and intact distal pulses. Exam reveals no gallop and no friction rub.  No murmur heard. Pulmonary/Chest: Effort normal and breath sounds normal. No stridor. No respiratory distress. She has no wheezes. She has no rales. She exhibits no tenderness.  Abdominal: Soft. Bowel sounds are normal. She exhibits no distension and no mass. There is tenderness. There is guarding. There is no rebound. No hernia.  No CVA tenderness bilaterally. Writhing around on the bed. Abdomen is significantly TTP RUQ and epigastric area, and LLQ pain. Questionable Murphy's sign.  No tenderness over McBurney's point.  No peritoneal signs.   Musculoskeletal: She exhibits no edema, tenderness or deformity.  Neurological: She is alert.  Skin: Skin is warm. No rash noted.  Psychiatric: Her behavior is normal.  Nursing note  and vitals reviewed.  ED Treatments / Results  Labs (all labs ordered are listed, but only abnormal results are displayed) Labs Reviewed  COMPREHENSIVE METABOLIC PANEL - Abnormal; Notable for the following components:      Result Value   Sodium 133 (*)    CO2 21 (*)    All other components within normal limits  URINALYSIS, ROUTINE W REFLEX MICROSCOPIC - Abnormal; Notable for the following components:   Hgb urine dipstick SMALL (*)    Leukocytes, UA TRACE (*)    Bacteria, UA RARE (*)    Squamous Epithelial / LPF 0-5 (*)    All other  components within normal limits  CBC  LIPASE, BLOOD  I-STAT BETA HCG BLOOD, ED ([facility], [facility], [facility] ONLY)    EKG  EKG Interpretation None       Radiology Ct Abdomen Pelvis W Contrast  Result Date: 02/19/2017 CLINICAL DATA:  Abdominal pain, right lower quadrant, since this morning. EXAM: CT ABDOMEN AND PELVIS WITH CONTRAST TECHNIQUE: Multidetector CT imaging of the abdomen and pelvis was performed using the standard protocol following bolus administration of intravenous contrast. CONTRAST:  11mL ISOVUE-300 IOPAMIDOL (ISOVUE-300) INJECTION 61% COMPARISON:  None. FINDINGS: Lower chest:  Calcified granuloma in the left lower lobe. Hepatobiliary: No focal liver abnormality.Distended gallbladder, recently evaluated by sonography. No acute/ inflammatory changes. Reported gallstone is not visible by CT. Pancreas: Unremarkable. Spleen: Unremarkable. Adrenals/Urinary Tract: Negative adrenals. No hydronephrosis or stone. Unremarkable bladder. Stomach/Bowel: There is a short segment sigmoid intussusception, likely 3-4 cm. Colon is thickened at this level which could be from an underlying lesion or the telescoping with luminal contents. No associated obstruction. There are few loops of distended small bowel without transition point. No appendicitis. Vascular/Lymphatic: No acute vascular abnormality. No mass or adenopathy. Reproductive:Normal. Other: No ascites or  pneumoperitoneum. Musculoskeletal: Negative. IMPRESSION: 1. Short-segment sigmoid intussusception without associated obstruction. The colon is thickened at this level which could be from telescoping and bowel contents or underlying lesion. Recommend GI referral for colonoscopy to evaluate for mass. 2. No appendicitis or other finding in the symptomatic right lower quadrant. Electronically Signed   By: Monte Fantasia M.D.   On: 02/19/2017 09:47   US Abdomen Limited Ruq  Result Date: 02/19/2017 CLINICAL DATA:  Acute right upper quadrant pain. EXAM: ULTRASOUND ABDOMEN LIMITED RIGHT UPPER QUADRANT COMPARISON:  None. FINDINGS: Gallbladder: Small layering gallstones measuring less than 1 cm each. No wall thickening. No sonographic Murphy sign noted by sonographer. Common bile duct: Diameter: 6 mm Liver: No focal lesion identified. Within normal limits in parenchymal echogenicity. Portal vein is patent on color Doppler imaging with normal direction of blood flow towards the liver. IMPRESSION: Cholelithiasis without evidence of acute cholecystitis. Electronically Signed   By: Logan Bores M.D.   On: 02/19/2017 08:51    Procedures Procedures (including critical care time)  Medications Ordered in ED Medications  morphine 4 MG/ML injection 4 mg (4 mg Intravenous Given 02/19/17 0708)  LORazepam (ATIVAN) injection 0.5 mg (0.5 mg Intravenous Given 02/19/17 0753)  fentaNYL (SUBLIMAZE) injection 50 mcg (50 mcg Intravenous Given 02/19/17 0754)  iopamidol (ISOVUE-300) 61 % injection (100 mLs Intravenous Contrast Given 02/19/17 0924)     Initial Impression / Assessment and Plan / ED Course  I have reviewed the triage vital signs and the nursing notes.  Pertinent labs & imaging results that were available during my care of the patient were reviewed by me and considered in my medical decision making (see chart for details).     36 year old female presenting with right upper quadrant pain that began  approximately 1 hour PTA.  No other associated symptoms.  Afebrile.  Normotensive with no tachycardia.  She was given morphine for pain without improvement.  Pain controlled with fentanyl and Ativan.  On reexamination of the abdomen, she is tender in the right upper quadrant and epigastric area.  The rest of her abdominal exam is benign.  She appears nontoxic.  Right upper quadrant ultrasound demonstrating cholelithiasis; no cholecystitis.  CT with possible sigmoid intussusception versus underlying mass.  Spoke with Dr. Marcello Moores with general surgery who recommended consulting GI. Dr. Alessandra Bevels with GI who  evaluated the patient and is able to work her in for a flexible sigmoidoscopy this afternoon and recommends admission for observation pending sigmoidoscopy results. Consulted the hospitalist team and spoke with Dr. Shanon Brow who will admit the patient for continued workup and evaluation. The patient appears reasonably stabilized for admission considering the current resources, flow, and capabilities available in the ED at this time, and I doubt any other [facility] requiring further screening and/or treatment in the ED prior to admission.  Final Clinical Impressions(s) / ED Diagnoses   Final diagnoses:  RUQ pain    ED Discharge Orders    None       Joanne Gavel, PA-C 02/19/17 1129    Gareth Morgan, MD 02/22/17 1317

## 2017-02-19 NOTE — ED Notes (Signed)
Ultrasound at bedside

## 2017-02-19 NOTE — Op Note (Signed)
[facility] Patient Name: Katie Reed Procedure Date: 02/19/2017 MRN: [id number] Attending MD: Otis Brace , MD Date of Birth: 1980-06-06 CSN: [id number] Age: 36 Admit Type: Inpatient Procedure:                Flexible Sigmoidoscopy Indications:              Abdominal pain, Abnormal CT of the GI tract Providers:                Otis Brace, MD, Laverta Baltimore RN, RN,                            Alan Mulder, Technician Referring MD:              Medicines:                Sedation Administered by an Anesthesia Professional Complications:            No immediate complications. Estimated Blood Loss:     Estimated blood loss was minimal. Procedure:                Pre-Anesthesia Assessment:                           - Prior to the procedure, a History and Physical                            was performed, and patient medications and                            allergies were reviewed. The patient's tolerance of                            previous anesthesia was also reviewed. The risks                            and benefits of the procedure and the sedation                            options and risks were discussed with the patient.                            All questions were answered, and informed consent                            was obtained. Prior Anticoagulants: The patient has                            taken no previous anticoagulant or antiplatelet                            agents. ASA Grade Assessment: II - A patient with                            mild systemic disease. After reviewing the risks  and benefits, the patient was deemed in                            satisfactory condition to undergo the procedure.                           After obtaining informed consent, the scope was                            passed under direct vision. The Endoscope was                            introduced through the anus and  advanced to the 50                            cm from the anal verge. The flexible sigmoidoscopy                            was accomplished without difficulty. The patient                            tolerated the procedure well. The quality of the                            bowel preparation was poor. Scope In: Scope Out: Findings:      The perianal examination was normal.      The digital rectal exam findings include showed possible small lesion in       the distal rectum.      A frond-like/villous non-obstructing large mass was found in the sigmoid       colon at 40 to 45 cm proximal to the anus. Small oozing of blood. This       was biopsied with a cold forceps for histology. Area was tattooed with       an injection of Spot (carbon black).      A medium-sized polypoid lesion was found in the rectum. The lesion was       polypoid. No bleeding was present. This was biopsied with a cold forceps       for histology.      No evidence of intussusception found. Colon mucosa proximal to colon       mass appeared normal.      Internal hemorrhoids were found during retroflexion. The hemorrhoids       were small. Impression:               - Preparation of the colon was poor.                           - Showed possible small lesion in the distal rectum                            found on digital rectal exam.                           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 Biopsied. Tattooed.                           - Polypoid lesion in the rectum. Biopsied.                           - Internal hemorrhoids. Moderate Sedation:      Moderate (conscious) sedation was personally administered by an       anesthesia professional. The following parameters were monitored: oxygen       saturation, heart rate, blood pressure, and response to care. Recommendation:           - Admit the patient to hospital ward for ongoing                             care.                           - Clear liquid diet.                           - Await pathology results.                           - Refer to a surgeon at appointment to be scheduled. Procedure Code(s):        --- Professional ---                           [phone], Sigmoidoscopy, flexible; with biopsy, single                            or multiple                           [id number], Sigmoidoscopy, flexible; with directed                            submucosal injection(s), any substance Diagnosis Code(s):        --- Professional ---                           K64.8, Other hemorrhoids                           D49.0, Neoplasm of unspecified behavior of                            digestive system                           R10.9, Unspecified abdominal pain                           R93.3, Abnormal findings on diagnostic imaging of                            other parts of digestive tract CPT copyright 2016 [employer]. All rights  reserved. The codes documented in this report are preliminary and upon coder review may  be revised to meet current compliance requirements. Otis Brace, MD Otis Brace, MD 02/19/2017 1:03:56 PM Number of Addenda: 0

## 2017-02-19 NOTE — Anesthesia Postprocedure Evaluation (Signed)
Anesthesia Post Note  Patient: Katie Reed  Procedure(s) Performed: FLEXIBLE SIGMOIDOSCOPY (N/A )     Patient location during evaluation: PACU Anesthesia Type: MAC Level of consciousness: awake and alert Pain management: pain level controlled Vital Signs Assessment: post-procedure vital signs reviewed and stable Respiratory status: spontaneous breathing, nonlabored ventilation and respiratory function stable Cardiovascular status: stable and blood pressure returned to baseline Anesthetic complications: no    Last Vitals:  Vitals:   02/19/17 1305 02/19/17 1310  BP: 129/72 (!) 136/58  Pulse: 64 81  Resp: (!) 24 (!) 22  Temp: 36.6 C   SpO2: 100% 100%    Last Pain:  Vitals:   02/19/17 1305  TempSrc: Oral  PainSc:                  Audry Pili

## 2017-02-19 NOTE — H&P (Signed)
History and Physical    Katie Reed RFF:[id number] DOB: [birth year] DOA: 02/19/2017  PCP: Default, Provider, MD  Patient coming from:  home  Chief Complaint:  Abdominal pain  HPI: Katie Reed is a 36 y.o. female with medical history significant of depression comes in with acute onset of right sided abdominal pain that awoke her from sleep that was severe.  Sudden onset.  Not relieved by anything.  No n/v/d.  No fevers.  No recent illnesses.  No bleeding issues.  Pt found to have intussception of sigmoid.  Went to OR today for flex sigmoidoscopy and a small polyp was found it was biopsed.  Pt referred for admission overnight for pain control.  Review of Systems: As per HPI otherwise 10 point review of systems negative.   Past Medical History:  Diagnosis Date  . Anxiety   . Depression   . Preterm labor     Past Surgical History:  Procedure Laterality Date  . NO PAST SURGERIES    . THERAPEUTIC ABORTION     elective     reports that she has been smoking cigarettes.  She has a 3.50 pack-year smoking history. she has never used smokeless tobacco. She reports that she drinks about 0.6 oz of alcohol per week. She reports that she does not use drugs.  No Known Allergies  Family History  Problem Relation Age of Onset  . Hypertension Mother   . Alcohol abuse Neg Hx   . Arthritis Neg Hx   . Asthma Neg Hx   . Birth defects Neg Hx   . Cancer Neg Hx   . COPD Neg Hx   . Depression Neg Hx   . Diabetes Neg Hx   . Drug abuse Neg Hx   . Early death Neg Hx   . Hearing loss Neg Hx   . Heart disease Neg Hx   . Hyperlipidemia Neg Hx   . Kidney disease Neg Hx   . Learning disabilities Neg Hx   . Mental illness Neg Hx   . Mental retardation Neg Hx   . Miscarriages / Stillbirths Neg Hx   . Stroke Neg Hx   . Vision loss Neg Hx   . Varicose Veins Neg Hx     Prior to Admission medications   Medication Sig Start Date End Date Taking? Authorizing Provider  acetaminophen  (TYLENOL) 500 MG tablet Take 500 mg by mouth every 6 (six) hours as needed for mild pain.   Yes [provider]  cyclobenzaprine (FLEXERIL) 5 MG tablet Take 1 tablet (5 mg total) by mouth 3 (three) times daily as needed for muscle spasms. Patient not taking: Reported on 02/19/2017 12/24/15   Quintella Reichert, MD  ibuprofen (ADVIL,MOTRIN) 600 MG tablet Take 1 tablet (600 mg total) by mouth every 6 (six) hours as needed. Patient not taking: Reported on 02/19/2017 04/03/15   Myrtis Ser, CNM  medroxyPROGESTERone (DEPO-PROVERA) 150 MG/ML injection Inject 1 mL (150 mg total) into the muscle every 3 (three) months. Patient not taking: Reported on 12/24/2015 05/17/15   Lavonia Drafts, MD    Physical Exam: Vitals:   02/19/17 0630 02/19/17 0800 02/19/17 0854 02/19/17 0900  BP:  117/77 114/67 107/67  Pulse:  60 60 (!) 58  Resp:   16   Temp:      TempSrc:      SpO2:   99%   Weight: 78.9 kg (174 lb)         Constitutional: NAD, calm, comfortable  Vitals:   02/19/17 0630 02/19/17 0800 02/19/17 0854 02/19/17 0900  BP:  117/77 114/67 107/67  Pulse:  60 60 (!) 58  Resp:   16   Temp:      TempSrc:      SpO2:   99%   Weight: 78.9 kg (174 lb)      Eyes: PERRL, lids and conjunctivae normal ENMT: Mucous membranes are moist. Posterior pharynx clear of any exudate or lesions.Normal dentition.  Neck: normal, supple, no masses, no thyromegaly Respiratory: clear to auscultation bilaterally, no wheezing, no crackles. Normal respiratory effort. No accessory muscle use.  Cardiovascular: Regular rate and rhythm, no murmurs / rubs / gallops. No extremity edema. 2+ pedal pulses. No carotid bruits.  Abdomen: no tenderness, no masses palpated. No hepatosplenomegaly. Bowel sounds positive.  Musculoskeletal: no clubbing / cyanosis. No joint deformity upper and lower extremities. Good ROM, no contractures. Normal muscle tone.  Skin: no rashes, lesions, ulcers. No induration Neurologic: CN 2-12  grossly intact. Sensation intact, DTR normal. Strength 5/5 in all 4.  Psychiatric: Normal judgment and insight. Alert and oriented x 3. upset  Labs on Admission: I have personally reviewed following labs and imaging studies  CBC: Recent Labs  Lab 02/19/17 0637  WBC 9.5  HGB 12.5  HCT 37.3  MCV 90.8  PLT 916   Basic Metabolic Panel: Recent Labs  Lab 02/19/17 0637  NA 133*  K 3.6  CL 104  CO2 21*  GLUCOSE 98  BUN 20  CREATININE 0.89  CALCIUM 9.4   GFR: Estimated Creatinine Clearance: 95.5 mL/min (by C-G formula based on SCr of 0.89 mg/dL). Liver Function Tests: Recent Labs  Lab 02/19/17 0637  AST 31  ALT 38  ALKPHOS 42  BILITOT 0.5  PROT 7.4  ALBUMIN 4.1   Recent Labs  Lab 02/19/17 0637  LIPASE 34   No results for input(s): AMMONIA in the last 168 hours. Coagulation Profile: No results for input(s): INR, PROTIME in the last 168 hours. Cardiac Enzymes: No results for input(s): CKTOTAL, CKMB, CKMBINDEX, TROPONINI in the last 168 hours. BNP (last 3 results) No results for input(s): PROBNP in the last 8760 hours. HbA1C: No results for input(s): HGBA1C in the last 72 hours. CBG: No results for input(s): GLUCAP in the last 168 hours. Lipid Profile: No results for input(s): CHOL, HDL, LDLCALC, TRIG, CHOLHDL, LDLDIRECT in the last 72 hours. Thyroid Function Tests: No results for input(s): TSH, T4TOTAL, FREET4, T3FREE, THYROIDAB in the last 72 hours. Anemia Panel: No results for input(s): VITAMINB12, FOLATE, FERRITIN, TIBC, IRON, RETICCTPCT in the last 72 hours. Urine analysis:    Component Value Date/Time   COLORURINE YELLOW 02/19/2017 0638   APPEARANCEUR CLEAR 02/19/2017 0638   LABSPEC 1.018 02/19/2017 0638   PHURINE 7.0 02/19/2017 0638   GLUCOSEU NEGATIVE 02/19/2017 0638   HGBUR SMALL (A) 02/19/2017 0638   BILIRUBINUR NEGATIVE 02/19/2017 0638   KETONESUR NEGATIVE 02/19/2017 0638   PROTEINUR NEGATIVE 02/19/2017 0638   UROBILINOGEN 1.0 03/22/2015 0851     NITRITE NEGATIVE 02/19/2017 0638   LEUKOCYTESUR TRACE (A) 02/19/2017 0638   Sepsis Labs: !!!!!!!!!!!!!!!!!!!!!!!!!!!!!!!!!!!!!!!!!!!! @LABRCNTIP (procalcitonin:4,lacticidven:4) )No results found for this or any previous visit (from the past 240 hour(s)).   Radiological Exams on Admission: Ct Abdomen Pelvis W Contrast  Result Date: 02/19/2017 CLINICAL DATA:  Abdominal pain, right lower quadrant, since this morning. EXAM: CT ABDOMEN AND PELVIS WITH CONTRAST TECHNIQUE: Multidetector CT imaging of the abdomen and pelvis was performed using the standard protocol following bolus administration of  intravenous contrast. CONTRAST:  161mL ISOVUE-300 IOPAMIDOL (ISOVUE-300) INJECTION 61% COMPARISON:  None. FINDINGS: Lower chest:  Calcified granuloma in the left lower lobe. Hepatobiliary: No focal liver abnormality.Distended gallbladder, recently evaluated by sonography. No acute/ inflammatory changes. Reported gallstone is not visible by CT. Pancreas: Unremarkable. Spleen: Unremarkable. Adrenals/Urinary Tract: Negative adrenals. No hydronephrosis or stone. Unremarkable bladder. Stomach/Bowel: There is a short segment sigmoid intussusception, likely 3-4 cm. Colon is thickened at this level which could be from an underlying lesion or the telescoping with luminal contents. No associated obstruction. There are few loops of distended small bowel without transition point. No appendicitis. Vascular/Lymphatic: No acute vascular abnormality. No mass or adenopathy. Reproductive:Normal. Other: No ascites or pneumoperitoneum. Musculoskeletal: Negative. IMPRESSION: 1. Short-segment sigmoid intussusception without associated obstruction. The colon is thickened at this level which could be from telescoping and bowel contents or underlying lesion. Recommend GI referral for colonoscopy to evaluate for mass. 2. No appendicitis or other finding in the symptomatic right lower quadrant. Electronically Signed   By: Monte Fantasia M.D.    On: 02/19/2017 09:47   US Abdomen Limited Ruq  Result Date: 02/19/2017 CLINICAL DATA:  Acute right upper quadrant pain. EXAM: ULTRASOUND ABDOMEN LIMITED RIGHT UPPER QUADRANT COMPARISON:  None. FINDINGS: Gallbladder: Small layering gallstones measuring less than 1 cm each. No wall thickening. No sonographic Murphy sign noted by sonographer. Common bile duct: Diameter: 6 mm Liver: No focal lesion identified. Within normal limits in parenchymal echogenicity. Portal vein is patent on color Doppler imaging with normal direction of blood flow towards the liver. IMPRESSION: Cholelithiasis without evidence of acute cholecystitis. Electronically Signed   By: Logan Bores M.D.   On: 02/19/2017 08:51    Assessment/Plan 36 yo female with intussusception with mass  Principal Problem:   Intussusception intestine ([place]) due to mass - biopsy pending.  General surgery called who recommended outpt follow up per GI.  Appreciate GI help today.  SW consult as pt has no health insurance.  Active Problems:   Depression- noted   Anxiety- noted     DVT prophylaxis:  scds Code Status:  full Family Communication:  Husband present Disposition Plan:  Per day team Consults called:  GI Admission status:  observation    Keren Alverio A MD [facility]  If 7PM-7AM, please contact night-coverage [email or web address] Password [email or web address]  02/19/2017, 11:16 AM

## 2017-02-19 NOTE — ED Notes (Signed)
Bed: [facility] Expected date:  Expected time:  Means of arrival:  Comments: EMS abd pain 

## 2017-02-19 NOTE — ED Notes (Addendum)
Called Floor to give report, Webb Silversmith RN unavailable to take at this time. Awaiting a call back.

## 2017-02-19 NOTE — Brief Op Note (Signed)
02/19/2017  1:06 PM  PATIENT:  Wardell Honour  36 y.o. female  PRE-OPERATIVE DIAGNOSIS:  Sigmoid intussusception  POST-OPERATIVE DIAGNOSIS:  Sigmoid colon  Mass, biopsies to rule out malignancy, spot placed, Abnormal mucosa rectum, bx taken  PROCEDURE:  Procedure(s): FLEXIBLE SIGMOIDOSCOPY (N/A)  SURGEON:  Surgeon(s) and Role:    * Juana Haralson, MD - Primary   Findings/recommendations ------------------------------------- - Colonoscopy showed friable mass at 40-45 cm. No evidence of intussusception. No evidence of colon obstruction at this site. Biopsies were taken. Tattooing was performed. - Colonoscopy also showed around 1 cm polypoid lesion in the rectum just above the dentate line. Biopsies were taken   Recommendations ---------------------------- - Admit to hospital for observation - I have discussed with surgery attending Dr. Henri Medal. Because patient has no evidence of bowel obstruction, see has recommended outpatient follow-up for surgical intervention. - Discussed with the admitting team. - Clear liquid diet, advance as tolerated. - Hopefully discharge home tomorrow if abdominal pain continues to improve. - GI will follow  Otis Brace MD, [place] 02/19/2017, 1:16 PM  Contact #  [phone]

## 2017-02-19 NOTE — ED Notes (Signed)
Bed: IT19 Expected date:  Expected time:  Means of arrival:  Comments: PT in ENDO

## 2017-02-19 NOTE — Transfer of Care (Signed)
Immediate Anesthesia Transfer of Care Note  Patient: Katie Reed  Procedure(s) Performed: FLEXIBLE SIGMOIDOSCOPY (N/A )  Patient Location: PACU and Endoscopy Unit  Anesthesia Type:MAC  Level of Consciousness: awake, oriented, drowsy and patient cooperative  Airway & Oxygen Therapy: Patient Spontanous Breathing and Patient connected to face mask oxygen  Post-op Assessment: Report given to RN, Post -op Vital signs reviewed and stable and Patient moving all extremities  Post vital signs: Reviewed and stable  Last Vitals:  Vitals:   02/19/17 0900 02/19/17 1137  BP: 107/67 127/71  Pulse: (!) 58 (!) 54  Resp:  12  Temp:  37.1 C  SpO2:  98%    Last Pain:  Vitals:   02/19/17 1137  TempSrc: Oral  PainSc:          Complications: No apparent anesthesia complications

## 2017-02-19 NOTE — Anesthesia Preprocedure Evaluation (Addendum)
Anesthesia Evaluation  Patient identified by MRN, date of birth, ID band Patient awake    Reviewed: Allergy & Precautions, H&P , NPO status , Patient's Chart, lab work & pertinent test results  Airway Mallampati: II  TM Distance: >3 FB Neck ROM: full    Dental no notable dental hx. (+) Teeth Intact   Pulmonary Current Smoker,    Pulmonary exam normal breath sounds clear to auscultation       Cardiovascular Exercise Tolerance: Good negative cardio ROS   Rhythm:Regular Rate:Normal     Neuro/Psych Anxiety Depression negative neurological ROS     GI/Hepatic negative GI ROS, Neg liver ROS, Sigmoid intussusception   Endo/Other  negative endocrine ROS  Renal/GU negative Renal ROS  negative genitourinary   Musculoskeletal negative musculoskeletal ROS (+)   Abdominal   Peds  Hematology negative hematology ROS (+)   Anesthesia Other Findings   Reproductive/Obstetrics                            Anesthesia Physical  Anesthesia Plan  ASA: II and emergent  Anesthesia Plan: MAC   Post-op Pain Management:    Induction: Intravenous  PONV Risk Score and Plan:   Airway Management Planned: Simple Face Mask  Additional Equipment: None  Intra-op Plan:   Post-operative Plan:   Informed Consent: I have reviewed the patients History and Physical, chart, labs and discussed the procedure including the risks, benefits and alternatives for the proposed anesthesia with the patient or authorized representative who has indicated his/her understanding and acceptance.     Plan Discussed with: CRNA  Anesthesia Plan Comments:        Anesthesia Quick Evaluation

## 2017-02-19 NOTE — ED Triage Notes (Signed)
Per EMS , pt. From home with complaint of sharp abdominal pain R/L upper quadrants which started at 5am this morning . Pt. Denied N/V, denied diarrhea. Pt. Reported to EMs been off depo4-5 months ago and currently not using birth control . Pt. Also stated that she felts nausea and ending up vomiting from smells and sometimes after she eats. Admits to drinking beer every other night , last drink last night. Received 50 mcg IV fentanyl via EMS , claimed relief upon arrival to ED.

## 2017-02-19 NOTE — Consult Note (Signed)
Referring Provider:  EDP  Primary Care Physician:  Default, Provider, MD Primary Gastroenterologist:  Althia Forts  Reason for Consultation:  Abdominal pain, sigmoid intussusception  HPI: Katie Reed is a 36 y.o. female  admitted to the hospital with worsening abdominal pain. CT scan showed short segment sigmoid intussusception without associated obstruction but the thickening of the colon wall at this level. Radiology recommended GI referral for colonoscopy to evaluate underlying mass.   Patient seen and examined in the ED. Family at bedside. According to patient, she woke up with the epigastric abdominal pain which continued to got worse and was associated with worsening abdominal distention. She denied any nausea or vomiting. Denied any diarrhea. Last bowel movement was day before yesterday. Denied any blood in the stool or black stool. Denied any weight loss or change in appetite. No similar symptoms in the past.  No family history of colon cancer. No previous EGD or colonoscopy  Past Medical History:  Diagnosis Date  . Anxiety   . Depression   . Preterm labor     Past Surgical History:  Procedure Laterality Date  . NO PAST SURGERIES    . THERAPEUTIC ABORTION     elective    Prior to Admission medications   Medication Sig Start Date End Date Taking? Authorizing Provider  acetaminophen (TYLENOL) 500 MG tablet Take 500 mg by mouth every 6 (six) hours as needed for mild pain.   Yes [provider]  cyclobenzaprine (FLEXERIL) 5 MG tablet Take 1 tablet (5 mg total) by mouth 3 (three) times daily as needed for muscle spasms. Patient not taking: Reported on 02/19/2017 12/24/15   Quintella Reichert, MD  ibuprofen (ADVIL,MOTRIN) 600 MG tablet Take 1 tablet (600 mg total) by mouth every 6 (six) hours as needed. Patient not taking: Reported on 02/19/2017 04/03/15   Myrtis Ser, CNM  medroxyPROGESTERone (DEPO-PROVERA) 150 MG/ML injection Inject 1 mL (150 mg total) into the  muscle every 3 (three) months. Patient not taking: Reported on 12/24/2015 05/17/15   Lavonia Drafts, MD    Scheduled Meds: Continuous Infusions: PRN Meds:.  Allergies as of 02/19/2017  . (No Known Allergies)    Family History  Problem Relation Age of Onset  . Hypertension Mother   . Alcohol abuse Neg Hx   . Arthritis Neg Hx   . Asthma Neg Hx   . Birth defects Neg Hx   . Cancer Neg Hx   . COPD Neg Hx   . Depression Neg Hx   . Diabetes Neg Hx   . Drug abuse Neg Hx   . Early death Neg Hx   . Hearing loss Neg Hx   . Heart disease Neg Hx   . Hyperlipidemia Neg Hx   . Kidney disease Neg Hx   . Learning disabilities Neg Hx   . Mental illness Neg Hx   . Mental retardation Neg Hx   . Miscarriages / Stillbirths Neg Hx   . Stroke Neg Hx   . Vision loss Neg Hx   . Varicose Veins Neg Hx     Social History   Socioeconomic History  . Marital status: Married    Spouse name: Not on file  . Number of children: Not on file  . Years of education: Not on file  . Highest education level: Not on file  Social Needs  . Financial resource strain: Not on file  . Food insecurity - worry: Not on file  . Food insecurity - inability: Not on file  .  Transportation needs - medical: Not on file  . Transportation needs - non-medical: Not on file  Occupational History  . Not on file  Tobacco Use  . Smoking status: Current Some Day Smoker    Packs/day: 0.25    Years: 14.00    Pack years: 3.50    Types: Cigarettes  . Smokeless tobacco: Never Used  Substance and Sexual Activity  . Alcohol use: Yes    Alcohol/week: 0.6 oz    Types: 1 Glasses of wine per week    Comment: 1-2 times per week  . Drug use: No  . Sexual activity: Yes    Birth control/protection: None  Other Topics Concern  . Not on file  Social History Narrative  . Not on file    Review of Systems: Review of Systems  Constitutional: Negative for chills, fever and weight loss.  HENT: Negative for hearing loss  and tinnitus.   Eyes: Negative for blurred vision and double vision.  Respiratory: Negative for cough, hemoptysis and sputum production.   Cardiovascular: Negative for chest pain and palpitations.  Gastrointestinal: Positive for abdominal pain. Negative for blood in stool, diarrhea, heartburn, melena, nausea and vomiting.  Genitourinary: Negative for dysuria and urgency.  Musculoskeletal: Negative for myalgias and neck pain.  Skin: Negative for itching and rash.  Neurological: Negative for focal weakness and seizures.  Endo/Heme/Allergies: Does not bruise/bleed easily.  Psychiatric/Behavioral: Negative for hallucinations and suicidal ideas.    Physical Exam: Vital signs: Vitals:   02/19/17 0854 02/19/17 0900  BP: 114/67 107/67  Pulse: 60 (!) 58  Resp: 16   Temp:    SpO2: 99%      Physical Exam  Constitutional: She is oriented to person, place, and time. She appears well-developed and well-nourished. No distress.  HENT:  Head: Normocephalic and atraumatic.  Mouth/Throat: Oropharynx is clear and moist. No oropharyngeal exudate.  Eyes: EOM are normal. No scleral icterus.  Neck: Normal range of motion. Neck supple. No thyromegaly present.  Cardiovascular: Normal rate, regular rhythm and normal heart sounds.  Pulmonary/Chest: Effort normal and breath sounds normal. No respiratory distress.  Abdominal: Soft. Bowel sounds are normal. She exhibits distension. There is tenderness. There is no rebound and no guarding.  Epigastric tenderness to palpation with mild distention. No peritoneal signs  Musculoskeletal: Normal range of motion. She exhibits no edema.  Neurological: She is alert and oriented to person, place, and time.  Skin: Skin is dry. No erythema.  Psychiatric: She has a normal mood and affect. Judgment and thought content normal.  Vitals reviewed.   GI:  Lab Results: Recent Labs    02/19/17 0637  WBC 9.5  HGB 12.5  HCT 37.3  PLT 322   BMET Recent Labs     02/19/17 0637  NA 133*  K 3.6  CL 104  CO2 21*  GLUCOSE 98  BUN 20  CREATININE 0.89  CALCIUM 9.4   LFT Recent Labs    02/19/17 0637  PROT 7.4  ALBUMIN 4.1  AST 31  ALT 38  ALKPHOS 42  BILITOT 0.5   PT/INR No results for input(s): LABPROT, INR in the last 72 hours.   Studies/Results: Ct Abdomen Pelvis W Contrast  Result Date: 02/19/2017 CLINICAL DATA:  Abdominal pain, right lower quadrant, since this morning. EXAM: CT ABDOMEN AND PELVIS WITH CONTRAST TECHNIQUE: Multidetector CT imaging of the abdomen and pelvis was performed using the standard protocol following bolus administration of intravenous contrast. CONTRAST:  191mL ISOVUE-300 IOPAMIDOL (ISOVUE-300) INJECTION 61% COMPARISON:  None. FINDINGS: Lower chest:  Calcified granuloma in the left lower lobe. Hepatobiliary: No focal liver abnormality.Distended gallbladder, recently evaluated by sonography. No acute/ inflammatory changes. Reported gallstone is not visible by CT. Pancreas: Unremarkable. Spleen: Unremarkable. Adrenals/Urinary Tract: Negative adrenals. No hydronephrosis or stone. Unremarkable bladder. Stomach/Bowel: There is a short segment sigmoid intussusception, likely 3-4 cm. Colon is thickened at this level which could be from an underlying lesion or the telescoping with luminal contents. No associated obstruction. There are few loops of distended small bowel without transition point. No appendicitis. Vascular/Lymphatic: No acute vascular abnormality. No mass or adenopathy. Reproductive:Normal. Other: No ascites or pneumoperitoneum. Musculoskeletal: Negative. IMPRESSION: 1. Short-segment sigmoid intussusception without associated obstruction. The colon is thickened at this level which could be from telescoping and bowel contents or underlying lesion. Recommend GI referral for colonoscopy to evaluate for mass. 2. No appendicitis or other finding in the symptomatic right lower quadrant. Electronically Signed   By: Monte Fantasia M.D.   On: 02/19/2017 09:47   US Abdomen Limited Ruq  Result Date: 02/19/2017 CLINICAL DATA:  Acute right upper quadrant pain. EXAM: ULTRASOUND ABDOMEN LIMITED RIGHT UPPER QUADRANT COMPARISON:  None. FINDINGS: Gallbladder: Small layering gallstones measuring less than 1 cm each. No wall thickening. No sonographic Murphy sign noted by sonographer. Common bile duct: Diameter: 6 mm Liver: No focal lesion identified. Within normal limits in parenchymal echogenicity. Portal vein is patent on color Doppler imaging with normal direction of blood flow towards the liver. IMPRESSION: Cholelithiasis without evidence of acute cholecystitis. Electronically Signed   By: Logan Bores M.D.   On: 02/19/2017 08:51    Impression/Plan: - Epigastric abdominal pain with CT scan showing short segment sigmoid intussusception without associated obstruction but thickening of the colon at this level. Colonoscopy is recommended by radiology to evaluate for underlying mass.  Recommendations ------------------------ - D/W ED provider Mia McDonald,PA-C. She has discussed with surgeon and they have also recommended GI referral for further management. - Plan for flexible sigmoidoscopy today. Patient is at high-risk for perforation which was discussed with the patient as well as family. They verbalized understanding. - She may need overnight observation which was also discussed with the ED provider. - She may need hydrostatic or pneumatic(preferred)  reduction of introsusception depending on flexible sigmoidoscopy finding.  Risks (bleeding, infection, increased risk of bowel perforation that could require surgery, sedation-related changes in cardiopulmonary systems), benefits (identification and possible treatment of source of symptoms, exclusion of certain causes of symptoms), and alternatives (watchful waiting, radiographic imaging studies, empiric medical treatment)  were explained to patient and family in detail and  patient wishes to proceed.    LOS: 0 days   Otis Brace  MD, FACP 02/19/2017, 10:38 AM  Contact #  [phone]

## 2017-02-20 ENCOUNTER — Encounter (HOSPITAL_COMMUNITY): Payer: Self-pay | Admitting: Gastroenterology

## 2017-02-20 DIAGNOSIS — R109 Unspecified abdominal pain: Secondary | ICD-10-CM

## 2017-02-20 NOTE — Discharge Summary (Addendum)
Physician Discharge Summary  Katie Reed UVO:[id number] DOB: 03/10/1980 DOA: 02/19/2017  PCP: Default, Provider, MD  Admit date: 02/19/2017 Discharge date: 02/20/2017  Recommendations for Outpatient Follow-up:  1. Follow up with GI and surgery per sch appt   Discharge Diagnoses:  Principal Problem:   Intussusception intestine ([place]) Active Problems:   Depression   Anxiety    Discharge Condition: stable   Diet recommendation: as tolerated   History of present illness:   Per HPI "36 y.o. female with medical history significant of depression comes in with acute onset of right sided abdominal pain that awoke her from sleep that was severe.  Sudden onset.  Not relieved by anything.  No n/v/d.  No fevers.  No recent illnesses.  No bleeding issues.  Pt found to have intussception of sigmoid.  Went to OR today for flex sigmoidoscopy and a small polyp was found it was biopsed.  Pt referred for admission overnight for pain control."  Hospital Course:   Principal Problem:   Abdominal pain - Colonoscopy showed friable mass at 40-45 cm. No evidence of intussusception. No evidence of colon obstruction at this site. Biopsies were taken. - Colonoscopy also showed around 1 cm polypoid lesion in the rectum just above the dentate line. Biopsies were taken  - Pt to follow up with GI and surgery per sch appt - Regular diet    Signed:  Leisa Lenz, MD  [facility] 02/20/2017, 10:22 AM  Pager #: [phone]  Time spent in minutes: more than 30 minutes  Procedures:  Flex sigmoidoscopy  Consultations:  GI  Surgery  Discharge Exam: Vitals:   02/19/17 2124 02/20/17 0622  BP: 121/69 120/72  Pulse: 81 72  Resp: 17 16  Temp: 99 F (37.2 C) 99 F (37.2 C)  SpO2: 97% 100%   Vitals:   02/19/17 1500 02/19/17 2124 02/20/17 0500 02/20/17 0622  BP: 110/76 121/69  120/72  Pulse: (!) 57 81  72  Resp: 18 17  16   Temp: 98.7 F (37.1 C) 99 F (37.2 C)  99 F (37.2 C)   TempSrc: Oral Oral  Oral  SpO2: 100% 97%  100%  Weight:   91.4 kg (201 lb 8 oz)   Height:        General: Pt is alert, follows commands appropriately, not in acute distress Cardiovascular: Regular rate and rhythm, S1/S2 +, no murmurs Respiratory: Clear to auscultation bilaterally, no wheezing, no crackles, no rhonchi Abdominal: Soft, non tender, non distended, bowel sounds +, no guarding Extremities: no edema, no cyanosis, pulses palpable bilaterally DP and PT Neuro: Grossly nonfocal  Discharge Instructions  Discharge Instructions    Call MD for:  persistant nausea and vomiting   Complete by:  As directed    Call MD for:  redness, tenderness, or signs of infection (pain, swelling, redness, odor or green/yellow discharge around incision site)   Complete by:  As directed    Call MD for:  severe uncontrolled pain   Complete by:  As directed    Diet - low sodium heart healthy   Complete by:  As directed    Discharge instructions   Complete by:  As directed    Follow up with surgery per sch appt   Increase activity slowly   Complete by:  As directed      Allergies as of 02/20/2017   No Known Allergies     Medication List    STOP taking these medications   ibuprofen 600 MG tablet Commonly  known as:  ADVIL,MOTRIN   medroxyPROGESTERone 150 MG/ML injection Commonly known as:  DEPO-PROVERA     TAKE these medications   acetaminophen 500 MG tablet Commonly known as:  TYLENOL Take 500 mg by mouth every 6 (six) hours as needed for mild pain.   cyclobenzaprine 5 MG tablet Commonly known as:  FLEXERIL Take 1 tablet (5 mg total) by mouth 3 (three) times daily as needed for muscle spasms.      Follow-up Information    Leighton Ruff, MD. Schedule an appointment as soon as possible for a visit.   Specialty:  General Surgery Contact information: [place] [address] [phone]            The results of significant diagnostics from this  hospitalization (including imaging, microbiology, ancillary and laboratory) are listed below for reference.    Significant Diagnostic Studies: Ct Abdomen Pelvis W Contrast  Result Date: 02/19/2017 CLINICAL DATA:  Abdominal pain, right lower quadrant, since this morning. EXAM: CT ABDOMEN AND PELVIS WITH CONTRAST TECHNIQUE: Multidetector CT imaging of the abdomen and pelvis was performed using the standard protocol following bolus administration of intravenous contrast. CONTRAST:  153mL ISOVUE-300 IOPAMIDOL (ISOVUE-300) INJECTION 61% COMPARISON:  None. FINDINGS: Lower chest:  Calcified granuloma in the left lower lobe. Hepatobiliary: No focal liver abnormality.Distended gallbladder, recently evaluated by sonography. No acute/ inflammatory changes. Reported gallstone is not visible by CT. Pancreas: Unremarkable. Spleen: Unremarkable. Adrenals/Urinary Tract: Negative adrenals. No hydronephrosis or stone. Unremarkable bladder. Stomach/Bowel: There is a short segment sigmoid intussusception, likely 3-4 cm. Colon is thickened at this level which could be from an underlying lesion or the telescoping with luminal contents. No associated obstruction. There are few loops of distended small bowel without transition point. No appendicitis. Vascular/Lymphatic: No acute vascular abnormality. No mass or adenopathy. Reproductive:Normal. Other: No ascites or pneumoperitoneum. Musculoskeletal: Negative. IMPRESSION: 1. Short-segment sigmoid intussusception without associated obstruction. The colon is thickened at this level which could be from telescoping and bowel contents or underlying lesion. Recommend GI referral for colonoscopy to evaluate for mass. 2. No appendicitis or other finding in the symptomatic right lower quadrant. Electronically Signed   By: Monte Fantasia M.D.   On: 02/19/2017 09:47   US Abdomen Limited Ruq  Result Date: 02/19/2017 CLINICAL DATA:  Acute right upper quadrant pain. EXAM: ULTRASOUND ABDOMEN  LIMITED RIGHT UPPER QUADRANT COMPARISON:  None. FINDINGS: Gallbladder: Small layering gallstones measuring less than 1 cm each. No wall thickening. No sonographic Murphy sign noted by sonographer. Common bile duct: Diameter: 6 mm Liver: No focal lesion identified. Within normal limits in parenchymal echogenicity. Portal vein is patent on color Doppler imaging with normal direction of blood flow towards the liver. IMPRESSION: Cholelithiasis without evidence of acute cholecystitis. Electronically Signed   By: Logan Bores M.D.   On: 02/19/2017 08:51    Microbiology: No results found for this or any previous visit (from the past 240 hour(s)).   Labs: Basic Metabolic Panel: Recent Labs  Lab 02/19/17 0637  NA 133*  K 3.6  CL 104  CO2 21*  GLUCOSE 98  BUN 20  CREATININE 0.89  CALCIUM 9.4   Liver Function Tests: Recent Labs  Lab 02/19/17 0637  AST 31  ALT 38  ALKPHOS 42  BILITOT 0.5  PROT 7.4  ALBUMIN 4.1   Recent Labs  Lab 02/19/17 0637  LIPASE 34   No results for input(s): AMMONIA in the last 168 hours. CBC: Recent Labs  Lab 02/19/17 [phone]  WBC 9.5  HGB 12.5  HCT 37.3  MCV 90.8  PLT 322   Cardiac Enzymes: No results for input(s): CKTOTAL, CKMB, CKMBINDEX, TROPONINI in the last 168 hours. BNP: BNP (last 3 results) No results for input(s): BNP in the last 8760 hours.  ProBNP (last 3 results) No results for input(s): PROBNP in the last 8760 hours.  CBG: No results for input(s): GLUCAP in the last 168 hours.

## 2017-02-20 NOTE — Progress Notes (Signed)
Dr Alessandra Bevels notified me of his findings.  Awaiting biopsy results.  She will need a full colonoscopy as an outpatient prior to any surgical intervention.  She can f/u with me as an outpatient to discuss surgery further.  Rosario Adie, MD  [facility]

## 2017-02-20 NOTE — Discharge Instructions (Signed)

## 2017-04-18 ENCOUNTER — Emergency Department (HOSPITAL_COMMUNITY): Payer: Self-pay

## 2017-04-18 ENCOUNTER — Encounter (HOSPITAL_COMMUNITY): Payer: Self-pay | Admitting: Family Medicine

## 2017-04-18 ENCOUNTER — Emergency Department (HOSPITAL_COMMUNITY)
Admission: EM | Admit: 2017-04-18 | Discharge: 2017-04-18 | Disposition: A | Discharge status: Home / Self Care | Payer: Self-pay | Attending: Emergency Medicine | Admitting: Emergency Medicine

## 2017-04-18 DIAGNOSIS — Z87891 Personal history of nicotine dependence: Secondary | ICD-10-CM | POA: Insufficient documentation

## 2017-04-18 DIAGNOSIS — R197 Diarrhea, unspecified: Secondary | ICD-10-CM | POA: Insufficient documentation

## 2017-04-18 DIAGNOSIS — R1084 Generalized abdominal pain: Secondary | ICD-10-CM | POA: Insufficient documentation

## 2017-04-18 DIAGNOSIS — R112 Nausea with vomiting, unspecified: Secondary | ICD-10-CM | POA: Insufficient documentation

## 2017-04-18 LAB — COMPREHENSIVE METABOLIC PANEL
ALT: 36 U/L (ref 14–54)
ANION GAP: 9 (ref 5–15)
AST: 22 U/L (ref 15–41)
Albumin: 4.1 g/dL (ref 3.5–5.0)
Alkaline Phosphatase: 46 U/L (ref 38–126)
BUN: 14 mg/dL (ref 6–20)
CHLORIDE: 109 mmol/L (ref 101–111)
CO2: 21 mmol/L — ABNORMAL LOW (ref 22–32)
Calcium: 9.4 mg/dL (ref 8.9–10.3)
Creatinine, Ser: 0.89 mg/dL (ref 0.44–1.00)
Glucose, Bld: 109 mg/dL — ABNORMAL HIGH (ref 65–99)
POTASSIUM: 3.7 mmol/L (ref 3.5–5.1)
Sodium: 139 mmol/L (ref 135–145)
TOTAL PROTEIN: 7.4 g/dL (ref 6.5–8.1)
Total Bilirubin: 0.7 mg/dL (ref 0.3–1.2)

## 2017-04-18 LAB — CBC WITH DIFFERENTIAL/PLATELET
BASOS ABS: 0 10*3/uL (ref 0.0–0.1)
BASOS PCT: 0 %
EOS ABS: 0.1 10*3/uL (ref 0.0–0.7)
Eosinophils Relative: 1 %
HEMATOCRIT: 37.9 % (ref 36.0–46.0)
HEMOGLOBIN: 12.7 g/dL (ref 12.0–15.0)
Lymphocytes Relative: 22 %
Lymphs Abs: 1.6 10*3/uL (ref 0.7–4.0)
MCH: 30.6 pg (ref 26.0–34.0)
MCHC: 33.5 g/dL (ref 30.0–36.0)
MCV: 91.3 fL (ref 78.0–100.0)
Monocytes Absolute: 0.4 10*3/uL (ref 0.1–1.0)
Monocytes Relative: 6 %
NEUTROS ABS: 5 10*3/uL (ref 1.7–7.7)
NEUTROS PCT: 71 %
Platelets: 324 10*3/uL (ref 150–400)
RBC: 4.15 MIL/uL (ref 3.87–5.11)
RDW: 13.2 % (ref 11.5–15.5)
WBC: 7.1 10*3/uL (ref 4.0–10.5)

## 2017-04-18 LAB — URINALYSIS, ROUTINE W REFLEX MICROSCOPIC
Bilirubin Urine: NEGATIVE
Glucose, UA: NEGATIVE mg/dL
Hgb urine dipstick: NEGATIVE
Ketones, ur: NEGATIVE mg/dL
Leukocytes, UA: NEGATIVE
Nitrite: NEGATIVE
PROTEIN: NEGATIVE mg/dL
SPECIFIC GRAVITY, URINE: 1.018 (ref 1.005–1.030)
pH: 6 (ref 5.0–8.0)

## 2017-04-18 LAB — I-STAT BETA HCG BLOOD, ED (MC, WL, AP ONLY)

## 2017-04-18 LAB — LIPASE, BLOOD: LIPASE: 30 U/L (ref 11–51)

## 2017-04-18 MED ORDER — ONDANSETRON HCL 4 MG/2ML IJ SOLN
4.0000 mg | Freq: Once | INTRAMUSCULAR | Status: AC
  Administered 2017-04-18: 4 mg via INTRAVENOUS
  Filled 2017-04-18: qty 2

## 2017-04-18 MED ORDER — MORPHINE SULFATE (PF) 4 MG/ML IV SOLN
4.0000 mg | Freq: Once | INTRAVENOUS | Status: AC
  Administered 2017-04-18: 4 mg via INTRAVENOUS
  Filled 2017-04-18: qty 1

## 2017-04-18 MED ORDER — SODIUM CHLORIDE 0.9 % IV BOLUS (SEPSIS)
1000.0000 mL | Freq: Once | INTRAVENOUS | Status: AC
  Administered 2017-04-18: 1000 mL via INTRAVENOUS

## 2017-04-18 MED ORDER — SUCRALFATE 1 GM/10ML PO SUSP: 1.0000 g | Freq: Three times a day (TID) | ORAL | 0 refills | Status: DC

## 2017-04-18 MED ORDER — SODIUM CHLORIDE 0.9 % IJ SOLN
INTRAMUSCULAR | Status: AC
  Filled 2017-04-18: qty 50

## 2017-04-18 MED ORDER — ONDANSETRON 4 MG PO TBDP: 4.0000 mg | ORAL_TABLET | Freq: Three times a day (TID) | ORAL | 0 refills | Status: DC | PRN

## 2017-04-18 MED ORDER — DICYCLOMINE HCL 20 MG PO TABS: 20.0000 mg | ORAL_TABLET | Freq: Two times a day (BID) | ORAL | 0 refills | Status: DC

## 2017-04-18 MED ORDER — IOPAMIDOL (ISOVUE-300) INJECTION 61%
INTRAVENOUS | Status: AC
Start: 2017-04-18 — End: 2017-04-18
  Administered 2017-04-18: 100 mL
  Filled 2017-04-18: qty 100

## 2017-04-18 NOTE — ED Provider Notes (Signed)
Emergency Department Provider Note   I have reviewed the triage vital signs and the nursing notes.   HISTORY  Chief Complaint Abdominal Pain   HPI Katie Reed is a 37 y.o. female with PMH of tubulovillous adenoma presents to the emergency department for evaluation of central abdominal pain with nausea, vomiting, and diarrhea.  The patient was experiencing some constipation symptoms and took a laxative.  The diarrhea and pain started shortly afterwards.  She has not noticed any blood in the stool or vomit.  States this feels similar to when she was diagnosed with her precancerous colon abnormality.  Patient states that she had a colonoscopy and was referred to a surgeon at [facility].  She is trying to get insurance and in the process of securing this with [facility].  Expects to have an appointment in the next several weeks.  No radiation of symptoms or modifying factors.    Past Medical History:  Diagnosis Date  . Anxiety   . Depression   . Preterm labor     Patient Active Problem List   Diagnosis Date Noted  . Intussusception intestine ([place]) 02/19/2017  . Depression   . Anxiety   . Active labor 04/01/2015  . ASCUS with positive high risk HPV 03/23/2015  . Supervision of high-risk pregnancy 12/31/2014  . High risk pregnancy due to history of preterm labor 12/31/2014  . AMA (advanced maternal age) multigravida 35+ 12/31/2014  . Obesity in pregnancy 12/31/2014  . Tobacco smoking affecting pregnancy, antepartum 12/31/2014  . Abnormal glucose tolerance test (GTT) during pregnancy, antepartum 12/31/2014    Past Surgical History:  Procedure Laterality Date  . FLEXIBLE SIGMOIDOSCOPY N/A 02/19/2017   Procedure: FLEXIBLE SIGMOIDOSCOPY;  Surgeon: Otis Brace, MD;  Location: [facility];  Service: Gastroenterology;  Laterality: N/A;  . NO PAST SURGERIES    . THERAPEUTIC ABORTION     elective    Current Outpatient Rx  . Order #: [id number] Class: Historical Med  . Order #:  [id number] Class: Print  . Order #: [id number] Class: Print  . Order #: [id number] Class: Print  . Order #: [id number] Class: Print    Allergies Patient has no known allergies.  Family History  Problem Relation Age of Onset  . Hypertension Mother   . Alcohol abuse Neg Hx   . Arthritis Neg Hx   . Asthma Neg Hx   . Birth defects Neg Hx   . Cancer Neg Hx   . COPD Neg Hx   . Depression Neg Hx   . Diabetes Neg Hx   . Drug abuse Neg Hx   . Early death Neg Hx   . Hearing loss Neg Hx   . Heart disease Neg Hx   . Hyperlipidemia Neg Hx   . Kidney disease Neg Hx   . Learning disabilities Neg Hx   . Mental illness Neg Hx   . Mental retardation Neg Hx   . Miscarriages / Stillbirths Neg Hx   . Stroke Neg Hx   . Vision loss Neg Hx   . Varicose Veins Neg Hx     Social History Social History   Tobacco Use  . Smoking status: Current Some Day Smoker    Packs/day: 0.25    Years: 14.00    Pack years: 3.50    Types: Cigarettes  . Smokeless tobacco: Never Used  Substance Use Topics  . Alcohol use: Yes    Alcohol/week: 0.6 oz    Types: 1 Glasses of wine per week    Comment: 1-2  times per week  . Drug use: No    Review of Systems  Constitutional: No fever/chills Eyes: No visual changes. ENT: No sore throat. Cardiovascular: Denies chest pain. Respiratory: Denies shortness of breath. Gastrointestinal: Positive abdominal pain. Positive nausea, vomiting, and diarrhea.  No constipation. Genitourinary: Negative for dysuria. Musculoskeletal: Negative for back pain. Skin: Negative for rash. Neurological: Negative for headaches, focal weakness or numbness.  10-point ROS otherwise negative.  ____________________________________________   PHYSICAL EXAM:  VITAL SIGNS: ED Triage Vitals  Enc Vitals Group     BP 04/18/17 1909 122/84     Pulse Rate 04/18/17 1909 78     Resp 04/18/17 1909 18     Temp 04/18/17 1909 98.1 F (36.7 C)     Temp Source 04/18/17 1909 Oral     SpO2 04/18/17  1909 97 %     Weight 04/18/17 1934 202 lb (91.6 kg)     Height 04/18/17 1934 5\' 3"  (1.6 m)     Pain Score 04/18/17 1934 8   Constitutional: Alert and oriented. Well appearing and in no acute distress. Eyes: Conjunctivae are normal. Head: Atraumatic. Nose: No congestion/rhinnorhea. Mouth/Throat: Mucous membranes are moist.  Oropharynx non-erythematous. Neck: No stridor.   Cardiovascular: Normal rate, regular rhythm. Good peripheral circulation. Grossly normal heart sounds.   Respiratory: Normal respiratory effort.  No retractions. Lungs CTAB. Gastrointestinal: Soft with mild periumbilical tenderness without rebound or guarding. No distention.  Musculoskeletal: No lower extremity tenderness nor edema. No gross deformities of extremities. Neurologic:  Normal speech and language. No gross focal neurologic deficits are appreciated.  Skin:  Skin is warm, dry and intact. No rash noted. ____________________________________________   LABS (all labs ordered are listed, but only abnormal results are displayed)  Labs Reviewed  COMPREHENSIVE METABOLIC PANEL - Abnormal; Notable for the following components:      Result Value   CO2 21 (*)    Glucose, Bld 109 (*)    All other components within normal limits  LIPASE, BLOOD  URINALYSIS, ROUTINE W REFLEX MICROSCOPIC  CBC WITH DIFFERENTIAL/PLATELET  I-STAT BETA HCG BLOOD, ED ([facility], [facility], [facility] ONLY)   ____________________________________________  RADIOLOGY  Ct Abdomen Pelvis W Contrast  Result Date: 04/18/2017 CLINICAL DATA:  Acute abdominal pain.  Nausea and vomiting. EXAM: CT ABDOMEN AND PELVIS WITH CONTRAST TECHNIQUE: Multidetector CT imaging of the abdomen and pelvis was performed using the standard protocol following bolus administration of intravenous contrast. CONTRAST:  155mL ISOVUE-300 IOPAMIDOL (ISOVUE-300) INJECTION 61% COMPARISON:  02/19/2017 FINDINGS: Lower chest: No consolidation or pleural fluid. Hepatobiliary: No focal hepatic lesion.  Gallbladder physiologically distended. Gallstone on prior ultrasound not well seen by CT. No biliary dilatation. Pancreas: No ductal dilatation or inflammation. Spleen: Normal in size without focal abnormality. Adrenals/Urinary Tract: Normal adrenal glands. No hydronephrosis or perinephric edema. Homogeneous renal enhancement. Urinary bladder is partially distended without wall thickening. Stomach/Bowel: Stomach physiologically distended. Previous distended small bowel loops have resolved. Normal appendix. Moderate colonic stool burden. Previous sigmoid colonic intussusception has resolved. No residual colonic wall thickening. No bowel inflammation. Vascular/Lymphatic: No significant vascular findings are present. No enlarged abdominal or pelvic lymph nodes. Reproductive: Uterus and bilateral adnexa are unremarkable. Other: No free air, free fluid, or intra-abdominal fluid collection. Musculoskeletal: There are no acute or suspicious osseous abnormalities. IMPRESSION: No acute abnormality or explanation for abdominal pain. Previous short segment sigmoid intussusception has resolved. Electronically Signed   By: Jeb Levering M.D.   On: 04/18/2017 22:15    ____________________________________________   PROCEDURES  Procedure(s)  performed:   Procedures  None ____________________________________________   INITIAL IMPRESSION / ASSESSMENT AND PLAN / ED COURSE  Pertinent labs & imaging results that were available during my care of the patient were reviewed by me and considered in my medical decision making (see chart for details).  She presents to the emergency department for evaluation of abdominal pain with nausea, vomiting, diarrhea.  She has mild periumbilical tenderness.  Reviewed the last CT scan which showed concern for intussusception which led to colonoscopy.  No intussusception was found during the procedure but she was diagnosed with tubulovillous adenoma with high-grade dysplasia.  She is  in the process of following up.  Given her acute onset symptoms that are similar to prior plan for CT abdomen pelvis, IV fluids, pain medication and reassess.  CT scan and lab work reviewed.  No acute findings.  Patient feeling better after IV fluids and medications.  Suspect possible viral etiology.  She is actively following up with providers at [facility] and attempting to establish insurance coverage.  Discussed return precautions in detail.  Discharge medications for supportive care.  At this time, I do not feel there is any life-threatening condition present. I have reviewed and discussed all results (EKG, imaging, lab, urine as appropriate), exam findings with patient. I have reviewed nursing notes and appropriate previous records.  I feel the patient is safe to be discharged home without further emergent workup. Discussed usual and customary return precautions. Patient and family (if present) verbalize understanding and are comfortable with this plan.  Patient will follow-up with their primary care provider. If they do not have a primary care provider, information for follow-up has been provided to them. All questions have been answered.  ____________________________________________  FINAL CLINICAL IMPRESSION(S) / ED DIAGNOSES  Final diagnoses:  Generalized abdominal pain  Non-intractable vomiting with nausea, unspecified vomiting type  Diarrhea, unspecified type     MEDICATIONS GIVEN DURING THIS VISIT:  Medications  sodium chloride 0.9 % injection (not administered)  sodium chloride 0.9 % bolus 1,000 mL (0 mLs Intravenous Stopped 04/18/17 2229)  ondansetron (ZOFRAN) injection 4 mg (4 mg Intravenous Given 04/18/17 2015)  morphine 4 MG/ML injection 4 mg (4 mg Intravenous Given 04/18/17 2014)  iopamidol (ISOVUE-300) 61 % injection (100 mLs  Contrast Given 04/18/17 2153)     NEW OUTPATIENT MEDICATIONS STARTED DURING THIS VISIT:  Discharge Medication List as of 04/18/2017 10:29 PM    START  taking these medications   Details  dicyclomine (BENTYL) 20 MG tablet Take 1 tablet (20 mg total) by mouth 2 (two) times daily., Starting Wed 04/18/2017, Print    ondansetron (ZOFRAN ODT) 4 MG disintegrating tablet Take 1 tablet (4 mg total) by mouth every 8 (eight) hours as needed for nausea or vomiting., Starting Wed 04/18/2017, Print    sucralfate (CARAFATE) 1 GM/10ML suspension Take 10 mLs (1 g total) by mouth 4 (four) times daily -  with meals and at bedtime., Starting Wed 04/18/2017, Print        Note:  This document was prepared using Dragon voice recognition software and may include unintentional dictation errors.  Nanda Quinton, MD Emergency Medicine    Long, [facility], MD 04/19/17 0000

## 2017-04-18 NOTE — ED Triage Notes (Signed)
Patient is from home and transported via [facility]. Patient is complaining of abd cramps, nausea, vomiting, and dizziness. EMS reports she has had precancerous tumor in her colon. Symptoms started 8 hours ago. She reports she took Motrin with little relief.

## 2017-04-18 NOTE — Discharge Instructions (Signed)

## 2017-05-01 ENCOUNTER — Other Ambulatory Visit: Payer: Self-pay

## 2017-05-01 ENCOUNTER — Ambulatory Visit (INDEPENDENT_AMBULATORY_CARE_PROVIDER_SITE_OTHER): Payer: Self-pay | Admitting: Physician Assistant

## 2017-05-01 ENCOUNTER — Encounter (INDEPENDENT_AMBULATORY_CARE_PROVIDER_SITE_OTHER): Payer: Self-pay | Admitting: Physician Assistant

## 2017-05-01 VITALS — BP 116/76 | HR 66 | Temp 97.8°F | Ht 63.39 in | Wt 197.4 lb

## 2017-05-01 DIAGNOSIS — D126 Benign neoplasm of colon, unspecified: Secondary | ICD-10-CM

## 2017-05-01 DIAGNOSIS — R1013 Epigastric pain: Secondary | ICD-10-CM

## 2017-05-01 DIAGNOSIS — R1032 Left lower quadrant pain: Secondary | ICD-10-CM

## 2017-05-01 DIAGNOSIS — F418 Other specified anxiety disorders: Secondary | ICD-10-CM

## 2017-05-01 MED ORDER — CLONAZEPAM 0.5 MG PO TABS: 0.5000 mg | ORAL_TABLET | Freq: Two times a day (BID) | ORAL | 1 refills | Status: DC | PRN

## 2017-05-01 MED ORDER — OMEPRAZOLE 40 MG PO CPDR: 40.0000 mg | DELAYED_RELEASE_CAPSULE | Freq: Every day | ORAL | 3 refills | Status: DC

## 2017-05-01 MED ORDER — ESCITALOPRAM OXALATE 10 MG PO TABS: 10.0000 mg | ORAL_TABLET | Freq: Every day | ORAL | 2 refills | Status: DC

## 2017-05-01 NOTE — Progress Notes (Signed)
Subjective:  Patient ID: Katie Reed, female    DOB: [birth year]  Age: 37 y.o. MRN: [id number]  CC: tumor in colon  HPI Katie Reed is a 37 y.o. female with a medical history of anxiety, depression, self reported lactose intolerance, preterm labor, and sigmoid intussesception presents as a new patient with concern for financial coverage for a recommeded colectomy. She was found to have tubulovillous adenoma of the sigmoid colon more than two months ago. Patient is distraught in that Medicaid will not help her pay for her colectomy. Does not know what she can do to help pay for her procedure. Feels constipated at times but also can go frequently to have a bowel movement. Associated with nausea. Takes Zofran and Bentyl as needed. Says her depression and anxiety have been triggered since news of her precancerous colonic polyp. Does not endorse any other symptoms, complaints, or suicidal ideation/intent.      Outpatient Medications Prior to Visit  Medication Sig Dispense Refill  . dicyclomine (BENTYL) 20 MG tablet Take 1 tablet (20 mg total) by mouth 2 (two) times daily. 20 tablet 0  . ondansetron (ZOFRAN ODT) 4 MG disintegrating tablet Take 1 tablet (4 mg total) by mouth every 8 (eight) hours as needed for nausea or vomiting. 20 tablet 0  . acetaminophen (TYLENOL) 500 MG tablet Take 500 mg by mouth every 6 (six) hours as needed for mild pain.    . cyclobenzaprine (FLEXERIL) 5 MG tablet Take 1 tablet (5 mg total) by mouth 3 (three) times daily as needed for muscle spasms. (Patient not taking: Reported on 02/19/2017) 9 tablet 0  . sucralfate (CARAFATE) 1 GM/10ML suspension Take 10 mLs (1 g total) by mouth 4 (four) times daily -  with meals and at bedtime. (Patient not taking: Reported on 05/01/2017) 420 mL 0   No facility-administered medications prior to visit.      ROS Review of Systems  Constitutional: Negative for chills, fever and malaise/fatigue.  Eyes: Negative for blurred  vision.  Respiratory: Negative for shortness of breath.   Cardiovascular: Negative for chest pain and palpitations.  Gastrointestinal: Positive for abdominal pain (occasionally). Negative for nausea.  Genitourinary: Negative for dysuria and hematuria.  Musculoskeletal: Negative for joint pain and myalgias.  Skin: Negative for rash.  Neurological: Negative for tingling and headaches.  Psychiatric/Behavioral: Negative for depression. The patient is not nervous/anxious.     Objective:  BP 116/76 (BP Location: Right Arm, Patient Position: Sitting, Cuff Size: Normal)   Pulse 66   Temp 97.8 F (36.6 C) (Oral)   Ht 5' 3.39" (1.61 m)   Wt 197 lb 6.4 oz (89.5 kg)   LMP 04/29/2017 (Exact Date)   SpO2 98%   Breastfeeding? No   BMI 34.54 kg/m   BP/Weight 05/01/2017 04/18/2017 99/37/1696  Systolic BP 789 381 017  Diastolic BP 76 70 72  Wt. (Lbs) 197.4 202 201.5  BMI 34.54 35.78 35.69      Physical Exam  Constitutional: She is oriented to person, place, and time.  Thin, unkept, NAD, polite  HENT:  Head: Normocephalic and atraumatic.  Eyes: No scleral icterus.  Neck: Normal range of motion. Neck supple. No thyromegaly present.  Cardiovascular: Normal rate, regular rhythm and normal heart sounds.  Pulmonary/Chest: Effort normal and breath sounds normal.  Abdominal: Soft. Bowel sounds are normal. There is tenderness (Mild LLQ tenderness to palpation).  Musculoskeletal: She exhibits no edema.  Neurological: She is alert and oriented to person, place, and time.  Skin: Skin is warm and dry. No rash noted. No erythema. No pallor.  Psychiatric: She has a normal mood and affect. Her behavior is normal. Thought content normal.  Vitals reviewed.    Assessment & Plan:    1. Tubulovillous adenoma of large intestine - Recommended patient fill application for [employer] so she may be referred to GI.   2. Left lower quadrant pain - Advised to use Bentyl as needed and  as directed on label.  3. Abdominal pain, epigastric - Omeprazole 40 mg one tablet qday x30 days, #30   4. Depression with anxiety - escitalopram (LEXAPRO) 10 MG tablet; Take 1 tablet (10 mg total) by mouth daily.  Dispense: 30 tablet; Refill: 2 - clonazePAM (KLONOPIN) 0.5 MG tablet; Take 1 tablet (0.5 mg total) by mouth 2 (two) times daily as needed for anxiety.  Dispense: 20 tablet; Refill: 1    Follow-up: Return in about 4 weeks (around 05/29/2017) for depression with anxiety.   Clent Demark PA

## 2017-05-01 NOTE — Patient Instructions (Signed)
Community Resources  Advocacy/Legal Legal Aid [place]:  1-866-219-5262  /  336-272-0148  Family Justice Center:  336-641-7233  [facility] [place] 24-hr Crisis line:  336-273-7273  Women's Resource Center, [place]:  336-275-6090  Court Watch (custody):  336-275-2346  [place] [facility]:   336-279-9299    Baby & Breastfeeding Car Seat Inspection @ Various [place] Fire Depts.- call 336-373-2177  [facility]  336-832-6860  [facility] 336-878-6712  WIC: 336-641-3663 ([place]);  336-641-7571 ([place])  La Leche League:  1-877-452-5321   Childcare [place] [facility]: 336-369-5097 ([place]) / 336-887-8224 ([place])  - Child Care Resources/ Referrals/ Scholarships  - Head Start/ Early Head Start (call or apply online)  [place] DHHS: [place] Pre-[facility] :  1-800-859-0829 / 336-274-5437   Employment / Job Search [facility] [place]: 336-275-6090 / [address]  [place] [employer] [facility] ([employer]Link): 336-373-5922 ([place]) / 336-882-4141 ([place])  [facility]/ Career Center: 336-275-9801 / 336-282-7307  [facility] Job [facility] Career Center: 336-373-3764  DHHS Work First: 336-641-3447 ([place]) / 336-641-3447 ([place])  [facility] [place]:  336-676-5871   Financial Assistance [place]:  336-553-2657  [employer]: 336-235-0368  [employer] [facility] (furniture):  336-370-4002  [place] [facility]: 336-373-4264  Low Income Energy Assistance  336-641-3000   Food Assistance DHHS- SNAP/ Food Stamps: 336-641-4588  WIC: [place]- 336-641-3663 ;  [place] 336-641-7571  Little [place] Book- Free Meals  Little Blue Book- Free Food Pantries  During the summer, text "FOOD" to 877877   General Health / Clinics (Adults) Orange Card (for Adults) through [facility]: (336) 895-4900  [facility]:   336-832-8035  [facility]:   336-832-4444  Health Department:  336-641-3245  [facility]:  336-415-3877 / 336-641-2100  [facility]anned [facility] [place]:   336-373-0678  [facility]:   336-334-4822 x 50251   Housing [place] [facility]:   336-691-9521  [place] [facility]:  336-275-8501  Affordable Housing Managemnt:  336-273-0568   Immigrant/ Refugee Center for [place] (UNCG):  336-256-1065  [facility]:  336-379-0037  [facility] [facility]:  336-937-4701  [employer] World Services:  336-617-0381  African Services Coalition:  336-574-2677   LGBTQ YouthSAFE  www.youthsafe[place].org  PFLAG  336-541-6754 / info@[place].org  The Trevor Project:  1-866-488-7386   Mental Health/ Substance Use [facility]  336-387-6161  [facility]:  336-832-9700 or 1-800-711-2635  [facility] of Care:  336-271-5888  [facility]:  336-294-1349  [facility]:  336-542-2884  [facility] (walk-ins)  336-676-6840 / [address]  [place]:  800-449-1287  Alcoholics Anonymous:  336-854-4278  Narcotics Anonymous:  800-365-1036  Quit Smoking Hotline:  800-QUIT-NOW (800-784-8669)   Parenting Children's Home Society:  800-632-1400  [place]: Education Center & Support Groups:  336-832-6682  YWCA: 336-273-3461  UNCG: Bringing Out the Best:  336-334-3120               Thriving at Three (Hispanic families): 336-256-1066  Healthy Start ([facility]):  336-387-6161 x2288  Parents as Teachers:  336-691-0024  [place] [facility]- Learning Together (Immigrants): 336-369-5001   Poison Control 800-222-1222  Sports & Recreation [employer] Open Doors Application: ymcanwnc.org/join/open-doors-financial-assistance/  City of [place] Recreation Centers: http://www.[place].gov/index.aspx?page=3615   Special Needs Family Support Network:  336-832-6507  Autism Society of [place]:   336-333-0197 x1402 or x1412 /  800-785-1035  TEACCH [place]:  336-334-5773     ARC of [place]:  [place] (CDSA):  [phone]  [facility] (Care Coordination for Children):  4346276531   Transportation Medicaid Transportation: (551)248-5950 to apply  [place]: 754-685-0525 (reduced-fare bus ID to [place])  SCAT Paratransit services: Eligible riders only, call 989-211-9417 for application   Tutoring/Mentoring [place]: [place]: 682-642-0210 Letta Kocher)  213-462-6163 ([place])  ACES through child's school: [place]: contact your local [place] Program: 8163306090      Living With Anxiety After being diagnosed with an anxiety disorder, you may be relieved to know why you have felt or behaved a certain way. It is natural to also feel overwhelmed about the treatment ahead and what it will mean for your life. With care and support, you can manage this condition and recover from it. How to cope with anxiety Dealing with stress Stress is your body's reaction to life changes and events, both good and bad. Stress can last just a few hours or it can be ongoing. Stress can play a major role in anxiety, so it is important to learn both how to cope with stress and how to think about it differently. Talk with your health care provider or a counselor to learn more about stress reduction. He or she may suggest some stress reduction techniques, such as:  Music therapy. This can include creating or listening to music that you enjoy and that inspires you.  Mindfulness-based meditation. This involves being aware of your normal breaths, rather than trying to control your breathing. It can be done while sitting or walking.  Centering prayer. This is a kind of meditation that involves focusing on a word, phrase, or sacred image that is meaningful to you and that brings you peace.  Deep breathing. To do this, expand your  stomach and inhale slowly through your nose. Hold your breath for 3-5 seconds. Then exhale slowly, allowing your stomach muscles to relax.  Self-talk. This is a skill where you identify thought patterns that lead to anxiety reactions and correct those thoughts.  Muscle relaxation. This involves tensing muscles then relaxing them.  Choose a stress reduction technique that fits your lifestyle and personality. Stress reduction techniques take time and practice. Set aside 5-15 minutes a day to do them. Therapists can offer training in these techniques. The training may be covered by some insurance plans. Other things you can do to manage stress include:  Keeping a stress diary. This can help you learn what triggers your stress and ways to control your response.  Thinking about how you respond to certain situations. You may not be able to control everything, but you can control your reaction.  Making time for activities that help you relax, and not feeling guilty about spending your time in this way.  Therapy combined with coping and stress-reduction skills provides the best chance for successful treatment. Medicines Medicines can help ease symptoms. Medicines for anxiety include:  Anti-anxiety drugs.  Antidepressants.  Beta-blockers.  Medicines may be used as the main treatment for anxiety disorder, along with therapy, or if other treatments are not working. Medicines should be prescribed by a health care provider. Relationships Relationships can play a big part in helping you recover. Try to spend more time connecting with trusted friends and family members. Consider going to couples counseling, taking family education classes, or going to family therapy. Therapy can help you and others better understand the condition. How to  recognize changes in your condition Everyone has a different response to treatment for anxiety. Recovery from anxiety happens when symptoms decrease and stop  interfering with your daily activities at home or work. This may mean that you will start to:  Have better concentration and focus.  Sleep better.  Be less irritable.  Have more energy.  Have improved memory.  It is important to recognize when your condition is getting worse. Contact your health care provider if your symptoms interfere with home or work and you do not feel like your condition is improving. Where to find help and support: You can get help and support from these sources:  Self-help groups.  Online and [employer].  A trusted spiritual leader.  Couples counseling.  Family education classes.  Family therapy.  Follow these instructions at home:  Eat a healthy diet that includes plenty of vegetables, fruits, whole grains, low-fat dairy products, and lean protein. Do not eat a lot of foods that are high in solid fats, added sugars, or salt.  Exercise. Most adults should do the following: ? Exercise for at least 150 minutes each week. The exercise should increase your heart rate and make you sweat (moderate-intensity exercise). ? Strengthening exercises at least twice a week.  Cut down on caffeine, tobacco, alcohol, and other potentially harmful substances.  Get the right amount and quality of sleep. Most adults need 7-9 hours of sleep each night.  Make choices that simplify your life.  Take over-the-counter and prescription medicines only as told by your health care provider.  Avoid caffeine, alcohol, and certain over-the-counter cold medicines. These may make you feel worse. Ask your pharmacist which medicines to avoid.  Keep all follow-up visits as told by your health care provider. This is important. Questions to ask your health care provider  Would I benefit from therapy?  How often should I follow up with a health care provider?  How long do I need to take medicine?  Are there any long-term side effects of my medicine?  Are there any  alternatives to taking medicine? Contact a health care provider if:  You have a hard time staying focused or finishing daily tasks.  You spend many hours a day feeling worried about everyday life.  You become exhausted by worry.  You start to have headaches, feel tense, or have nausea.  You urinate more than normal.  You have diarrhea. Get help right away if:  You have a racing heart and shortness of breath.  You have thoughts of hurting yourself or others. If you ever feel like you may hurt yourself or others, or have thoughts about taking your own life, get help right away. You can go to your nearest emergency department or call:  Your local emergency services (911 in the [place]  A suicide crisis helpline, such as the [place] at [phone]. This is open 24-hours a day.  Summary  Taking steps to deal with stress can help calm you.  Medicines cannot cure anxiety disorders, but they can help ease symptoms.  Family, friends, and partners can play a big part in helping you recover from an anxiety disorder. This information is not intended to replace advice given to you by your health care provider. Make sure you discuss any questions you have with your health care provider. Document Released: 02/08/2016 Document Revised: 02/08/2016 Document Reviewed: 02/08/2016 Elsevier Interactive Patient Education  [employer].

## 2017-05-30 ENCOUNTER — Ambulatory Visit (INDEPENDENT_AMBULATORY_CARE_PROVIDER_SITE_OTHER): Payer: Self-pay | Admitting: Physician Assistant

## 2017-06-21 ENCOUNTER — Inpatient Hospital Stay (HOSPITAL_COMMUNITY)
Admission: AD | Admit: 2017-06-21 | Discharge: 2017-06-22 | Disposition: A | Discharge status: Home / Self Care | Payer: Self-pay | Source: Ambulatory Visit | Attending: Obstetrics and Gynecology | Admitting: Obstetrics and Gynecology

## 2017-06-21 ENCOUNTER — Encounter (HOSPITAL_COMMUNITY): Payer: Self-pay | Admitting: *Deleted

## 2017-06-21 DIAGNOSIS — F17210 Nicotine dependence, cigarettes, uncomplicated: Secondary | ICD-10-CM | POA: Insufficient documentation

## 2017-06-21 DIAGNOSIS — N939 Abnormal uterine and vaginal bleeding, unspecified: Secondary | ICD-10-CM | POA: Insufficient documentation

## 2017-06-21 DIAGNOSIS — R109 Unspecified abdominal pain: Secondary | ICD-10-CM | POA: Insufficient documentation

## 2017-06-21 DIAGNOSIS — N930 Postcoital and contact bleeding: Secondary | ICD-10-CM

## 2017-06-21 LAB — URINALYSIS, ROUTINE W REFLEX MICROSCOPIC
Bacteria, UA: NONE SEEN
Bilirubin Urine: NEGATIVE
Glucose, UA: NEGATIVE mg/dL
Ketones, ur: NEGATIVE mg/dL
Leukocytes, UA: NEGATIVE
Nitrite: NEGATIVE
PROTEIN: NEGATIVE mg/dL
SPECIFIC GRAVITY, URINE: 1.018 (ref 1.005–1.030)
pH: 5 (ref 5.0–8.0)

## 2017-06-21 LAB — CBC
HEMATOCRIT: 37.8 % (ref 36.0–46.0)
HEMOGLOBIN: 12.7 g/dL (ref 12.0–15.0)
MCH: 30.4 pg (ref 26.0–34.0)
MCHC: 33.6 g/dL (ref 30.0–36.0)
MCV: 90.4 fL (ref 78.0–100.0)
Platelets: 371 10*3/uL (ref 150–400)
RBC: 4.18 MIL/uL (ref 3.87–5.11)
RDW: 13.5 % (ref 11.5–15.5)
WBC: 8.6 10*3/uL (ref 4.0–10.5)

## 2017-06-21 NOTE — MAU Provider Note (Signed)
Chief Complaint:  Abdominal Pain   First Provider Initiated Contact with Patient 06/21/17 2352       HPI: Katie Reed is a 37 y.o. [id number] who presents to maternity admissions reporting vaginal bleeding since March.  Was on DepoProvera and stopped it.  Had no bleeding until March.  Told RN she was bleeding heavily every day, but tells me it is only after intercourse, about once a week.  Soaks tampon in 2 hours.  . She reports vaginal bleeding, no vaginal itching/burning, urinary symptoms, h/a, dizziness, n/v, or fever/chills.    Is scheduled to have surgery for a precancerous growth in her colon next month.  Abdominal Pain  This is a new problem. The current episode started 1 to 4 weeks ago. The onset quality is gradual. The problem occurs intermittently. The problem has been unchanged. The pain is located in the LLQ, RLQ and generalized abdominal region. The quality of the pain is cramping. The abdominal pain does not radiate. Pertinent negatives include no fever. Nothing aggravates the pain. The pain is relieved by nothing. She has tried nothing for the symptoms.  Vaginal Bleeding  The patient's primary symptoms include vaginal bleeding. The patient's pertinent negatives include no genital itching, genital lesions or genital odor. This is a recurrent problem. The current episode started 1 to 4 weeks ago. The problem has been waxing and waning. She is not pregnant. Associated symptoms include abdominal pain. Pertinent negatives include no fever. The vaginal discharge was bloody. The vaginal bleeding is heavier than menses. She has not been passing clots. She has not been passing tissue. Nothing aggravates the symptoms. She has tried nothing for the symptoms.   RN Note PT SAYS  LAST DEPO SHOT   WAS 09-2016-    GOT  AT  HD.     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   THEN STARTED VAG BLEEDING -  OFF/ ON - EVER SINCE.     WENT TO [facility]   LAST MTH- DID ANOTHER CT- TUMOR  STILL THERE .   [facility]   FOR  SURGERY ON 07-02-2017       NOW  HAS  ABD PAIN  AND  BLEEDING-  ALL WORSE  AFTER SEX-   .   PAD ON IN TRIAGE -   NOTHING.Marland Kitchen    PAIN MED- COLAZAPAM  FOR ANXIETY    Past Medical History: Past Medical History:  Diagnosis Date  . Anxiety   . Depression   . Preterm labor     Past obstetric history: OB History  Gravida Para Term Preterm AB Living  6 5 4 1 1 5   SAB TAB Ectopic Multiple Live Births  0 1 0 0 5    # Outcome Date GA Lbr Len/2nd Weight Sex Delivery Anes PTL Lv  6 Term 04/02/15 [redacted]w[redacted]d 08:39 / 00:06 7 lb 1.6 oz (3.221 kg) M Vag-Spont None  LIV  5 Preterm 03/18/13 [redacted]w[redacted]d 23:48 / 00:16 5 lb 2.5 oz (2.34 kg) F Vag-Spont None  LIV     Birth Comments: preterm  4 Term 05/05/08 [redacted]w[redacted]d  7 lb (3.175 kg) F Vag-Spont None  LIV  3 TAB 2005 [redacted]w[redacted]d         2 Term 08/28/01 [redacted]w[redacted]d  7 lb (3.175 kg) F Vag-Spont None N LIV  1 Term 06/26/97 [redacted]w[redacted]d  9 lb 8 oz (4.309 kg) M Vag-Spont  None N LIV    Past Surgical History: Past Surgical History:  Procedure Laterality Date  . FLEXIBLE SIGMOIDOSCOPY N/A 02/19/2017   Procedure: FLEXIBLE SIGMOIDOSCOPY;  Surgeon: Otis Brace, MD;  Location: [facility];  Service: Gastroenterology;  Laterality: N/A;  . NO PAST SURGERIES    . THERAPEUTIC ABORTION     elective    Family History: Family History  Problem Relation Age of Onset  . Hypertension Mother   . Alcohol abuse Neg Hx   . Arthritis Neg Hx   . Asthma Neg Hx   . Birth defects Neg Hx   . Cancer Neg Hx   . COPD Neg Hx   . Depression Neg Hx   . Diabetes Neg Hx   . Drug abuse Neg Hx   . Early death Neg Hx   . Hearing loss Neg Hx   . Heart disease Neg Hx   . Hyperlipidemia Neg Hx   . Kidney disease Neg Hx   . Learning disabilities Neg Hx   . Mental illness Neg Hx   . Mental retardation Neg Hx   . Miscarriages / Stillbirths Neg Hx   . Stroke Neg Hx   . Vision loss Neg Hx   . Varicose Veins Neg Hx     Social  History: Social History   Tobacco Use  . Smoking status: Current Some Day Smoker    Packs/day: 0.25    Years: 14.00    Pack years: 3.50    Types: Cigarettes  . Smokeless tobacco: Never Used  Substance Use Topics  . Alcohol use: Yes    Alcohol/week: 0.6 oz    Types: 1 Glasses of wine per week    Comment: 1-2 times per week  . Drug use: No    Allergies: No Known Allergies  Meds:  Medications Prior to Admission  Medication Sig Dispense Refill Last Dose  . clonazePAM (KLONOPIN) 0.5 MG tablet Take 1 tablet (0.5 mg total) by mouth 2 (two) times daily as needed for anxiety. 20 tablet 1 06/21/2017 at Unknown time  . dicyclomine (BENTYL) 20 MG tablet Take 1 tablet (20 mg total) by mouth 2 (two) times daily. 20 tablet 0 Past Month at Unknown time  . ibuprofen (ADVIL,MOTRIN) 200 MG tablet Take 200 mg by mouth every 6 (six) hours as needed for cramping.   06/20/2017 at Unknown time  . Multiple Vitamin (MULTIVITAMIN WITH MINERALS) TABS tablet Take 1 tablet by mouth daily.   Past Month at Unknown time  . escitalopram (LEXAPRO) 10 MG tablet Take 1 tablet (10 mg total) by mouth daily. (Patient not taking: Reported on 06/21/2017) 30 tablet 2 Not Taking at Unknown time  . omeprazole (PRILOSEC) 40 MG capsule Take 1 capsule (40 mg total) by mouth daily. (Patient not taking: Reported on 06/21/2017) 30 capsule 3 Not Taking at Unknown time  . ondansetron (ZOFRAN ODT) 4 MG disintegrating tablet Take 1 tablet (4 mg total) by mouth every 8 (eight) hours as needed for nausea or vomiting. (Patient not taking: Reported on 06/21/2017) 20 tablet 0 Not Taking at Unknown time  . sucralfate (CARAFATE) 1 GM/10ML suspension Take 10 mLs (1 g total) by mouth 4 (four) times daily -  with meals and at bedtime. (Patient not taking: Reported on 05/01/2017) 420 mL 0 Not Taking at Unknown time    I have reviewed patient's Past Medical Hx, Surgical Hx, Family Hx, Social Hx, medications and allergies.  ROS:  Review of Systems   Constitutional: Negative for fever.  Gastrointestinal: Positive for abdominal pain.  Genitourinary: Positive for vaginal bleeding.   Other systems negative     Physical Exam   Patient Vitals for the past 24 hrs:  BP Temp Pulse Height Weight  06/21/17 2050 113/71 99 F (37.2 C) 75 5\' 3"  (1.6 m) 195 lb 12 oz (88.8 kg)   Constitutional: Well-developed, well-nourished female in no acute distress.  Cardiovascular: normal rate and rhythm, no ectopy audible, S1 & S2 heard, no murmur Respiratory: normal effort, no distress. Lungs CTAB with no wheezes or crackles GI: Abd soft, non-tender.  Nondistended.  No rebound, No guarding.  Bowel Sounds audible  MS: Extremities nontender, no edema, normal ROM Neurologic: Alert and oriented x 4.   Grossly nonfocal. GU: Neg CVAT. Skin:  Warm and Dry Psych:  Affect appropriate.  PELVIC EXAM: Cervix pink, visually closed, without lesion, scant red mucous discharge, vaginal walls and external genitalia normal   Labs: Results for orders placed or performed during the hospital encounter of 06/21/17 (from the past 24 hour(s))  Urinalysis, Routine w reflex microscopic     Status: Abnormal   Collection Time: 06/21/17  8:54 PM  Result Value Ref Range   Color, Urine YELLOW YELLOW   APPearance CLEAR CLEAR   Specific Gravity, Urine 1.018 1.005 - 1.030   pH 5.0 5.0 - 8.0   Glucose, UA NEGATIVE NEGATIVE mg/dL   Hgb urine dipstick SMALL (A) NEGATIVE   Bilirubin Urine NEGATIVE NEGATIVE   Ketones, ur NEGATIVE NEGATIVE mg/dL   Protein, ur NEGATIVE NEGATIVE mg/dL   Nitrite NEGATIVE NEGATIVE   Leukocytes, UA NEGATIVE NEGATIVE   RBC / HPF 0-5 0 - 5 RBC/hpf   WBC, UA 0-5 0 - 5 WBC/hpf   Bacteria, UA NONE SEEN NONE SEEN   Squamous Epithelial / LPF 0-5 0 - 5  CBC     Status: None   Collection Time: 06/21/17  9:48 PM  Result Value Ref Range   WBC 8.6 4.0 - 10.5 K/uL   RBC 4.18 3.87 - 5.11 MIL/uL   Hemoglobin 12.7 12.0 - 15.0 g/dL   HCT 37.8 36.0 - 46.0 %    MCV 90.4 78.0 - 100.0 fL   MCH 30.4 26.0 - 34.0 pg   MCHC 33.6 30.0 - 36.0 g/dL   RDW 13.5 11.5 - 15.5 %   Platelets 371 150 - 400 K/uL    2 months ago:  Hgb 12.7  Imaging:  No results found.  MAU Course/MDM: I have ordered labs as follows: CBC to assess amount of bleeding.  Hgb is unchanged from 2 mos ago Imaging ordered: none Results reviewed. Hemoglobin stable.   Consult Dr Elly Modena, who recommends Megace or POPs.  Pt states she wants to get pregnant. .Advised to use condoms consistently, as surgeons will not want her to be pregnant at her surgery   Treatments in MAU included none.   Pt stable at time of discharge.  Assessment: Abnormal Uterine bleeding Post coital bleeding No anemia  Plan: Discharge home Recommend condom use.  Follow up with surgeon Rx sent for Megace for AUB   Encouraged to return here or to other Urgent Care/ED if she develops worsening of symptoms, increase in pain, fever, or other concerning symptoms.   Hansel Feinstein CNM, MSN Certified Nurse-Midwife 06/21/2017 11:52 PM

## 2017-06-21 NOTE — MAU Note (Signed)
PT SAYS  LAST DEPO SHOT   WAS 09-2016-    GOT  AT  HD.     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   THEN STARTED VAG BLEEDING -  OFF/ ON - EVER SINCE.    WENT TO [facility]   LAST MTH- DID ANOTHER CT- TUMOR  STILL THERE .   [facility]   FOR  SURGERY ON 07-02-2017       NOW  HAS  ABD PAIN  AND  BLEEDING-  ALL WORSE  AFTER SEX-   .   PAD ON IN TRIAGE -   NOTHING.Marland Kitchen    PAIN MED- COLAZAPAM  FOR ANXIETY.

## 2017-06-22 DIAGNOSIS — N939 Abnormal uterine and vaginal bleeding, unspecified: Secondary | ICD-10-CM

## 2017-06-22 LAB — GC/CHLAMYDIA PROBE AMP (~~LOC~~) NOT AT ARMC
Chlamydia: NEGATIVE
NEISSERIA GONORRHEA: NEGATIVE

## 2017-06-22 LAB — WET PREP, GENITAL
SPERM: NONE SEEN
TRICH WET PREP: NONE SEEN
Yeast Wet Prep HPF POC: NONE SEEN

## 2017-06-22 MED ORDER — MEGESTROL ACETATE 40 MG PO TABS: 40.0000 mg | ORAL_TABLET | Freq: Every day | ORAL | 0 refills | Status: DC

## 2017-06-22 NOTE — Discharge Instructions (Signed)
Dysfunctional Uterine Bleeding °Dysfunctional uterine bleeding is abnormal bleeding from the uterus. Dysfunctional uterine bleeding includes: °· A period that comes earlier or later than usual. °· A period that is lighter, heavier, or has blood clots. °· Bleeding between periods. °· Skipping one or more periods. °· Bleeding after sexual intercourse. °· Bleeding after menopause. ° °Follow these instructions at home: °Pay attention to any changes in your symptoms. Follow these instructions to help with your condition: °Eating and drinking °· Eat well-balanced meals. Include foods that are high in iron, such as liver, meat, shellfish, green leafy vegetables, and eggs. °· If you become constipated: °? Drink plenty of water. °? Eat fruits and vegetables that are high in water and fiber, such as spinach, carrots, raspberries, apples, and mango. °Medicines °· Take over-the-counter and prescription medicines only as told by your health care provider. °· Do not change medicines without talking with your health care provider. °· Aspirin or medicines that contain aspirin may make the bleeding worse. Do not take those medicines: °? During the week before your period. °? During your period. °· If you were prescribed iron pills, take them as told by your health care provider. Iron pills help to replace iron that your body loses because of this condition. °Activity °· If you need to change your sanitary pad or tampon more than one time every 2 hours: °? Lie in bed with your feet raised (elevated). °? Place a cold pack on your lower abdomen. °? Rest as much as possible until the bleeding stops or slows down. °· Do not try to lose weight until the bleeding has stopped and your blood iron level is back to normal. °Other Instructions °· For two months, write down: °? When your period starts. °? When your period ends. °? When any abnormal bleeding occurs. °? What problems you notice. °· Keep all follow up visits as told by your health  care provider. This is important. °Contact a health care provider if: °· You get light-headed or weak. °· You have nausea and vomiting. °· You cannot eat or drink without vomiting. °· You feel dizzy or have diarrhea while you are taking medicines. °· You are taking birth control pills or hormones, and you want to change them or stop taking them. °Get help right away if: °· You develop a fever or chills. °· You need to change your sanitary pad or tampon more than one time per hour. °· Your bleeding becomes heavier, or your flow contains clots more often. °· You develop pain in your abdomen. °· You lose consciousness. °· You develop a rash. °This information is not intended to replace advice given to you by your health care provider. Make sure you discuss any questions you have with your health care provider. °Document Released: 02/11/2000 Document Revised: 07/22/2015 Document Reviewed: 05/11/2014 °Elsevier Interactive Patient Education © 2018 Elsevier Inc. ° °

## 2017-07-02 ENCOUNTER — Ambulatory Visit: Payer: Self-pay | Admitting: General Surgery

## 2017-07-02 NOTE — H&P (Signed)
History of Present Illness Katie Ruff MD; 04/29/2949 10:52 AM) The patient is a 37 year old female who presents with a colonic mass. 37 year old female who presents to the office for evaluation of a colon mass seen on colonoscopy. She presented to the emergency department in late December 2018 with complaints of abdominal pain. CT scan showed a proximal sigmoid intussusception. Flexible sigmoidoscopy showed a large colon polyp. Biopsy showed tubulovillous adenoma. She then underwent a full colonoscopy in January which again showed a large partially obstructing colon mass. She also had a distal rectal mass/polyp which was biopsied. This showed lymphoid aggregate. The mass was tattooed. She is currently asymptomatic. She has no surgical history.   Past Surgical History Mammie Lorenzo, LPN; 10/04/4164 06:30 AM) No pertinent past surgical history  Diagnostic Studies History Mammie Lorenzo, LPN; 03/05/107 32:35 AM) Colonoscopy within last year Mammogram never Pap Smear 1-5 years ago  Allergies Mammie Lorenzo, LPN; 07/04/3218 25:42 AM) No Known Allergies [07/02/2017]:  Medication History Mammie Lorenzo, LPN; 7/0/6237 62:83 AM) ClonazePAM (0.5MG  Tablet, Oral) Active. Megestrol Acetate (40MG  Tablet, Oral) Active.  Social History Mammie Lorenzo, LPN; 03/04/1759 60:73 AM) Alcohol use Occasional alcohol use. Caffeine use Carbonated beverages. No drug use Tobacco use Current some day smoker.  Family History Mammie Lorenzo, LPN; 08/27/624 94:85 AM) Colon Cancer Father. Family history unknown First Degree Relatives  Pregnancy / Birth History Mammie Lorenzo, LPN; 06/02/2701 50:09 AM) Age at menarche 36 years. Contraceptive History Depo-provera. Gravida 6 Maternal age 53-20 Para 5 Regular periods  Other Problems Mammie Lorenzo, LPN; 05/05/1827 93:71 AM) No pertinent past medical history     Review of Systems Mammie Lorenzo LPN; 08/06/6787 38:10 AM) General Present-  Night Sweats. Not Present- Appetite Loss, Chills, Fatigue, Fever, Weight Gain and Weight Loss. Skin Not Present- Change in Wart/Mole, Dryness, Hives, Jaundice, New Lesions, Non-Healing Wounds, Rash and Ulcer. HEENT Not Present- Earache, Hearing Loss, Hoarseness, Nose Bleed, Oral Ulcers, Ringing in the Ears, Seasonal Allergies, Sinus Pain, Sore Throat, Visual Disturbances, Wears glasses/contact lenses and Yellow Eyes. Respiratory Not Present- Bloody sputum, Chronic Cough, Difficulty Breathing, Snoring and Wheezing. Cardiovascular Present- Swelling of Extremities. Not Present- Chest Pain, Difficulty Breathing Lying Down, Leg Cramps, Palpitations, Rapid Heart Rate and Shortness of Breath. Gastrointestinal Not Present- Abdominal Pain, Bloating, Bloody Stool, Change in Bowel Habits, Chronic diarrhea, Constipation, Difficulty Swallowing, Excessive gas, Gets full quickly at meals, Hemorrhoids, Indigestion, Nausea, Rectal Pain and Vomiting. Female Genitourinary Not Present- Frequency, Nocturia, Painful Urination, Pelvic Pain and Urgency. Musculoskeletal Present- Joint Stiffness. Not Present- Back Pain, Joint Pain, Muscle Pain, Muscle Weakness and Swelling of Extremities. Neurological Not Present- Decreased Memory, Fainting, Headaches, Numbness, Seizures, Tingling, Tremor, Trouble walking and Weakness. Psychiatric Present- Anxiety. Not Present- Bipolar, Change in Sleep Pattern, Depression, Fearful and Frequent crying. Endocrine Present- Hot flashes. Not Present- Cold Intolerance, Excessive Hunger, Hair Changes, Heat Intolerance and New Diabetes. Hematology Not Present- Blood Thinners, Easy Bruising, Excessive bleeding, Gland problems, HIV and Persistent Infections.  Vitals Claiborne Billings Dockery LPN; 03/05/5100 58:52 AM) 07/02/2017 10:33 AM Weight: 199.4 lb Height: 63in Body Surface Area: 1.93 m Body Mass Index: 35.32 kg/m  Temp.: 97.78F(Temporal)  Pulse: 74 (Regular)  BP: 122/74 (Sitting, Left Arm,  Standard)      Physical Exam Katie Ruff MD; 09/02/8240 10:53 AM)  General Mental Status-Alert. General Appearance-Not in acute distress. Build & Nutrition-Well nourished. Posture-Normal posture. Gait-Normal.  Head and Neck Head-normocephalic, atraumatic with no lesions or palpable masses. Trachea-midline.  Chest and Lung Exam Chest and lung exam reveals -on  auscultation, normal breath sounds, no adventitious sounds and normal vocal resonance.  Cardiovascular Cardiovascular examination reveals -normal heart sounds, regular rate and rhythm with no murmurs and no digital clubbing, cyanosis, edema, increased warmth or tenderness.  Abdomen Inspection Inspection of the abdomen reveals - No Hernias. Palpation/Percussion Palpation and Percussion of the abdomen reveal - Soft, Non Tender, No Rigidity (guarding), No hepatosplenomegaly and No Palpable abdominal masses.  Neurologic Neurologic evaluation reveals -alert and oriented x 3 with no impairment of recent or remote memory, normal attention span and ability to concentrate, normal sensation and normal coordination.  Musculoskeletal Normal Exam - Bilateral-Upper Extremity Strength Normal and Lower Extremity Strength Normal.    Assessment & Plan Katie Ruff MD; 07/04/2618 10:49 AM)  MASS OF COLON (K63.9) Impression: 37 year old female who presents to the office with a mass found on colonoscopy. This was initially seen on CT scan in December. Colonoscopy performed in January showed a mass approximate 40 cm from anus which was polypoid in nature and partially obstructive. Biopsy showed tubulovillous adenoma. CT scan showed no sign of metastatic disease. CEA level has not been done. She is currently asymptomatic. I have recommended a partial colectomy performed minimally invasively. The surgery and anatomy were described to the patient as well as the risks of surgery and the possible complications. These  include: Bleeding, deep abdominal infections and possible wound complications such as hernia and infection, damage to adjacent structures, leak of surgical connections, which can lead to other surgeries and possibly an ostomy, possible need for other procedures, such as abscess drains in radiology, possible prolonged hospital stay, possible diarrhea from removal of part of the colon, possible constipation from narcotics, possible bowel, bladder or sexual dysfunction if having rectal surgery, prolonged fatigue/weakness or appetite loss, possible early recurrence of of disease, possible complications of their medical problems such as heart disease or arrhythmias or lung problems, death (less than 1%). I believe the patient understands and wishes to proceed with the surgery.

## 2017-07-02 NOTE — H&P (View-Only) (Signed)
History of Present Illness Leighton Ruff MD; 10/03/7670 10:52 AM) The patient is a 37 year old female who presents with a colonic mass. 37 year old female who presents to the office for evaluation of a colon mass seen on colonoscopy. She presented to the emergency department in late December 2018 with complaints of abdominal pain. CT scan showed a proximal sigmoid intussusception. Flexible sigmoidoscopy showed a large colon polyp. Biopsy showed tubulovillous adenoma. She then underwent a full colonoscopy in January which again showed a large partially obstructing colon mass. She also had a distal rectal mass/polyp which was biopsied. This showed lymphoid aggregate. The mass was tattooed. She is currently asymptomatic. She has no surgical history.   Past Surgical History Mammie Lorenzo, LPN; 0/10/4707 62:83 AM) No pertinent past surgical history  Diagnostic Studies History Mammie Lorenzo, LPN; 08/02/2945 65:46 AM) Colonoscopy within last year Mammogram never Pap Smear 1-5 years ago  Allergies Mammie Lorenzo, LPN; 5/0/3546 56:81 AM) No Known Allergies [07/02/2017]:  Medication History Mammie Lorenzo, LPN; 04/05/5168 01:74 AM) ClonazePAM (0.5MG  Tablet, Oral) Active. Megestrol Acetate (40MG  Tablet, Oral) Active.  Social History Mammie Lorenzo, LPN; 10/31/4965 59:16 AM) Alcohol use Occasional alcohol use. Caffeine use Carbonated beverages. No drug use Tobacco use Current some day smoker.  Family History Mammie Lorenzo, LPN; 05/05/4663 99:35 AM) Colon Cancer Father. Family history unknown First Degree Relatives  Pregnancy / Birth History Mammie Lorenzo, LPN; 7/0/1779 39:03 AM) Age at menarche 34 years. Contraceptive History Depo-provera. Gravida 6 Maternal age 14-20 Para 5 Regular periods  Other Problems Mammie Lorenzo, LPN; 0/0/9233 00:76 AM) No pertinent past medical history     Review of Systems Mammie Lorenzo LPN; 04/01/6331 54:56 AM) General Present-  Night Sweats. Not Present- Appetite Loss, Chills, Fatigue, Fever, Weight Gain and Weight Loss. Skin Not Present- Change in Wart/Mole, Dryness, Hives, Jaundice, New Lesions, Non-Healing Wounds, Rash and Ulcer. HEENT Not Present- Earache, Hearing Loss, Hoarseness, Nose Bleed, Oral Ulcers, Ringing in the Ears, Seasonal Allergies, Sinus Pain, Sore Throat, Visual Disturbances, Wears glasses/contact lenses and Yellow Eyes. Respiratory Not Present- Bloody sputum, Chronic Cough, Difficulty Breathing, Snoring and Wheezing. Cardiovascular Present- Swelling of Extremities. Not Present- Chest Pain, Difficulty Breathing Lying Down, Leg Cramps, Palpitations, Rapid Heart Rate and Shortness of Breath. Gastrointestinal Not Present- Abdominal Pain, Bloating, Bloody Stool, Change in Bowel Habits, Chronic diarrhea, Constipation, Difficulty Swallowing, Excessive gas, Gets full quickly at meals, Hemorrhoids, Indigestion, Nausea, Rectal Pain and Vomiting. Female Genitourinary Not Present- Frequency, Nocturia, Painful Urination, Pelvic Pain and Urgency. Musculoskeletal Present- Joint Stiffness. Not Present- Back Pain, Joint Pain, Muscle Pain, Muscle Weakness and Swelling of Extremities. Neurological Not Present- Decreased Memory, Fainting, Headaches, Numbness, Seizures, Tingling, Tremor, Trouble walking and Weakness. Psychiatric Present- Anxiety. Not Present- Bipolar, Change in Sleep Pattern, Depression, Fearful and Frequent crying. Endocrine Present- Hot flashes. Not Present- Cold Intolerance, Excessive Hunger, Hair Changes, Heat Intolerance and New Diabetes. Hematology Not Present- Blood Thinners, Easy Bruising, Excessive bleeding, Gland problems, HIV and Persistent Infections.  Vitals Claiborne Billings Dockery LPN; 04/04/6387 37:34 AM) 07/02/2017 10:33 AM Weight: 199.4 lb Height: 63in Body Surface Area: 1.93 m Body Mass Index: 35.32 kg/m  Temp.: 97.51F(Temporal)  Pulse: 74 (Regular)  BP: 122/74 (Sitting, Left Arm,  Standard)      Physical Exam Leighton Ruff MD; 04/07/7679 10:53 AM)  General Mental Status-Alert. General Appearance-Not in acute distress. Build & Nutrition-Well nourished. Posture-Normal posture. Gait-Normal.  Head and Neck Head-normocephalic, atraumatic with no lesions or palpable masses. Trachea-midline.  Chest and Lung Exam Chest and lung exam reveals -on  auscultation, normal breath sounds, no adventitious sounds and normal vocal resonance.  Cardiovascular Cardiovascular examination reveals -normal heart sounds, regular rate and rhythm with no murmurs and no digital clubbing, cyanosis, edema, increased warmth or tenderness.  Abdomen Inspection Inspection of the abdomen reveals - No Hernias. Palpation/Percussion Palpation and Percussion of the abdomen reveal - Soft, Non Tender, No Rigidity (guarding), No hepatosplenomegaly and No Palpable abdominal masses.  Neurologic Neurologic evaluation reveals -alert and oriented x 3 with no impairment of recent or remote memory, normal attention span and ability to concentrate, normal sensation and normal coordination.  Musculoskeletal Normal Exam - Bilateral-Upper Extremity Strength Normal and Lower Extremity Strength Normal.    Assessment & Plan Leighton Ruff MD; 09/30/3823 10:49 AM)  MASS OF COLON (K63.9) Impression: 37 year old female who presents to the office with a mass found on colonoscopy. This was initially seen on CT scan in December. Colonoscopy performed in January showed a mass approximate 40 cm from anus which was polypoid in nature and partially obstructive. Biopsy showed tubulovillous adenoma. CT scan showed no sign of metastatic disease. CEA level has not been done. She is currently asymptomatic. I have recommended a partial colectomy performed minimally invasively. The surgery and anatomy were described to the patient as well as the risks of surgery and the possible complications. These  include: Bleeding, deep abdominal infections and possible wound complications such as hernia and infection, damage to adjacent structures, leak of surgical connections, which can lead to other surgeries and possibly an ostomy, possible need for other procedures, such as abscess drains in radiology, possible prolonged hospital stay, possible diarrhea from removal of part of the colon, possible constipation from narcotics, possible bowel, bladder or sexual dysfunction if having rectal surgery, prolonged fatigue/weakness or appetite loss, possible early recurrence of of disease, possible complications of their medical problems such as heart disease or arrhythmias or lung problems, death (less than 1%). I believe the patient understands and wishes to proceed with the surgery.

## 2017-07-24 ENCOUNTER — Other Ambulatory Visit: Payer: Self-pay

## 2017-07-24 ENCOUNTER — Encounter (HOSPITAL_COMMUNITY)
Admission: RE | Admit: 2017-07-24 | Discharge: 2017-07-24 | Disposition: A | Discharge status: Home / Self Care | Payer: Self-pay | Source: Ambulatory Visit | Attending: General Surgery | Admitting: General Surgery

## 2017-07-24 ENCOUNTER — Encounter (HOSPITAL_COMMUNITY): Payer: Self-pay

## 2017-07-24 DIAGNOSIS — K639 Disease of intestine, unspecified: Secondary | ICD-10-CM | POA: Insufficient documentation

## 2017-07-24 DIAGNOSIS — Z01812 Encounter for preprocedural laboratory examination: Secondary | ICD-10-CM | POA: Insufficient documentation

## 2017-07-24 LAB — CBC
HEMATOCRIT: 34.8 % — AB (ref 36.0–46.0)
HEMOGLOBIN: 11.3 g/dL — AB (ref 12.0–15.0)
MCH: 29.4 pg (ref 26.0–34.0)
MCHC: 32.5 g/dL (ref 30.0–36.0)
MCV: 90.6 fL (ref 78.0–100.0)
Platelets: 320 10*3/uL (ref 150–400)
RBC: 3.84 MIL/uL — ABNORMAL LOW (ref 3.87–5.11)
RDW: 13.5 % (ref 11.5–15.5)
WBC: 6.2 10*3/uL (ref 4.0–10.5)

## 2017-07-24 LAB — BASIC METABOLIC PANEL
Anion gap: 9 (ref 5–15)
BUN: 15 mg/dL (ref 6–20)
CHLORIDE: 106 mmol/L (ref 101–111)
CO2: 23 mmol/L (ref 22–32)
Calcium: 9.1 mg/dL (ref 8.9–10.3)
Creatinine, Ser: 0.94 mg/dL (ref 0.44–1.00)
GFR calc Af Amer: >60 mL/min (ref >60)
GFR calc non Af Amer: >60 mL/min (ref >60)
GLUCOSE: 99 mg/dL (ref 65–99)
POTASSIUM: 3.8 mmol/L (ref 3.5–5.1)
Sodium: 138 mmol/L (ref 135–145)

## 2017-07-24 LAB — PREGNANCY, URINE: Preg Test, Ur: NEGATIVE

## 2017-07-24 LAB — ABO/RH: ABO/RH(D): A POS

## 2017-07-24 MED ORDER — BUPIVACAINE LIPOSOME 1.3 % IJ SUSP
20.0000 mL | INTRAMUSCULAR | Status: DC
  Filled 2017-07-24: qty 20

## 2017-07-24 NOTE — Patient Instructions (Addendum)
Katie Reed  07/24/2017   Your procedure is scheduled on: 07-25-17   Report to [facility] Main  Entrance    Report to Admitting at 10:00 AM    Call this number if you have problems the morning of surgery [phone]   Remember:Eat a light diet the day before surgery.  Examples including soups, broths, toast, yogurt, mashed potatoes.  Things to avoid include carbonated beverages (fizzy beverages), raw fruits and raw vegetables, or beans.   If your bowels are filled with gas, your surgeon will have difficulty visualizing your pelvic organs which increases your surgical risks.  DRINK 2 PRESURGERY ENSURE DRINKS THE NIGHT BEFORE SURGERY AT 1000 PM AND 1 PRESURGERY DRINK THE DAY OF THE PROCEDURE 3 HOURS PRIOR TO SCHEDULED SURGERY. NO SOLIDS AFTER MIDNIGHT THE DAY PRIOR TO THE SURGERY. NOTHING BY MOUTH EXCEPT CLEAR LIQUIDS UNTIL THREE HOURS PRIOR TO SCHEDULED SURGERY. PLEASE FINISH PRESURGERY ENSURE DRINK PER SURGEON ORDER 3 HOURS PRIOR TO SCHEDULED SURGERY TIME WHICH NEEDS TO BE COMPLETED AT 9:00 AM_    CLEAR LIQUID DIET   Foods Allowed                                                                     Foods Excluded  Coffee and tea, regular and decaf                             liquids that you cannot  Plain Jell-O in any flavor                                             see through such as: Fruit ices (not with fruit pulp)                                     milk, soups, orange juice  Iced Popsicles                                    All solid food Carbonated beverages, regular and diet                                    Cranberry, grape and apple juices Sports drinks like Gatorade Lightly seasoned clear broth or consume(fat free) Sugar, honey syrup  Sample Menu Breakfast                                Lunch                                     Supper Cranberry juice  Beef broth                            Chicken broth Jell-O                                      Grape juice                           Apple juice Coffee or tea                        Jell-O                                      Popsicle                                                Coffee or tea                        Coffee or tea  _____________________________________________________________________    Take these medicines the morning of surgery with A SIP OF WATER: None-per pt's preference                                You may not have any metal on your body including hair pins and              piercings  Do not wear jewelry, make-up, lotions, powders or perfumes, deodorant             Do not wear nail polish.  Do not shave  48 hours prior to surgery.              Do not bring valuables to the hospital. [place].  Contacts, dentures or bridgework may not be worn into surgery.  Leave suitcase in the car. After surgery it may be brought to your room.      Special Instructions: Follow your diet prep per your MD's instructions              Please read over the following fact sheets you were given: _____________________________________________________________________           [facility] - Preparing for Surgery Before surgery, you can play an important role.  Because skin is not sterile, your skin needs to be as free of germs as possible.  You can reduce the number of germs on your skin by washing with CHG (chlorahexidine gluconate) soap before surgery.  CHG is an antiseptic cleaner which kills germs and bonds with the skin to continue killing germs even after washing. Please DO NOT use if you have an allergy to CHG or antibacterial soaps.  If your skin becomes reddened/irritated stop using the CHG and inform your nurse when you arrive at Short Stay. Do not shave (including legs and underarms) for at least 48 hours prior to the first CHG shower.  You may shave  your face/neck. Please follow these  instructions carefully:  1.  Shower with CHG Soap the night before surgery and the  morning of Surgery.  2.  If you choose to wash your hair, wash your hair first as usual with your  normal  shampoo.  3.  After you shampoo, rinse your hair and body thoroughly to remove the  shampoo.                           4.  Use CHG as you would any other liquid soap.  You can apply chg directly  to the skin and wash                       Gently with a scrungie or clean washcloth.  5.  Apply the CHG Soap to your body ONLY FROM THE NECK DOWN.   Do not use on face/ open                           Wound or open sores. Avoid contact with eyes, ears mouth and genitals (private parts).                       Wash face,  Genitals (private parts) with your normal soap.             6.  Wash thoroughly, paying special attention to the area where your surgery  will be performed.  7.  Thoroughly rinse your body with warm water from the neck down.  8.  DO NOT shower/wash with your normal soap after using and rinsing off  the CHG Soap.                9.  Pat yourself dry with a clean towel.            10.  Wear clean pajamas.            11.  Place clean sheets on your bed the night of your first shower and do not  sleep with pets. Day of Surgery : Do not apply any lotions/deodorants the morning of surgery.  Please wear clean clothes to the hospital/surgery center.  FAILURE TO FOLLOW THESE INSTRUCTIONS MAY RESULT IN THE CANCELLATION OF YOUR SURGERY PATIENT SIGNATURE_________________________________  NURSE SIGNATURE__________________________________  ________________________________________________________________________  WHAT IS A BLOOD TRANSFUSION? Blood Transfusion Information  A transfusion is the replacement of blood or some of its parts. Blood is made up of multiple cells which provide different functions.  Red blood cells carry oxygen and are used for blood loss replacement.  White blood cells fight against  infection.  Platelets control bleeding.  Plasma helps clot blood.  Other blood products are available for specialized needs, such as hemophilia or other clotting disorders. BEFORE THE TRANSFUSION  Who gives blood for transfusions?   Healthy volunteers who are fully evaluated to make sure their blood is safe. This is blood bank blood. Transfusion therapy is the safest it has ever been in the practice of medicine. Before blood is taken from a donor, a complete history is taken to make sure that person has no history of diseases nor engages in risky social behavior (examples are intravenous drug use or sexual activity with multiple partners). The donor's travel history is screened to minimize risk of transmitting infections, such as malaria. The donated blood is tested for signs of infectious diseases,  such as HIV and hepatitis. The blood is then tested to be sure it is compatible with you in order to minimize the chance of a transfusion reaction. If you or a relative donates blood, this is often done in anticipation of surgery and is not appropriate for emergency situations. It takes many days to process the donated blood. RISKS AND COMPLICATIONS Although transfusion therapy is very safe and saves many lives, the main dangers of transfusion include:   Getting an infectious disease.  Developing a transfusion reaction. This is an allergic reaction to something in the blood you were given. Every precaution is taken to prevent this. The decision to have a blood transfusion has been considered carefully by your caregiver before blood is given. Blood is not given unless the benefits outweigh the risks. AFTER THE TRANSFUSION  Right after receiving a blood transfusion, you will usually feel much better and more energetic. This is especially true if your red blood cells have gotten low (anemic). The transfusion raises the level of the red blood cells which carry oxygen, and this usually causes an energy  increase.  The nurse administering the transfusion will monitor you carefully for complications. HOME CARE INSTRUCTIONS  No special instructions are needed after a transfusion. You may find your energy is better. Speak with your caregiver about any limitations on activity for underlying diseases you may have. SEEK MEDICAL CARE IF:   Your condition is not improving after your transfusion.  You develop redness or irritation at the intravenous (IV) site. SEEK IMMEDIATE MEDICAL CARE IF:  Any of the following symptoms occur over the next 12 hours:  Shaking chills.  You have a temperature by mouth above 102 F (38.9 C), not controlled by medicine.  Chest, back, or muscle pain.  People around you feel you are not acting correctly or are confused.  Shortness of breath or difficulty breathing.  Dizziness and fainting.  You get a rash or develop hives.  You have a decrease in urine output.  Your urine turns a dark color or changes to pink, red, or brown. Any of the following symptoms occur over the next 10 days:  You have a temperature by mouth above 102 F (38.9 C), not controlled by medicine.  Shortness of breath.  Weakness after normal activity.  The white part of the eye turns yellow (jaundice).  You have a decrease in the amount of urine or are urinating less often.  Your urine turns a dark color or changes to pink, red, or brown. Document Released: 02/11/2000 Document Revised: 05/08/2011 Document Reviewed: 09/30/2007 [facility] Patient Information 2014 [place], [place].  _______________________________________________________________________

## 2017-07-24 NOTE — Anesthesia Preprocedure Evaluation (Addendum)
Anesthesia Evaluation  Patient identified by MRN, date of birth, ID band Patient awake    Reviewed: Allergy & Precautions, H&P , NPO status , Patient's Chart, lab work & pertinent test results  Airway Mallampati: II  TM Distance: >3 FB Neck ROM: Full    Dental no notable dental hx. (+) Partial Upper, Dental Advisory Given   Pulmonary Current Smoker,    Pulmonary exam normal breath sounds clear to auscultation       Cardiovascular Exercise Tolerance: Good negative cardio ROS   Rhythm:Regular Rate:Normal     Neuro/Psych Anxiety Depression negative neurological ROS     GI/Hepatic negative GI ROS, Neg liver ROS,   Endo/Other  negative endocrine ROS  Renal/GU negative Renal ROS  negative genitourinary   Musculoskeletal   Abdominal   Peds  Hematology negative hematology ROS (+)   Anesthesia Other Findings   Reproductive/Obstetrics negative OB ROS                            Anesthesia Physical Anesthesia Plan  ASA: II  Anesthesia Plan: General   Post-op Pain Management:    Induction: Intravenous  PONV Risk Score and Plan: 3 and Ondansetron, Dexamethasone and Midazolam  Airway Management Planned: Oral ETT  Additional Equipment:   Intra-op Plan:   Post-operative Plan: Extubation in OR  Informed Consent: I have reviewed the patients History and Physical, chart, labs and discussed the procedure including the risks, benefits and alternatives for the proposed anesthesia with the patient or authorized representative who has indicated his/her understanding and acceptance.   Dental advisory given  Plan Discussed with: CRNA  Anesthesia Plan Comments:         Anesthesia Quick Evaluation

## 2017-07-25 ENCOUNTER — Encounter (HOSPITAL_COMMUNITY)
Admission: RE | Disposition: A | Discharge status: Home / Self Care | Payer: Self-pay | Source: Ambulatory Visit | Attending: General Surgery

## 2017-07-25 ENCOUNTER — Encounter (HOSPITAL_COMMUNITY): Payer: Self-pay | Admitting: Emergency Medicine

## 2017-07-25 ENCOUNTER — Inpatient Hospital Stay (HOSPITAL_COMMUNITY): Payer: Self-pay | Admitting: Anesthesiology

## 2017-07-25 ENCOUNTER — Inpatient Hospital Stay (HOSPITAL_COMMUNITY)
Admission: RE | Admit: 2017-07-25 | Discharge: 2017-07-27 | DRG: 331 | Disposition: A | Discharge status: Home / Self Care | Payer: Self-pay | Source: Ambulatory Visit | Attending: General Surgery | Admitting: General Surgery

## 2017-07-25 DIAGNOSIS — F419 Anxiety disorder, unspecified: Secondary | ICD-10-CM | POA: Diagnosis present

## 2017-07-25 DIAGNOSIS — K6389 Other specified diseases of intestine: Secondary | ICD-10-CM | POA: Diagnosis present

## 2017-07-25 DIAGNOSIS — Z800 Family history of malignant neoplasm of digestive organs: Secondary | ICD-10-CM

## 2017-07-25 DIAGNOSIS — Z79899 Other long term (current) drug therapy: Secondary | ICD-10-CM

## 2017-07-25 DIAGNOSIS — Z793 Long term (current) use of hormonal contraceptives: Secondary | ICD-10-CM

## 2017-07-25 DIAGNOSIS — C189 Malignant neoplasm of colon, unspecified: Principal | ICD-10-CM | POA: Diagnosis present

## 2017-07-25 DIAGNOSIS — F329 Major depressive disorder, single episode, unspecified: Secondary | ICD-10-CM | POA: Diagnosis present

## 2017-07-25 HISTORY — DX: Other specified diseases of intestine: K63.89

## 2017-07-25 LAB — TYPE AND SCREEN
ABO/RH(D): A POS
Antibody Screen: NEGATIVE

## 2017-07-25 LAB — CEA: CEA: 6.1 ng/mL — ABNORMAL HIGH (ref 0.0–4.7)

## 2017-07-25 SURGERY — COLECTOMY, PARTIAL, ROBOT-ASSISTED, LAPAROSCOPIC
Anesthesia: General | Site: Abdomen

## 2017-07-25 MED ORDER — 0.9 % SODIUM CHLORIDE (POUR BTL) OPTIME
TOPICAL | Status: DC | PRN
  Administered 2017-07-25: 2000 mL

## 2017-07-25 MED ORDER — SUGAMMADEX SODIUM 200 MG/2ML IV SOLN
INTRAVENOUS | Status: AC
  Filled 2017-07-25: qty 2

## 2017-07-25 MED ORDER — ONDANSETRON HCL 4 MG PO TABS: 4.0000 mg | ORAL_TABLET | Freq: Four times a day (QID) | ORAL | Status: DC | PRN

## 2017-07-25 MED ORDER — LIDOCAINE 2% (20 MG/ML) 5 ML SYRINGE
INTRAMUSCULAR | Status: DC | PRN
  Administered 2017-07-25: 1.5 mg/kg/h via INTRAVENOUS

## 2017-07-25 MED ORDER — KCL IN DEXTROSE-NACL 20-5-0.45 MEQ/L-%-% IV SOLN
INTRAVENOUS | Status: DC
  Administered 2017-07-25: 16:00:00 via INTRAVENOUS
  Filled 2017-07-25: qty 1000

## 2017-07-25 MED ORDER — ONDANSETRON HCL 4 MG/2ML IJ SOLN
INTRAMUSCULAR | Status: AC
  Filled 2017-07-25: qty 2

## 2017-07-25 MED ORDER — KETAMINE HCL 10 MG/ML IJ SOLN
INTRAMUSCULAR | Status: DC | PRN
  Administered 2017-07-25: 25 mg via INTRAVENOUS
  Administered 2017-07-25: 10 mg via INTRAVENOUS
  Administered 2017-07-25: 5 mg via INTRAVENOUS

## 2017-07-25 MED ORDER — SUGAMMADEX SODIUM 500 MG/5ML IV SOLN
INTRAVENOUS | Status: AC
  Filled 2017-07-25: qty 5

## 2017-07-25 MED ORDER — KETAMINE HCL 10 MG/ML IJ SOLN
INTRAMUSCULAR | Status: AC
  Filled 2017-07-25: qty 1

## 2017-07-25 MED ORDER — BUPIVACAINE-EPINEPHRINE (PF) 0.5% -1:200000 IJ SOLN
INTRAMUSCULAR | Status: DC | PRN
  Administered 2017-07-25: 50 mL via PERINEURAL

## 2017-07-25 MED ORDER — ACETAMINOPHEN 500 MG PO TABS
1000.0000 mg | ORAL_TABLET | Freq: Four times a day (QID) | ORAL | Status: DC
  Administered 2017-07-25 – 2017-07-27 (×8): 1000 mg via ORAL
  Filled 2017-07-25 (×8): qty 2

## 2017-07-25 MED ORDER — DEXAMETHASONE SODIUM PHOSPHATE 4 MG/ML IJ SOLN
INTRAMUSCULAR | Status: DC | PRN
  Administered 2017-07-25: 10 mg via INTRAVENOUS

## 2017-07-25 MED ORDER — ONDANSETRON HCL 4 MG/2ML IJ SOLN: 4.0000 mg | Freq: Four times a day (QID) | INTRAMUSCULAR | Status: DC | PRN

## 2017-07-25 MED ORDER — LACTATED RINGERS IV SOLN
INTRAVENOUS | Status: DC
  Administered 2017-07-25: 09:00:00 via INTRAVENOUS

## 2017-07-25 MED ORDER — SUGAMMADEX SODIUM 200 MG/2ML IV SOLN
INTRAVENOUS | Status: DC | PRN
  Administered 2017-07-25: 200 mg via INTRAVENOUS

## 2017-07-25 MED ORDER — ONDANSETRON HCL 4 MG/2ML IJ SOLN
INTRAMUSCULAR | Status: DC | PRN
  Administered 2017-07-25: 4 mg via INTRAVENOUS

## 2017-07-25 MED ORDER — HYDROMORPHONE HCL 1 MG/ML IJ SOLN
0.2500 mg | INTRAMUSCULAR | Status: DC | PRN
  Administered 2017-07-25 (×2): 0.5 mg via INTRAVENOUS

## 2017-07-25 MED ORDER — MIDAZOLAM HCL 5 MG/5ML IJ SOLN
INTRAMUSCULAR | Status: DC | PRN
  Administered 2017-07-25: 2 mg via INTRAVENOUS

## 2017-07-25 MED ORDER — HYDROMORPHONE HCL 1 MG/ML IJ SOLN
INTRAMUSCULAR | Status: AC
  Filled 2017-07-25: qty 1

## 2017-07-25 MED ORDER — FENTANYL CITRATE (PF) 250 MCG/5ML IJ SOLN
INTRAMUSCULAR | Status: AC
  Filled 2017-07-25: qty 5

## 2017-07-25 MED ORDER — LIDOCAINE 2% (20 MG/ML) 5 ML SYRINGE
INTRAMUSCULAR | Status: DC | PRN
  Administered 2017-07-25: 100 mg via INTRAVENOUS

## 2017-07-25 MED ORDER — LIDOCAINE HCL 2 % IJ SOLN
INTRAMUSCULAR | Status: AC
  Filled 2017-07-25: qty 20

## 2017-07-25 MED ORDER — ROCURONIUM BROMIDE 50 MG/5ML IV SOSY
PREFILLED_SYRINGE | INTRAVENOUS | Status: DC | PRN
  Administered 2017-07-25 (×2): 10 mg via INTRAVENOUS
  Administered 2017-07-25: 60 mg via INTRAVENOUS

## 2017-07-25 MED ORDER — LIDOCAINE 2% (20 MG/ML) 5 ML SYRINGE
INTRAMUSCULAR | Status: AC
  Filled 2017-07-25: qty 5

## 2017-07-25 MED ORDER — HEPARIN SODIUM (PORCINE) 5000 UNIT/ML IJ SOLN
5000.0000 [IU] | Freq: Once | INTRAMUSCULAR | Status: AC
  Administered 2017-07-25: 5000 [IU] via SUBCUTANEOUS
  Filled 2017-07-25: qty 1

## 2017-07-25 MED ORDER — ROCURONIUM BROMIDE 10 MG/ML (PF) SYRINGE
PREFILLED_SYRINGE | INTRAVENOUS | Status: AC
  Filled 2017-07-25: qty 5

## 2017-07-25 MED ORDER — RINGERS IRRIGATION IR SOLN
Status: DC | PRN
  Administered 2017-07-25: 1000 mL

## 2017-07-25 MED ORDER — ALVIMOPAN 12 MG PO CAPS
12.0000 mg | ORAL_CAPSULE | Freq: Two times a day (BID) | ORAL | Status: DC
  Administered 2017-07-26 (×2): 12 mg via ORAL
  Filled 2017-07-25 (×2): qty 1

## 2017-07-25 MED ORDER — DIPHENHYDRAMINE HCL 50 MG/ML IJ SOLN: 25.0000 mg | Freq: Four times a day (QID) | INTRAMUSCULAR | Status: DC | PRN

## 2017-07-25 MED ORDER — ALVIMOPAN 12 MG PO CAPS
12.0000 mg | ORAL_CAPSULE | ORAL | Status: AC
  Administered 2017-07-25: 12 mg via ORAL
  Filled 2017-07-25: qty 1

## 2017-07-25 MED ORDER — ENOXAPARIN SODIUM 40 MG/0.4ML ~~LOC~~ SOLN
40.0000 mg | SUBCUTANEOUS | Status: DC
  Administered 2017-07-26 – 2017-07-27 (×2): 40 mg via SUBCUTANEOUS
  Filled 2017-07-25 (×2): qty 0.4

## 2017-07-25 MED ORDER — BUPIVACAINE-EPINEPHRINE 0.5% -1:200000 IJ SOLN
INTRAMUSCULAR | Status: AC
  Filled 2017-07-25: qty 1

## 2017-07-25 MED ORDER — MIDAZOLAM HCL 2 MG/2ML IJ SOLN
INTRAMUSCULAR | Status: AC
  Filled 2017-07-25: qty 2

## 2017-07-25 MED ORDER — SCOPOLAMINE 1 MG/3DAYS TD PT72: MEDICATED_PATCH | TRANSDERMAL | Status: DC | PRN

## 2017-07-25 MED ORDER — LACTATED RINGERS IV SOLN
INTRAVENOUS | Status: DC | PRN
  Administered 2017-07-25: 11:00:00 via INTRAVENOUS

## 2017-07-25 MED ORDER — CEFOTETAN DISODIUM 2 G IJ SOLR
2.0000 g | INTRAMUSCULAR | Status: AC
  Administered 2017-07-25: 2 g via INTRAVENOUS
  Filled 2017-07-25: qty 2

## 2017-07-25 MED ORDER — BUPIVACAINE LIPOSOME 1.3 % IJ SUSP
INTRAMUSCULAR | Status: DC | PRN
  Administered 2017-07-25: 20 mL

## 2017-07-25 MED ORDER — SACCHAROMYCES BOULARDII 250 MG PO CAPS
250.0000 mg | ORAL_CAPSULE | Freq: Two times a day (BID) | ORAL | Status: DC
  Administered 2017-07-25 – 2017-07-27 (×4): 250 mg via ORAL
  Filled 2017-07-25 (×4): qty 1

## 2017-07-25 MED ORDER — DEXAMETHASONE SODIUM PHOSPHATE 10 MG/ML IJ SOLN
INTRAMUSCULAR | Status: AC
  Filled 2017-07-25: qty 1

## 2017-07-25 MED ORDER — SCOPOLAMINE 1 MG/3DAYS TD PT72
MEDICATED_PATCH | TRANSDERMAL | Status: AC
  Filled 2017-07-25: qty 1

## 2017-07-25 MED ORDER — SODIUM CHLORIDE 0.9 % IV SOLN
2.0000 g | Freq: Two times a day (BID) | INTRAVENOUS | Status: AC
  Administered 2017-07-25: 2 g via INTRAVENOUS
  Filled 2017-07-25: qty 2

## 2017-07-25 MED ORDER — GABAPENTIN 300 MG PO CAPS
300.0000 mg | ORAL_CAPSULE | ORAL | Status: AC
Start: 2017-07-25 — End: 2017-07-25
  Administered 2017-07-25: 300 mg via ORAL
  Filled 2017-07-25: qty 1

## 2017-07-25 MED ORDER — PROPOFOL 10 MG/ML IV BOLUS
INTRAVENOUS | Status: AC
  Filled 2017-07-25: qty 20

## 2017-07-25 MED ORDER — CLONAZEPAM 0.5 MG PO TABS: 0.5000 mg | ORAL_TABLET | Freq: Two times a day (BID) | ORAL | Status: DC | PRN

## 2017-07-25 MED ORDER — SCOPOLAMINE 1 MG/3DAYS TD PT72
MEDICATED_PATCH | TRANSDERMAL | Status: DC | PRN
  Administered 2017-07-25: 1 via TRANSDERMAL

## 2017-07-25 MED ORDER — ACETAMINOPHEN 500 MG PO TABS
1000.0000 mg | ORAL_TABLET | ORAL | Status: AC
  Administered 2017-07-25: 1000 mg via ORAL
  Filled 2017-07-25: qty 2

## 2017-07-25 MED ORDER — ALUM & MAG HYDROXIDE-SIMETH 200-200-20 MG/5ML PO SUSP: 30.0000 mL | Freq: Four times a day (QID) | ORAL | Status: DC | PRN

## 2017-07-25 MED ORDER — PROPOFOL 10 MG/ML IV BOLUS
INTRAVENOUS | Status: DC | PRN
  Administered 2017-07-25: 200 mg via INTRAVENOUS

## 2017-07-25 MED ORDER — DIPHENHYDRAMINE HCL 25 MG PO CAPS: 25.0000 mg | ORAL_CAPSULE | Freq: Four times a day (QID) | ORAL | Status: DC | PRN

## 2017-07-25 MED ORDER — FENTANYL CITRATE (PF) 100 MCG/2ML IJ SOLN
INTRAMUSCULAR | Status: DC | PRN
  Administered 2017-07-25 (×3): 50 ug via INTRAVENOUS
  Administered 2017-07-25: 100 ug via INTRAVENOUS
  Administered 2017-07-25: 25 ug via INTRAVENOUS
  Administered 2017-07-25: 50 ug via INTRAVENOUS

## 2017-07-25 MED ORDER — HYDROMORPHONE HCL 1 MG/ML IJ SOLN
0.5000 mg | INTRAMUSCULAR | Status: DC | PRN
  Administered 2017-07-25 – 2017-07-26 (×6): 0.5 mg via INTRAVENOUS
  Filled 2017-07-25 (×6): qty 0.5

## 2017-07-25 SURGICAL SUPPLY — 95 items
BLADE EXTENDED COATED 6.5IN (ELECTRODE) IMPLANT
CANNULA REDUC XI 12-8 STAPL (CANNULA) ×1
CANNULA REDUC XI 12-8MM STAPL (CANNULA) ×1
CANNULA REDUCER 12-8 DVNC XI (CANNULA) ×1 IMPLANT
CELLS DAT CNTRL 66122 CELL SVR (MISCELLANEOUS) IMPLANT
CLIP VESOLOCK LG 6/CT PURPLE (CLIP) IMPLANT
CLIP VESOLOCK MED 6/CT (CLIP) IMPLANT
COVER SURGICAL LIGHT HANDLE (MISCELLANEOUS) ×6 IMPLANT
COVER TIP SHEARS 8 DVNC (MISCELLANEOUS) ×1 IMPLANT
COVER TIP SHEARS 8MM DA VINCI (MISCELLANEOUS) ×2
DECANTER SPIKE VIAL GLASS SM (MISCELLANEOUS) IMPLANT
DERMABOND ADVANCED (GAUZE/BANDAGES/DRESSINGS) ×2
DERMABOND ADVANCED .7 DNX12 (GAUZE/BANDAGES/DRESSINGS) ×1 IMPLANT
DRAIN CHANNEL 19F RND (DRAIN) IMPLANT
DRAPE ARM DVNC X/XI (DISPOSABLE) ×4 IMPLANT
DRAPE COLUMN DVNC XI (DISPOSABLE) ×1 IMPLANT
DRAPE DA VINCI XI ARM (DISPOSABLE) ×8
DRAPE DA VINCI XI COLUMN (DISPOSABLE) ×2
DRAPE SURG IRRIG POUCH 19X23 (DRAPES) ×3 IMPLANT
DRSG OPSITE POSTOP 3X4 (GAUZE/BANDAGES/DRESSINGS) ×3 IMPLANT
DRSG OPSITE POSTOP 4X10 (GAUZE/BANDAGES/DRESSINGS) IMPLANT
DRSG OPSITE POSTOP 4X6 (GAUZE/BANDAGES/DRESSINGS) IMPLANT
DRSG OPSITE POSTOP 4X8 (GAUZE/BANDAGES/DRESSINGS) IMPLANT
ELECT PENCIL ROCKER SW 15FT (MISCELLANEOUS) ×3 IMPLANT
ELECT REM PT RETURN 15FT ADLT (MISCELLANEOUS) ×3 IMPLANT
ENDOLOOP SUT PDS II  0 18 (SUTURE)
ENDOLOOP SUT PDS II 0 18 (SUTURE) IMPLANT
EVACUATOR SILICONE 100CC (DRAIN) IMPLANT
GAUZE SPONGE 4X4 12PLY STRL (GAUZE/BANDAGES/DRESSINGS) IMPLANT
GLOVE BIO SURGEON STRL SZ 6.5 (GLOVE) ×6 IMPLANT
GLOVE BIO SURGEONS STRL SZ 6.5 (GLOVE) ×3
GLOVE BIOGEL PI IND STRL 7.0 (GLOVE) ×3 IMPLANT
GLOVE BIOGEL PI INDICATOR 7.0 (GLOVE) ×6
GOWN STRL REUS W/TWL 2XL LVL3 (GOWN DISPOSABLE) ×9 IMPLANT
GOWN STRL REUS W/TWL XL LVL3 (GOWN DISPOSABLE) ×12 IMPLANT
GRASPER ENDOPATH ANVIL 10MM (MISCELLANEOUS) IMPLANT
GRASPER SUT TROCAR 14GX15 (MISCELLANEOUS) IMPLANT
HOLDER FOLEY CATH W/STRAP (MISCELLANEOUS) ×3 IMPLANT
IRRIG SUCT STRYKERFLOW 2 WTIP (MISCELLANEOUS) ×3
IRRIGATION SUCT STRKRFLW 2 WTP (MISCELLANEOUS) ×1 IMPLANT
IRRIGATOR SUCT 8 DISP DVNC XI (IRRIGATION / IRRIGATOR) IMPLANT
IRRIGATOR SUCTION 8MM XI DISP (IRRIGATION / IRRIGATOR)
KIT PROCEDURE DA VINCI SI (MISCELLANEOUS)
KIT PROCEDURE DVNC SI (MISCELLANEOUS) IMPLANT
NEEDLE INSUFFLATION 14GA 120MM (NEEDLE) ×3 IMPLANT
PACK CARDIOVASCULAR III (CUSTOM PROCEDURE TRAY) ×3 IMPLANT
PACK COLON (CUSTOM PROCEDURE TRAY) ×3 IMPLANT
PORT LAP GEL ALEXIS MED 5-9CM (MISCELLANEOUS) IMPLANT
RTRCTR WOUND ALEXIS 18CM MED (MISCELLANEOUS)
SCISSORS LAP 5X35 DISP (ENDOMECHANICALS) ×3 IMPLANT
SEAL CANN UNIV 5-8 DVNC XI (MISCELLANEOUS) ×3 IMPLANT
SEAL XI 5MM-8MM UNIVERSAL (MISCELLANEOUS) ×6
SEALER VESSEL DA VINCI XI (MISCELLANEOUS) ×2
SEALER VESSEL EXT DVNC XI (MISCELLANEOUS) ×1 IMPLANT
SLEEVE ADV FIXATION 5X100MM (TROCAR) IMPLANT
SOLUTION ELECTROLUBE (MISCELLANEOUS) ×3 IMPLANT
STAPLER 45 BLU RELOAD XI (STAPLE) ×2 IMPLANT
STAPLER 45 BLUE RELOAD XI (STAPLE) ×4
STAPLER 45 GREEN RELOAD XI (STAPLE)
STAPLER 45 GRN RELOAD XI (STAPLE) IMPLANT
STAPLER AUT SUT 4.8 EEAXL 31 (STAPLE) ×3 IMPLANT
STAPLER CANNULA SEAL DVNC XI (STAPLE) ×1 IMPLANT
STAPLER CANNULA SEAL XI (STAPLE) ×2
STAPLER SHEATH (SHEATH) ×2
STAPLER SHEATH ENDOWRIST DVNC (SHEATH) ×1 IMPLANT
STAPLER VISISTAT 35W (STAPLE) IMPLANT
SUT ETHILON 2 0 PS N (SUTURE) IMPLANT
SUT NOVA 1 T20/GS 25DT (SUTURE) ×9 IMPLANT
SUT NOVA NAB DX-16 0-1 5-0 T12 (SUTURE) ×6 IMPLANT
SUT PROLENE 2 0 KS (SUTURE) ×3 IMPLANT
SUT SILK 2 0 (SUTURE) ×2
SUT SILK 2 0 SH CR/8 (SUTURE) ×3 IMPLANT
SUT SILK 2-0 18XBRD TIE 12 (SUTURE) ×1 IMPLANT
SUT SILK 3 0 (SUTURE) ×2
SUT SILK 3 0 SH CR/8 (SUTURE) ×3 IMPLANT
SUT SILK 3-0 18XBRD TIE 12 (SUTURE) ×1 IMPLANT
SUT V-LOC BARB 180 2/0GR6 GS22 (SUTURE)
SUT VIC AB 2-0 SH 18 (SUTURE) ×3 IMPLANT
SUT VIC AB 2-0 SH 27 (SUTURE) ×4
SUT VIC AB 2-0 SH 27X BRD (SUTURE) ×2 IMPLANT
SUT VIC AB 3-0 SH 18 (SUTURE) ×3 IMPLANT
SUT VIC AB 4-0 PS2 18 (SUTURE) ×6 IMPLANT
SUT VIC AB 4-0 PS2 27 (SUTURE) ×6 IMPLANT
SUT VICRYL 0 UR6 27IN ABS (SUTURE) ×3 IMPLANT
SUTURE V-LC BRB 180 2/0GR6GS22 (SUTURE) IMPLANT
SYR 10ML ECCENTRIC (SYRINGE) ×3 IMPLANT
SYS LAPSCP GELPORT 120MM (MISCELLANEOUS)
SYSTEM LAPSCP GELPORT 120MM (MISCELLANEOUS) IMPLANT
TOWEL OR 17X26 10 PK STRL BLUE (TOWEL DISPOSABLE) IMPLANT
TOWEL OR NON WOVEN STRL DISP B (DISPOSABLE) ×3 IMPLANT
TRAY FOLEY MTR SLVR 16FR STAT (SET/KITS/TRAYS/PACK) ×3 IMPLANT
TROCAR ADV FIXATION 5X100MM (TROCAR) ×3 IMPLANT
TUBING CONNECTING 10 (TUBING) ×4 IMPLANT
TUBING CONNECTING 10' (TUBING) ×2
TUBING INSUFFLATION 10FT LAP (TUBING) ×3 IMPLANT

## 2017-07-25 NOTE — Transfer of Care (Signed)
Immediate Anesthesia Transfer of Care Note  Patient: Katie Reed  Procedure(s) Performed: XI ROBOT ASSISTED SIGMOID  COLECTOMY ERAS PATHWAY (N/A Abdomen)  Patient Location: PACU  Anesthesia Type:General  Level of Consciousness: sedated  Airway & Oxygen Therapy: Patient Spontanous Breathing and Patient connected to face mask oxygen  Post-op Assessment: Report given to RN and Post -op Vital signs reviewed and stable  Post vital signs: Reviewed and stable  Last Vitals:  Vitals Value Taken Time  BP 125/88 07/25/2017  1:06 PM  Temp    Pulse 83 07/25/2017  1:09 PM  Resp 11 07/25/2017  1:09 PM  SpO2 100 % 07/25/2017  1:09 PM  Vitals shown include unvalidated device data.  Last Pain:  Vitals:   07/25/17 0902  TempSrc:   PainSc: 0-No pain      Patients Stated Pain Goal: 4 (40/98/11 9147)  Complications: No apparent anesthesia complications

## 2017-07-25 NOTE — Op Note (Signed)
07/25/2017  12:55 PM  PATIENT:  Katie Reed  37 y.o. female  Patient Care Team: Patient, No Pcp Per as PCP - General (General Practice)  PRE-OPERATIVE DIAGNOSIS:  Colon tumor  POST-OPERATIVE DIAGNOSIS:  Colon tumor  PROCEDURE:  Procedure(s): XI ROBOT ASSISTED SIGMOIDECTOMY    Surgeon(s): Leighton Ruff, MD Ileana Roup, MD  ASSISTANT: Dr Dema Severin   ANESTHESIA:   local and general  EBL: 32ml  Total I/O In: 1100 [I.V.:1100] Out: 125 [Urine:100; Blood:25]  Delay start of Pharmacological VTE agent (>24hrs) due to surgical blood loss or risk of bleeding:  no  DRAINS: none   SPECIMEN:  Source of Specimen:  Sigmoid colon  DISPOSITION OF SPECIMEN:  PATHOLOGY  COUNTS:  YES  PLAN OF CARE: Admit to inpatient   PATIENT DISPOSITION:  PACU - hemodynamically stable.  INDICATION:    37 y.o. F with an endoscopically unresectable sigmoid colon polyp.  I recommended segmental resection:  The anatomy & physiology of the digestive tract was discussed.  The pathophysiology was discussed.  Natural history risks without surgery was discussed.   I worked to give an overview of the disease and the frequent need to have multispecialty involvement.  I feel the risks of no intervention will lead to serious problems that outweigh the operative risks; therefore, I recommended a partial colectomy to remove the pathology.  Laparoscopic & open techniques were discussed.   Risks such as bleeding, infection, abscess, leak, reoperation, possible ostomy, hernia, heart attack, death, and other risks were discussed.  I noted a good likelihood this will help address the problem.   Goals of post-operative recovery were discussed as well.    The patient expressed understanding & wished to proceed with surgery.  OR FINDINGS:   Patient had mass located in her mid sigmoid colon.  Mass and tattoo were clearly visible.   No obvious metastatic disease on visceral parietal peritoneum or liver.  The  anastomosis rests ~11 cm from the anal verge by rigid proctoscopy.  DESCRIPTION:   Informed consent was confirmed.  The patient underwent general anaesthesia without difficulty.  The patient was positioned appropriately.  VTE prevention in place.  The patient's abdomen was clipped, prepped, & draped in a sterile fashion.  Surgical timeout confirmed our plan.  The patient was positioned in reverse Trendelenburg.  Abdominal entry was gained using a Varies needle in the LUQ.  Entry was clean.  I induced carbon dioxide insufflation.  An 48mm robotic port was placed in the RUQ.  Camera inspection revealed no injury.  Extra ports were carefully placed under direct laparoscopic visualization.  I laparoscopically reflected the greater omentum and the upper abdomen the small bowel in the upper abdomen. The patient was appropriately positioned and the robot was docked to the patient's left side.  Instruments were placed under direct visualization.    I mobilized the sigmoid colon off of the pelvic sidewall.  I scored the base of peritoneum of the right side of the mesentery of the left colon from the ligament of Treitz to the peritoneal reflection of the mid rectum.  I elevated the sigmoid mesentery and enetered into the retro-mesenteric plane. We were able to identify the left ureter and gonadal vessels. We kept those posterior within the retroperitoneum and elevated the left colon mesentery off that. I did isolated IMA pedicle but did not ligate it yet.  I continued distally and got into the avascular plane posterior to the mesorectum. This allowed me to help mobilize the rectum  as well by freeing the mesorectum off the sacrum.  I mobilized the peritoneal coverings towards the peritoneal reflection on both the right and left sides of the rectum.  I could see the right and left ureters and stayed away from them.    I skeletonized the inferior mesenteric artery pedicle.  I went down to its takeoff from the aorta.   After confirming the left ureter was out of the way, I went ahead and ligated the inferior mesenteric artery pedicle with bipolar robotic vessel sealer ~2cm above its takeoff from the aorta.  We ensured hemostasis. I skeletonized the mesorectum at the junction at the proximal rectum using blunt dissection & bipolar robotic vessel sealer.  I mobilized the left colon in a lateral to medial fashion off the line of Toldt up towards the splenic flexure to ensure good mobilization of the left colon to reach into the pelvis.  I identified my proximal resection margin.  This easily reached into the pelvis.  The mesentery was divided with the vessel sealer to this region.  The rectosigmoid junction was then divided using a blue load robotic stapler x2.  Once this was complete, the robot was undocked and a small Pfannenstiel incision was made suprapubically.  Sedalia wound protector was placed.  The colon was brought out through the wound and transected over a pursestring device with a 2-0 Prolene pursestring.  Pursestring was secured with 3-0 silk sutures.  A 31 mm EEA anvil was then placed into the colon and the pursestring was tied tightly around this.  This was then placed back into the abdomen and the Alexis wound retractor was capped.  We laparoscopically introduced the EEA into the rectal stump and created our anastomosis.  There was no tension.  There was no leak when tested with insufflation under water.  The anastomosis sits approximately 11 cm from the anal verge.  We then switched to clean gowns, gloves, instruments and drapes.  The peritoneum of the Pfannenstiel incision was closed using a running 2-0 Vicryl suture.  The fascia was closed using interrupted #1 Novafil sutures.  The subcutaneous tissue was reapproximated using interrupted 2-0 Vicryl sutures and the skin was closed using a running 4-0 Vicryl subcuticular suture.  The remaining port sites were also closed using a 4-0 Vicryl subcuticular suture.   Dermabond was placed on the port sites and a dressing was placed over the extraction site.  The patient tolerated procedure well was sent to the postanesthesia care unit stable condition.  All counts were correct per operating room staff.  An MD assistant was necessary for tissue manipulation, retraction and positioning due to the complexity of the case and hospital policies

## 2017-07-25 NOTE — Progress Notes (Signed)
Dr. Marcello Moores notified that pt forgot to take mirilax yesterday but took ducolax, mineral oil pill, neomycin, flagyl and gatorade.    Pt confirmed that after midnight she has only had water and one ensure drink, all consumed by 0745 this morning. Last solid last night at 2200.

## 2017-07-25 NOTE — Interval H&P Note (Signed)
History and Physical Interval Note:  07/25/2017 8:54 AM  Katie Reed  has presented today for surgery, with the diagnosis of Colon tumor  The various methods of treatment have been discussed with the patient and family. After consideration of risks, benefits and other options for treatment, the patient has consented to  Procedure(s): XI Severance (N/A) as a surgical intervention .  The patient's history has been reviewed, patient examined, no change in status, stable for surgery.  I have reviewed the patient's chart and labs.  Questions were answered to the patient's satisfaction.   All questions were answered.    Rosario Adie, MD  [facility]

## 2017-07-25 NOTE — Anesthesia Postprocedure Evaluation (Signed)
Anesthesia Post Note  Patient: Katie Reed  Procedure(s) Performed: XI ROBOT ASSISTED SIGMOID  COLECTOMY ERAS PATHWAY (N/A Abdomen)     Patient location during evaluation: PACU Anesthesia Type: General Level of consciousness: awake and alert Pain management: pain level controlled Vital Signs Assessment: post-procedure vital signs reviewed and stable Respiratory status: spontaneous breathing, nonlabored ventilation, respiratory function stable and patient connected to nasal cannula oxygen Cardiovascular status: blood pressure returned to baseline and stable Postop Assessment: no apparent nausea or vomiting Anesthetic complications: no    Last Vitals:  Vitals:   07/25/17 1330 07/25/17 1400  BP: 116/74 135/86  Pulse: 65   Resp: 13   Temp:  36.7 C  SpO2: 100%     Last Pain:  Vitals:   07/25/17 1400  TempSrc:   PainSc: 2                  Keylon Labelle,W. EDMOND

## 2017-07-25 NOTE — Anesthesia Procedure Notes (Signed)
Procedure Name: Intubation Date/Time: 07/25/2017 10:31 AM Performed by: Deliah Boston, CRNA Pre-anesthesia Checklist: Patient identified, Emergency Drugs available, Suction available and Patient being monitored Patient Re-evaluated:Patient Re-evaluated prior to induction Oxygen Delivery Method: Circle system utilized Preoxygenation: Pre-oxygenation with 100% oxygen Induction Type: IV induction Ventilation: Mask ventilation without difficulty Laryngoscope Size: Mac and 3 Grade View: Grade I Tube type: Oral Tube size: 7.5 mm Number of attempts: 1 Airway Equipment and Method: Oral airway Placement Confirmation: ETT inserted through vocal cords under direct vision,  positive ETCO2 and breath sounds checked- equal and bilateral Secured at: 22 cm Tube secured with: Tape Dental Injury: Teeth and Oropharynx as per pre-operative assessment

## 2017-07-26 ENCOUNTER — Other Ambulatory Visit: Payer: Self-pay

## 2017-07-26 LAB — BASIC METABOLIC PANEL
Anion gap: 7 (ref 5–15)
CHLORIDE: 107 mmol/L (ref 101–111)
CO2: 23 mmol/L (ref 22–32)
CREATININE: 0.81 mg/dL (ref 0.44–1.00)
Calcium: 9.2 mg/dL (ref 8.9–10.3)
GFR calc Af Amer: >60 mL/min (ref >60)
GFR calc non Af Amer: >60 mL/min (ref >60)
GLUCOSE: 115 mg/dL — AB (ref 65–99)
Potassium: 3.9 mmol/L (ref 3.5–5.1)
Sodium: 137 mmol/L (ref 135–145)

## 2017-07-26 LAB — CBC
HEMATOCRIT: 35.1 % — AB (ref 36.0–46.0)
Hemoglobin: 11.4 g/dL — ABNORMAL LOW (ref 12.0–15.0)
MCH: 29.5 pg (ref 26.0–34.0)
MCHC: 32.5 g/dL (ref 30.0–36.0)
MCV: 90.9 fL (ref 78.0–100.0)
PLATELETS: 299 10*3/uL (ref 150–400)
RBC: 3.86 MIL/uL — ABNORMAL LOW (ref 3.87–5.11)
RDW: 13.4 % (ref 11.5–15.5)
WBC: 9.5 10*3/uL (ref 4.0–10.5)

## 2017-07-26 MED ORDER — TRAMADOL HCL 50 MG PO TABS
50.0000 mg | ORAL_TABLET | Freq: Four times a day (QID) | ORAL | Status: DC | PRN
  Administered 2017-07-26 (×2): 100 mg via ORAL
  Filled 2017-07-26 (×2): qty 2

## 2017-07-26 NOTE — Progress Notes (Signed)
1 Day Post-Op robotic sigmoidectomy Subjective: Pt with no nausea, some flatus, tolerating fulls  Objective: Vital signs in last 24 hours: Temp:  [97.5 F (36.4 C)-99.1 F (37.3 C)] 98.6 F (37 C) (05/30 0520) Pulse Rate:  [53-88] 58 (05/30 0520) Resp:  [11-18] 16 (05/30 0520) BP: (111-142)/(65-90) 112/71 (05/30 0520) SpO2:  [100 %] 100 % (05/30 0520) Weight:  [88.1 kg (194 lb 4 oz)-89.9 kg (198 lb 4.8 oz)] 89.9 kg (198 lb 3 oz) (05/30 0500)   Intake/Output from previous day: 05/29 0701 - 05/30 0700 In: 3786.7 [P.O.:1770; I.V.:2016.7] Out: 4425 [Urine:4300; Stool:100; Blood:25] Intake/Output this shift: No intake/output data recorded.   General appearance: alert and cooperative GI: normal findings: soft, non-tender  Incision: no significant drainage  Lab Results:  Recent Labs    07/24/17 0835 07/26/17 0500  WBC 6.2 9.5  HGB 11.3* 11.4*  HCT 34.8* 35.1*  PLT 320 299   BMET Recent Labs    07/24/17 0835 07/26/17 0500  NA 138 137  K 3.8 3.9  CL 106 107  CO2 23 23  GLUCOSE 99 115*  BUN 15 <5*  CREATININE 0.94 0.81  CALCIUM 9.1 9.2   PT/INR No results for input(s): LABPROT, INR in the last 72 hours. ABG No results for input(s): PHART, HCO3 in the last 72 hours.  Invalid input(s): PCO2, PO2  MEDS, Scheduled . acetaminophen  1,000 mg Oral Q6H  . alvimopan  12 mg Oral BID  . enoxaparin (LOVENOX) injection  40 mg Subcutaneous Q24H  . saccharomyces boulardii  250 mg Oral BID    Studies/Results: No results found.  Assessment: s/p Procedure(s): XI ROBOT ASSISTED SIGMOID  COLECTOMY ERAS PATHWAY Patient Active Problem List   Diagnosis Date Noted  . Colonic mass 07/25/2017  . Intussusception intestine ([place]) 02/19/2017  . Depression   . Anxiety   . Active labor 04/01/2015  . ASCUS with positive high risk HPV 03/23/2015  . Supervision of high-risk pregnancy 12/31/2014  . High risk pregnancy due to history of preterm labor 12/31/2014  . AMA (advanced  maternal age) multigravida 35+ 12/31/2014  . Obesity in pregnancy 12/31/2014  . Tobacco smoking affecting pregnancy, antepartum 12/31/2014  . Abnormal glucose tolerance test (GTT) during pregnancy, antepartum 12/31/2014    Expected post op course  Plan: Advance diet  D/c foley SL IVF's Ambulate   LOS: 1 day     .Rosario Adie, [facility], [place]   07/26/2017 7:31 AM

## 2017-07-26 NOTE — Progress Notes (Signed)
ERAS education reinforced. Did patient attend class prior to procedure? Yes [   ] No [ x  ] Discussed: Pain Control [   ] Mobility [ x  ] Diet [ x  ] Other [   ]  Upon arrival patient in bed with compression stockings on. States she has walked the hallways and has sat in chair.  Tolerating diet and waiting on first solid tray. Encouraged to eat all meals in chair. Pecolia Ades, RN, BSN Quality Program Coordinator, Enhanced Recovery after Surgery 07/26/17 1:11 PM

## 2017-07-26 NOTE — Plan of Care (Signed)
Plan of care discussed with patient 

## 2017-07-27 LAB — CBC
HCT: 33.7 % — ABNORMAL LOW (ref 36.0–46.0)
Hemoglobin: 11 g/dL — ABNORMAL LOW (ref 12.0–15.0)
MCH: 29.7 pg (ref 26.0–34.0)
MCHC: 32.6 g/dL (ref 30.0–36.0)
MCV: 91.1 fL (ref 78.0–100.0)
PLATELETS: 287 10*3/uL (ref 150–400)
RBC: 3.7 MIL/uL — ABNORMAL LOW (ref 3.87–5.11)
RDW: 13.7 % (ref 11.5–15.5)
WBC: 7.9 10*3/uL (ref 4.0–10.5)

## 2017-07-27 LAB — BASIC METABOLIC PANEL
Anion gap: 7 (ref 5–15)
BUN: 9 mg/dL (ref 6–20)
CALCIUM: 8.9 mg/dL (ref 8.9–10.3)
CO2: 25 mmol/L (ref 22–32)
CREATININE: 0.9 mg/dL (ref 0.44–1.00)
Chloride: 108 mmol/L (ref 101–111)
Glucose, Bld: 90 mg/dL (ref 65–99)
Potassium: 3.5 mmol/L (ref 3.5–5.1)
Sodium: 140 mmol/L (ref 135–145)

## 2017-07-27 MED ORDER — TRAMADOL HCL 50 MG PO TABS: 50.0000 mg | ORAL_TABLET | Freq: Four times a day (QID) | ORAL | 0 refills | Status: DC | PRN

## 2017-07-27 NOTE — Progress Notes (Signed)
Discharge instructions given to Pt and all questions were answered.

## 2017-07-27 NOTE — Progress Notes (Signed)
2 Days Post-Op robotic sigmoidectomy Subjective: Pt with no nausea, having flatus, tolerating reg diet, pain controlled  Objective: Vital signs in last 24 hours: Temp:  [98.6 F (37 C)-99 F (37.2 C)] 98.8 F (37.1 C) (05/31 0545) Pulse Rate:  [58-67] 58 (05/31 0545) Resp:  [18] 18 (05/31 0545) BP: (115-128)/(71-78) 115/71 (05/31 0545) SpO2:  [100 %] 100 % (05/31 0545) Weight:  [90.4 kg (199 lb 6 oz)] 90.4 kg (199 lb 6 oz) (05/31 0500)   Intake/Output from previous day: 05/30 0701 - 05/31 0700 In: 300 [P.O.:300] Out: 600 [Urine:600] Intake/Output this shift: No intake/output data recorded.   General appearance: alert and cooperative GI: normal findings: soft, non-tender  Incision: no significant drainage  Lab Results:  Recent Labs    07/26/17 0500 07/27/17 0509  WBC 9.5 7.9  HGB 11.4* 11.0*  HCT 35.1* 33.7*  PLT 299 287   BMET Recent Labs    07/26/17 0500 07/27/17 0509  NA 137 140  K 3.9 3.5  CL 107 108  CO2 23 25  GLUCOSE 115* 90  BUN <5* 9  CREATININE 0.81 0.90  CALCIUM 9.2 8.9   PT/INR No results for input(s): LABPROT, INR in the last 72 hours. ABG No results for input(s): PHART, HCO3 in the last 72 hours.  Invalid input(s): PCO2, PO2  MEDS, Scheduled . acetaminophen  1,000 mg Oral Q6H  . alvimopan  12 mg Oral BID  . enoxaparin (LOVENOX) injection  40 mg Subcutaneous Q24H  . saccharomyces boulardii  250 mg Oral BID    Studies/Results: No results found.  Assessment: s/p Procedure(s): XI ROBOT ASSISTED SIGMOID  COLECTOMY ERAS PATHWAY Patient Active Problem List   Diagnosis Date Noted  . Colonic mass 07/25/2017  . Intussusception intestine ([place]) 02/19/2017  . Depression   . Anxiety   . Active labor 04/01/2015  . ASCUS with positive high risk HPV 03/23/2015  . Supervision of high-risk pregnancy 12/31/2014  . High risk pregnancy due to history of preterm labor 12/31/2014  . AMA (advanced maternal age) multigravida 35+ 12/31/2014  .  Obesity in pregnancy 12/31/2014  . Tobacco smoking affecting pregnancy, antepartum 12/31/2014  . Abnormal glucose tolerance test (GTT) during pregnancy, antepartum 12/31/2014    Expected post op course  Plan: Cont reg diet as tolerated D/c tomorrow if she is still doing well Ambulate   LOS: 2 days     .Rosario Adie, [facility], [place]   07/27/2017 8:34 AM

## 2017-07-27 NOTE — Discharge Instructions (Signed)

## 2017-07-27 NOTE — Discharge Summary (Signed)
Physician Discharge Summary  Patient ID: Katie Reed MRN: [id number] DOB/AGE: [birth year] 37 y.o.  Admit date: 07/25/2017 Discharge date: 07/27/2017  Admission Diagnoses: Colon mass  Discharge Diagnoses:  Active Problems:   Colonic Cancer: stage 2   Discharged Condition: good  Hospital Course: Pt admitted after surgery.  Her diet was advanced as tolerated.  By POD 2 her pain was controlled and she was tolerating a diet and having bowel function.  She was in stable condition for discharge.    Consults: None  Significant Diagnostic Studies: labs: cbc. bmet  Treatments: IV hydration, analgesia: acetaminophen and surgery: robotic sigmoidectomy  Discharge Exam: Blood pressure 121/69, pulse 69, temperature 98.9 F (37.2 C), temperature source Oral, resp. rate 19, height 5\' 3"  (1.6 m), weight 90.4 kg (199 lb 6 oz), last menstrual period 07/20/2017, SpO2 100 %. General appearance: alert and cooperative GI: soft, appropriately tender Incision/Wound:  Disposition: Discharge disposition: 01-Home or Self Care        Allergies as of 07/27/2017   No Known Allergies     Medication List    STOP taking these medications   dicyclomine 20 MG tablet Commonly known as:  BENTYL     TAKE these medications   clonazePAM 0.5 MG tablet Commonly known as:  KLONOPIN Take 1 tablet (0.5 mg total) by mouth 2 (two) times daily as needed for anxiety.   escitalopram 10 MG tablet Commonly known as:  LEXAPRO Take 1 tablet (10 mg total) by mouth daily.   ibuprofen 200 MG tablet Commonly known as:  ADVIL,MOTRIN Take 200 mg by mouth daily as needed for cramping.   megestrol 40 MG tablet Commonly known as:  MEGACE Take 1 tablet (40 mg total) by mouth daily.   omeprazole 40 MG capsule Commonly known as:  PRILOSEC Take 1 capsule (40 mg total) by mouth daily.   ondansetron 4 MG disintegrating tablet Commonly known as:  ZOFRAN ODT Take 1 tablet (4 mg total) by mouth every 8 (eight)  hours as needed for nausea or vomiting.   sucralfate 1 GM/10ML suspension Commonly known as:  CARAFATE Take 10 mLs (1 g total) by mouth 4 (four) times daily -  with meals and at bedtime.   traMADol 50 MG tablet Commonly known as:  ULTRAM Take 1-2 tablets (50-100 mg total) by mouth every 6 (six) hours as needed for moderate pain or severe pain.      Follow-up Information    Leighton Ruff, MD. Go on 3/00/7622.   Specialty:  General Surgery Why:  arrive at 8:45 am for a 9 am apt Contact information: [address] [place] [address] [phone]           Signed: Rosario Adie 7/34/2876, 8:11 PM

## 2017-08-01 ENCOUNTER — Telehealth: Payer: Self-pay | Admitting: Oncology

## 2017-08-01 ENCOUNTER — Encounter: Payer: Self-pay | Admitting: Oncology

## 2017-08-01 DIAGNOSIS — K6389 Other specified diseases of intestine: Secondary | ICD-10-CM

## 2017-08-01 NOTE — Telephone Encounter (Signed)
New referral received from Dr. Marcello Moores for new dx of colon cancer. Pt has been scheduled to see Dr. Benay Spice on 6/11 at 2pm. Pt was driving at the time when the appt was scheduled. Letter mailed with the appt date and time.

## 2017-08-01 NOTE — Progress Notes (Signed)
  Oncology Nurse Navigator Documentation  Navigator Location: [facility] (08/01/17 1030) Referral date to RadOnc/MedOnc: 08/01/17 (08/01/17 1030) )Navigator Encounter Type: Other(Referral) (08/01/17 1030)      Per Dr. Marcello Moores' request, referral to Dr. Benay Spice placed. Message sent to [employer].                       Interventions: Referrals (08/01/17 1030)            Acuity: Level 2 (08/01/17 1030)   Acuity Level 2: Assistance expediting appointments (08/01/17 1030)     Time Spent with Patient: 30 (08/01/17 1030)

## 2017-08-07 ENCOUNTER — Inpatient Hospital Stay: Payer: Self-pay | Attending: Oncology | Admitting: Oncology

## 2017-08-07 ENCOUNTER — Telehealth: Payer: Self-pay | Admitting: Oncology

## 2017-08-07 VITALS — BP 124/68 | HR 92 | Temp 97.8°F | Resp 18 | Ht 63.0 in | Wt 187.3 lb

## 2017-08-07 DIAGNOSIS — Z79899 Other long term (current) drug therapy: Secondary | ICD-10-CM | POA: Insufficient documentation

## 2017-08-07 DIAGNOSIS — K6389 Other specified diseases of intestine: Secondary | ICD-10-CM

## 2017-08-07 DIAGNOSIS — C187 Malignant neoplasm of sigmoid colon: Secondary | ICD-10-CM | POA: Insufficient documentation

## 2017-08-07 DIAGNOSIS — C189 Malignant neoplasm of colon, unspecified: Secondary | ICD-10-CM

## 2017-08-07 DIAGNOSIS — Z86010 Personal history of colonic polyps: Secondary | ICD-10-CM | POA: Insufficient documentation

## 2017-08-07 DIAGNOSIS — F418 Other specified anxiety disorders: Secondary | ICD-10-CM | POA: Insufficient documentation

## 2017-08-07 DIAGNOSIS — F17210 Nicotine dependence, cigarettes, uncomplicated: Secondary | ICD-10-CM | POA: Insufficient documentation

## 2017-08-07 HISTORY — DX: Malignant neoplasm of colon, unspecified: C18.9

## 2017-08-07 NOTE — Telephone Encounter (Signed)
Scheduled appt per 6/11 los - gave patient aVS and calender per los.  

## 2017-08-07 NOTE — Progress Notes (Signed)
Katie Reed   Referring MD: Leighton Ruff  Katie Reed 37 y.o.  Dec 21, 1980    Reason for Referral: Colon cancer   HPI: Ms.  Katie Reed developed acute abdominal pain 02/19/2017.  She presented to the emergency room.  A CT of the abdomen and pelvis revealed no focal liver abnormality.  The gallbladder was distended.  A short segment sigmoid intussusception was noted with colon thickening at this level.  No obstruction.  A few loops of distended small bowel were noted.  A right upper quadrant ultrasound revealed cholelithiasis without evidence of acute cholecystitis.  Dr. Alessandra Bevels was consulted.  A villous nonobstructing mass was found in the sigmoid colon at 40-45 cm proximal to the anus.  There was oozing of blood.  The mass was biopsied and tattooed.  A polypoid lesion was found in the rectum.  The lesion was biopsied.  No evidence of intussusception was found. The pathology [id number]) revealed a tubulovillous adenoma with focal high-grade dysplasia involving the sigmoid colon biopsy.  The rectum biopsy returned as a lymphoid polyp. The abdominal pain resolved.  She was discharged 02/20/2017. She underwent a full colonoscopy on 03/13/2017.  A 4 mm polyp was removed from the transverse colon.  A partially obstructing tumor was noted at 40 cm proximal to the anus.  A submucosal nodule was biopsied in the rectum.  The pathology from [facility] gastroenterology (EG [id number] [id number]) filled a tubular adenoma involving the transverse colon polyp, the rectum biopsy returned with colonic mucosa with reactive changes.  She presented the emergency room with abdominal pain, nausea, vomiting, and diarrhea 04/18/2017.  A CT the abdomen and pelvis found the previously distended small bowel loops to have resolved.  The colonic intussusception is resolved.  No residual colonic wall thickening.  No enlarged lymph nodes.  She was the tumor returned MSI-stable with no loss of  mismatch repair protein expression.  Planning to be evaluated by surgery at [facility] secondary to lack of medical insurance.  She decided to stay in [facility] and was referred to Dr. Marcello Moores. She was taken to the operating room for a robot assisted sigmoidectomy on 07/25/2017.  A mass was noted in the mid sigmoid colon.  No metastatic disease on the peritoneum or liver. The pathology [id number]) revealed an invasive well-differentiated adenocarcinoma.  Tumor was noted to arise from a tubulovillous adenoma with high-grade dysplasia.  The resection margins returned negative.  25 lymph nodes were negative for metastatic carcinoma.  No lymphovascular or perineural invasion.  No tumor deposit.  Tumor extended through the muscularis propria to just beneath the serosal surface. I discussed the case with Dr. Tresa Moore.  She indicates tumor was within a "cell "of the serosa and she could not exclude a T4 lesion.  She recommended treating the patient as though this were a T4 lesion.  She reports constipation following surgery.  No other complaint.   Past Medical History:  Diagnosis Date  . Anxiety   . Depression   . Preterm labor     .  G6, P5, 1 abortion  Past Surgical History:  Procedure Laterality Date  . FLEXIBLE SIGMOIDOSCOPY N/A 02/19/2017   Procedure: FLEXIBLE SIGMOIDOSCOPY;  Surgeon: Otis Brace, MD;  Location: [facility] ENDOSCOPY;  Service: Gastroenterology;  Laterality: N/A;  . NO PAST SURGERIES    . THERAPEUTIC ABORTION     elective    Medications: Reviewed  Allergies: No Known Allergies  Family history: ": "Cancer on her father's side of the  family, she cannot be more specific  Social History: She lives with her husband and children in [place].  She previously worked as a Doctor, hospital.  She smokes 1 to 2 cigarettes/day.  She drinks liquor every other day.    ROS:   Positives include: Constipation following sigmoid surgery, night sweats  A complete ROS was otherwise  negative.  Physical Exam:  Blood pressure 124/68, pulse 92, temperature 97.8 F (36.6 C), temperature source Oral, resp. rate 18, height _0  (1.6 m), weight 187 lb 4.8 oz (85 kg), last menstrual period 07/20/2017, SpO2 100 %.  HEENT: Oropharynx without visible mass, neck without mass Lungs: Clear bilaterally Cardiac: Regular rate and rhythm Abdomen: No hepatosplenomegaly, no mass, healed surgical incisions, nontender  Vascular: No leg edema Lymph nodes: No cervical, supraclavicular, axillary, or inguinal nodes Neurologic: Alert and oriented, the motor exam appears intact in the upper and lower extremities Skin: Multiple tattoos, no rash Musculoskeletal: No spine tenderness   LAB:  CBC  Lab Results  Component Value Date   WBC 7.9 07/27/2017   HGB 11.0 (L) 07/27/2017   HCT 33.7 (L) 07/27/2017   MCV 91.1 07/27/2017   PLT 287 07/27/2017   NEUTROABS 5.0 04/18/2017        CMP  Lab Results  Component Value Date   NA 140 07/27/2017   K 3.5 07/27/2017   CL 108 07/27/2017   CO2 25 07/27/2017   GLUCOSE 90 07/27/2017   BUN 9 07/27/2017   CREATININE 0.90 07/27/2017   CALCIUM 8.9 07/27/2017   PROT 7.4 04/18/2017   ALBUMIN 4.1 04/18/2017   AST 22 04/18/2017   ALT 36 04/18/2017   ALKPHOS 46 04/18/2017   BILITOT 0.7 04/18/2017   GFRNONAA >60 07/27/2017   GFRAA >60 07/27/2017     Lab Results  Component Value Date   CEA1 6.1 (H) 07/24/2017    Imaging:  As per HPI, CT images from 02/19/2017 and 04/18/2017 reviewed   Assessment/Plan:   1. 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 Transverse colon polyp noted on colonoscopy 03/13/2017, tubular  adenoma   Disposition:   Ms. Corona has been diagnosed with stage II colon cancer.  I discussed the prognosis and reviewed the details of the surgical pathology report with her.  We discussed adjuvant systemic treatment options.  I explained the T3 versus T4 nature of the primary tumor.  I personally discussed the pathology findings with the pathologist who indicates the lesion is better classified as an early T4 lesion.  The tumor has no other high risk feature, but given the patient's age and probable T4 tumor I recommend adjuvant systemic therapy.  I recommend a 69-monthcourse of capecitabine.  We reviewed potential toxicities associated with capecitabine including the chance for nausea/vomiting, mucositis, diarrhea, alopecia, and hematologic toxicity.  We discussed the rash, sun sensitivity, hyperpigmentation, and hand/foot syndrome associated with capecitabine.  She will attend a chemotherapy teaching class.  She agrees to proceed.  We will check a CEA when she returns for an office visit 08/29/2017.  She does not appear to have hereditary non-polyposis colon cancer syndrome, but her family members are increased risk of developing colon cancer and should receive appropriate screening.  She will be referred  to the genetics counselor.  The plan is to begin adjuvant capecitabine on 08/13/2017.  We will contact the Cancer center pharmacy for help with obtaining capecitabine while she is waiting on Medicaid approval.  50 minutes were spent with the patient today.  The majority of the time was used for counseling and coordination of care. Betsy Coder, MD  08/07/2017, 4:00 PM

## 2017-08-07 NOTE — Progress Notes (Signed)
Met with patient during initial med/onc apt. Role of Navigator explained and treatment team members identified. Patient lives with her husband and five children 79 and under. She does not have insurance and is concerned about paying for her oral chemo medications. Patient did say that her Medicaid is pending. Social work referral placed. Johny Drilling is aware of patient and will call patient in AM to discuss Xeloda and will direct pt to Financial  Advocate. Patient is scheduled for Chemo Education on 08/09/17. Patient provided with written information from [email or web address] related to colon cancer treatment and staging. Patient also provided with contact information for treatment team members. Will plan to call patient to check in after start of Xeloda. Patient knows she can call with questions or concerns.

## 2017-08-08 ENCOUNTER — Telehealth: Payer: Self-pay | Admitting: Pharmacist

## 2017-08-08 DIAGNOSIS — C187 Malignant neoplasm of sigmoid colon: Secondary | ICD-10-CM

## 2017-08-08 MED ORDER — CAPECITABINE 500 MG PO TABS: 2000.0000 mg | ORAL_TABLET | Freq: Two times a day (BID) | ORAL | 0 refills | Status: DC

## 2017-08-08 NOTE — Telephone Encounter (Signed)
Oral Oncology Pharmacist Encounter  Received new prescription for Xeloda (capecitabine) for the adjuvant treatment of stage II colon cancer, planned duration 6 months of Xeloda monotherapy. S/p resection on 07/25/17  Labs from Epic assessed, OK for treatment.  Current medication list in Epic reviewed, no significant DDIs with Xeloda identified.  Prescription has been e-scribed to the [facility] for benefits analysis and approval. Patient noted without prescription insurance coverage. We will discuss acquisition options with patient.  Oral Oncology Clinic will continue to follow for initial counseling and start date.  Johny Drilling, PharmD, BCPS, BCOP  08/08/2017 12:18 PM Oral Oncology Clinic [phone]

## 2017-08-08 NOTE — Telephone Encounter (Signed)
Oral Chemotherapy Pharmacist Encounter   Attempted to reach patient to provide update and offer for initial counseling on oral medication: Xeloda.  No answer. Left VM for patient to call back with any questions or issues.   Without prescription insurance, patient is eligible for 340b pricing at the [facility] (~$45/fill) Patient should be screened for eligibility to receive [place]. She will need to present proof of income documentation to [facility] financial advocate, Annamary Rummage, for screening for eligibility. If approved for the [place], copayment for Xeloda at the [place] ~$5/fill  I have spoken with Lenise about patient. Once she has received financial docs, Lenise with follow-up with patient about enrollment.  Above information as well as offer for initial counseling will be provided to patient once we are able to speak.   Thank you,  Johny Drilling, PharmD, BCPS, BCOP  08/08/2017   12:26 PM Oral Oncology Clinic [phone]

## 2017-08-09 ENCOUNTER — Telehealth: Payer: Self-pay | Admitting: Genetic Counselor

## 2017-08-09 ENCOUNTER — Inpatient Hospital Stay: Payer: Self-pay

## 2017-08-09 ENCOUNTER — Encounter: Payer: Self-pay | Admitting: Oncology

## 2017-08-09 DIAGNOSIS — K6389 Other specified diseases of intestine: Secondary | ICD-10-CM

## 2017-08-09 LAB — CBC WITH DIFFERENTIAL (CANCER CENTER ONLY)
BASOS ABS: 0 10*3/uL (ref 0.0–0.1)
Basophils Relative: 1 %
EOS PCT: 6 %
Eosinophils Absolute: 0.3 10*3/uL (ref 0.0–0.5)
HEMATOCRIT: 32.9 % — AB (ref 34.8–46.6)
Hemoglobin: 11.1 g/dL — ABNORMAL LOW (ref 11.6–15.9)
LYMPHS ABS: 2.5 10*3/uL (ref 0.9–3.3)
LYMPHS PCT: 45 %
MCH: 30.5 pg (ref 25.1–34.0)
MCHC: 33.8 g/dL (ref 31.5–36.0)
MCV: 90.1 fL (ref 79.5–101.0)
MONO ABS: 0.3 10*3/uL (ref 0.1–0.9)
MONOS PCT: 6 %
NEUTROS ABS: 2.3 10*3/uL (ref 1.5–6.5)
Neutrophils Relative %: 42 %
PLATELETS: 377 10*3/uL (ref 145–400)
RBC: 3.65 MIL/uL — ABNORMAL LOW (ref 3.70–5.45)
RDW: 13.7 % (ref 11.2–14.5)
WBC Count: 5.4 10*3/uL (ref 3.9–10.3)

## 2017-08-09 LAB — CMP (CANCER CENTER ONLY)
ALT: 23 U/L (ref 0–55)
AST: 13 U/L (ref 5–34)
Albumin: 3.9 g/dL (ref 3.5–5.0)
Alkaline Phosphatase: 51 U/L (ref 40–150)
Anion gap: 7 (ref 3–11)
BILIRUBIN TOTAL: 0.2 mg/dL (ref 0.2–1.2)
BUN: 12 mg/dL (ref 7–26)
CHLORIDE: 107 mmol/L (ref 98–109)
CO2: 24 mmol/L (ref 22–29)
Calcium: 9.3 mg/dL (ref 8.4–10.4)
Creatinine: 0.99 mg/dL (ref 0.60–1.10)
Glucose, Bld: 93 mg/dL (ref 70–140)
POTASSIUM: 4 mmol/L (ref 3.5–5.1)
Sodium: 138 mmol/L (ref 136–145)
TOTAL PROTEIN: 7.1 g/dL (ref 6.4–8.3)

## 2017-08-09 MED ORDER — CAPECITABINE 500 MG PO TABS: 2000.0000 mg | ORAL_TABLET | Freq: Two times a day (BID) | ORAL | 1 refills | Status: DC

## 2017-08-09 MED FILL — XELODA 500 MG TABLET: 500 | 63 days supply | Qty: 336 | Fill #0

## 2017-08-09 NOTE — Telephone Encounter (Signed)
Oral Chemotherapy Pharmacist Encounter   I spoke with patient in chemotherapy education class for overview of: Xeloda (capecitabine).   Counseled patient on administration, dosing, side effects, monitoring, drug-food interactions, safe handling, storage, and disposal.  Patient will take Xeloda 500mg  tablets, 4 tablets (2000mg ) by mouth in AM and 4 tabs (2000mg ) by mouth in PM, within 30 minutes of finishing meals, on days 1-14 of each 21 day cycle.   Xeloda start date: 08/13/17  Adverse effects include but are not limited to: fatigue, decreased blood counts, GI upset, diarrhea, and hand-foot syndrome.  Patient has anti-emetic on hand and knows to take it if nausea develops.   Patient will obtain anti diarrheal and alert the office of 4 or more loose stools above baseline.  Reviewed with patient importance of keeping a medication schedule and plan for any missed doses.  Katie Reed voiced understanding and appreciation.   All questions answered. Medication reconciliation performed and medication/allergy list updated.  Patient will be screened for eligibility for [place] to help reduce copayments. There is additional documentation that is required at this time for approval. Patient is working with [facility] financial advocate for this grant. Patient states she applied for Medicaid 2 weeks ago, this usually takes 90 days for determination.  Patient is agreeable to $45 fill for now, we will have the pharmacy prepare 3 cycles worth (#336) for pick-up today for the $45. Katie Reed will start her Xeloda on Monday 08/13/17.   Patient knows to call the office with questions or concerns. Oral Oncology Clinic will continue to follow.  Thank you,  Johny Drilling, PharmD, BCPS, BCOP  08/09/2017   11:14 AM Oral Oncology Clinic [phone]

## 2017-08-09 NOTE — Progress Notes (Signed)
Met w/ pt to introduce myself as her Arboriculturist and to discuss financial assistance.  Pt informed me that she's not working and her husband just quit his job so they have no income at this time, they are living off of their savings.  She has applied for Medicaid which is still pending.  I informed her of the [place] and what is required to apply.  She had tax documents w/ her but they are too old for me to use so she will provide a letter of support.  Once received I will approve her for the [place].  She has my card for questions or concerns she may have in the future.

## 2017-08-09 NOTE — Telephone Encounter (Signed)
Scheduled appt per 6/11 sch message - pt is aware of appt date and time  ° °

## 2017-08-15 NOTE — Progress Notes (Signed)
  Oncology Nurse Navigator Documentation  Navigator Location: [facility] (08/15/17 1527)   )Navigator Encounter Type: Telephone (08/15/17 1527) Telephone: Incoming Call(Called to offer support.) (08/15/17 1527)                 Treatment Initiated Date: 08/13/17(Started Oral Chemo, Xeloda) (08/15/17 1527)                      Acuity: Level 2 (08/15/17 1527)   Acuity Level 2: Ongoing guidance and education throughout treatment as needed (08/15/17 1527)     Time Spent with Patient: 15 (08/15/17 1527)

## 2017-08-29 ENCOUNTER — Inpatient Hospital Stay: Payer: Medicaid Other | Attending: Oncology | Admitting: Oncology

## 2017-08-29 DIAGNOSIS — C187 Malignant neoplasm of sigmoid colon: Secondary | ICD-10-CM | POA: Insufficient documentation

## 2017-08-29 DIAGNOSIS — R110 Nausea: Secondary | ICD-10-CM | POA: Insufficient documentation

## 2017-09-03 ENCOUNTER — Inpatient Hospital Stay: Payer: Medicaid Other | Admitting: Genetic Counselor

## 2017-09-03 ENCOUNTER — Inpatient Hospital Stay: Payer: Medicaid Other

## 2017-09-04 ENCOUNTER — Inpatient Hospital Stay: Payer: Medicaid Other | Admitting: Nurse Practitioner

## 2017-09-05 ENCOUNTER — Telehealth: Payer: Self-pay | Admitting: Pharmacist

## 2017-09-05 NOTE — Telephone Encounter (Signed)
Oral Oncology Pharmacist Encounter  Received message from patient stating she was returning call about Xeloda follow-up. Noted patient missed office visit scheduled with [name], NP yesterday (09/04/2017). Spoke with collaborative practice LPN working with [name] in order to get patient worked back into the office.  I called patient to follow-up on Xeloda. Patient stated she has completed 1 cycle already. Xeloda start date: 08/13/17  She did pick up a 29-month supply at the pharmacy previously. Patient states she has heard from LPN working with [name] and that the plan is for someone to call her back to give her a new appointment time.  Patient states she is able to take her Xeloda as prescribed. She is taking Xeloda 500 mg tablets, 4 tablets (2000 mg) by mouth 2 times daily, taken within 30 minutes of finishing food, taken for 14 days on, 7 days off, repeated every 21 days. Patient states she has missed 0 tablets or doses.  Patient endorses skin coloration changes on hands and feet. She says her hands are turning, and her feet have already turned a very dark shade. Patient denies any skin breakdown or other skin changes in those areas. Patient denies any GI upset. We discussed that she may very well experience continued skin changes during her 51-month course of Xeloda. She will continue to keep the office posted about any adverse effects she may experience.  Patient knows to call the office with any additional questions or concerns.  Johny Drilling, PharmD, BCPS, BCOP  09/05/2017 12:52 PM Oral Oncology Clinic [phone]

## 2017-09-06 ENCOUNTER — Telehealth: Payer: Self-pay | Admitting: Nurse Practitioner

## 2017-09-06 NOTE — Telephone Encounter (Signed)
Returned patients vmail to r/s missed appt from 7/9 - pt is aware of new appt date and time and stated that she did not want to reschedule genetics appt -

## 2017-09-11 ENCOUNTER — Emergency Department (HOSPITAL_COMMUNITY): Admission: EM | Admit: 2017-09-11 | Discharge: 2017-09-11 | Discharge status: Against Medical Advice | Payer: Self-pay

## 2017-09-11 NOTE — ED Triage Notes (Signed)
Per registration staff pt left

## 2017-09-11 NOTE — ED Triage Notes (Signed)
Per EMS- Patient  Had chemo today. Patient called EMS stating that the patient had LOC. When EMS arrived, the patient was intoxicated.  While receiving report from EMS, patient jumped off of the stretcher , cussing and being beligerent while walking out of triage into the lobby. Patient asked several times to come back into triage,but patient kept walking prior to VS being taken.

## 2017-09-14 ENCOUNTER — Telehealth: Payer: Self-pay

## 2017-09-14 ENCOUNTER — Encounter: Payer: Self-pay | Admitting: Nurse Practitioner

## 2017-09-14 ENCOUNTER — Telehealth: Payer: Self-pay | Admitting: Emergency Medicine

## 2017-09-14 ENCOUNTER — Inpatient Hospital Stay: Payer: Medicaid Other

## 2017-09-14 ENCOUNTER — Inpatient Hospital Stay (HOSPITAL_BASED_OUTPATIENT_CLINIC_OR_DEPARTMENT_OTHER): Payer: Medicaid Other | Admitting: Nurse Practitioner

## 2017-09-14 ENCOUNTER — Telehealth: Payer: Self-pay | Admitting: Pharmacist

## 2017-09-14 VITALS — BP 111/73 | HR 72 | Temp 98.4°F | Resp 17 | Ht 63.0 in | Wt 191.8 lb

## 2017-09-14 DIAGNOSIS — C189 Malignant neoplasm of colon, unspecified: Secondary | ICD-10-CM

## 2017-09-14 DIAGNOSIS — R110 Nausea: Secondary | ICD-10-CM | POA: Diagnosis not present

## 2017-09-14 DIAGNOSIS — C187 Malignant neoplasm of sigmoid colon: Secondary | ICD-10-CM | POA: Diagnosis not present

## 2017-09-14 LAB — CMP (CANCER CENTER ONLY)
ALK PHOS: 49 U/L (ref 38–126)
ALT: 18 U/L (ref 0–44)
AST: 13 U/L — AB (ref 15–41)
Albumin: 4.1 g/dL (ref 3.5–5.0)
Anion gap: 8 (ref 5–15)
BILIRUBIN TOTAL: 0.7 mg/dL (ref 0.3–1.2)
BUN: 10 mg/dL (ref 6–20)
CALCIUM: 8.7 mg/dL — AB (ref 8.9–10.3)
CHLORIDE: 104 mmol/L (ref 98–111)
CO2: 26 mmol/L (ref 22–32)
CREATININE: 0.87 mg/dL (ref 0.44–1.00)
Glucose, Bld: 82 mg/dL (ref 70–99)
Potassium: 3.3 mmol/L — ABNORMAL LOW (ref 3.5–5.1)
Sodium: 138 mmol/L (ref 135–145)
TOTAL PROTEIN: 7.4 g/dL (ref 6.5–8.1)

## 2017-09-14 LAB — CBC WITH DIFFERENTIAL (CANCER CENTER ONLY)
BASOS PCT: 1 %
Basophils Absolute: 0 10*3/uL (ref 0.0–0.1)
EOS ABS: 0.1 10*3/uL (ref 0.0–0.5)
Eosinophils Relative: 2 %
HCT: 35.1 % (ref 34.8–46.6)
Hemoglobin: 11.6 g/dL (ref 11.6–15.9)
LYMPHS ABS: 1.6 10*3/uL (ref 0.9–3.3)
Lymphocytes Relative: 41 %
MCH: 30.9 pg (ref 25.1–34.0)
MCHC: 33 g/dL (ref 31.5–36.0)
MCV: 93.5 fL (ref 79.5–101.0)
Monocytes Absolute: 0.2 10*3/uL (ref 0.1–0.9)
Monocytes Relative: 6 %
Neutro Abs: 1.9 10*3/uL (ref 1.5–6.5)
Neutrophils Relative %: 50 %
PLATELETS: 275 10*3/uL (ref 145–400)
RBC: 3.75 MIL/uL (ref 3.70–5.45)
RDW: 16.4 % — ABNORMAL HIGH (ref 11.2–14.5)
WBC: 3.9 10*3/uL (ref 3.9–10.3)

## 2017-09-14 LAB — PREGNANCY, URINE: Preg Test, Ur: NEGATIVE

## 2017-09-14 NOTE — Telephone Encounter (Signed)
Printed avs and calender of upcoming appointment. Per 7/19 los 

## 2017-09-14 NOTE — Telephone Encounter (Addendum)
Pt voiced understanding of this note   ----- Message from Owens Shark, NP sent at 09/14/2017 12:15 PM EDT ----- Please let her know the urine pregnancy test is negative.  Potassium is mildly low.  This may be related to the intermittent diarrhea.  Instruct her in the use of Imodium and to contact the office with poorly controlled diarrhea.  Try dietary changes to increase potassium.  We will repeat labs with her next visit.  Thanks

## 2017-09-14 NOTE — Telephone Encounter (Signed)
Oral Oncology Pharmacist Encounter  Spoke with patient in exam room today with questions of the use of Xeloda in the first trimester pregnancy. Patient has completed 2 cycles of monotherapy adjuvant Xeloda. Patient states her cycle is very normal, and she is now late. She has not taken a pregnancy test. Xeloda will be placed on hold until pregnancy status is confirmed.  Patient has follow-up with her OB/GYN on 09/20/2017 to discuss options for oral contraception. No medication interactions with Xeloda and most forms of oral contraception. This information has been relayed to patient.  Literature search showed an adverse risk of fetal harm and pregnancy, the fetus is at most risk during the first trimester.  Patient will follow-up with MD if pregnancy is confirmed. We will work with MD and patient to determine safest method of chemotherapy in this instance.  Patient will continue Xeloda if she is confirmed to not be pregnant.  Discussed with patient that 2 methods of birth control will be the most effective during this time on chemotherapy.  Patient expressed understanding and appreciation. We will follow-up results of pregnancy test.  Johny Drilling, PharmD, BCPS, BCOP  09/14/2017 10:53 AM Oral Oncology Clinic [phone]

## 2017-09-14 NOTE — Progress Notes (Signed)
  [place] OFFICE PROGRESS NOTE   Diagnosis: Colon cancer  INTERVAL HISTORY:   Ms. Lucking returns for follow-up.  She completed cycle 1 adjuvant Xeloda beginning 08/13/2017.  She can cycle 2 on 09/03/2017.  She had a recent single episode of nausea/vomiting.  She thinks this was due to being overheated rather than the Xeloda.  No mouth sores.  She has intermittent loose stools which she feels is diet related.  She has noted darkening of the palms and soles.  No erythema or skin breakdown.  She reports her last menstrual cycle was around 08/13/2017.  She is concerned she could be pregnant.  She is not using contraception.  She has scheduled an appointment with her gynecologist to discuss contraception.  Objective:  Vital signs in last 24 hours:  Blood pressure 111/73, pulse 72, temperature 98.4 F (36.9 C), temperature source Oral, resp. rate 17, height '5\' 3"'$  (1.6 m), weight 191 lb 12.8 oz (87 kg), SpO2 100 %.    HEENT: No thrush or ulcers. Resp: Lungs clear bilaterally. Cardio: Regular rate and rhythm. GI: Abdomen soft and nontender.  No hepatomegaly. Vascular: No leg edema.  Skin: Palms and soles with hyperpigmentation.  No erythema.  No skin breakdown.   Lab Results:  Lab Results  Component Value Date   WBC 5.4 08/09/2017   HGB 11.1 (L) 08/09/2017   HCT 32.9 (L) 08/09/2017   MCV 90.1 08/09/2017   PLT 377 08/09/2017   NEUTROABS 2.3 08/09/2017    Imaging:  No results found.  Medications: I have reviewed the patient's current medications.  Assessment/Plan: 1. 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 0/25 lymph nodes positive ? Tumor penetrated through the muscularis to within a cell of the serosal surface per personal discussion with the pathologist, therefore clinical impression is of a early T4 lesion ? Cycle 1 adjuvant Xeloda 08/13/2017 ? Cycle 2 adjuvant  Xeloda 09/03/2017 2. Transverse colon polyp noted on colonoscopy 03/13/2017, tubular adenoma   Disposition: Ms. Habermehl appears stable.  She is completing cycle 2 adjuvant Xeloda.  Overall she seems to be tolerating the Xeloda well.  At today's visit she reports concern regarding pregnancy.  She will return to the lab today for a urine pregnancy test.  The [place] oral chemotherapy pharmacist and I discussed with her the importance of effective contraception not only while she is taking Xeloda but also for 6 months after she has completed the course of  chemotherapy.  She understands to hold Xeloda until we have results from the urine pregnancy test.  She will return for a follow-up visit in approximately 3 weeks.  We will adjust accordingly pending outstanding labs from today.   [name] ANP/GNP-BC   09/14/2017  9:39 AM

## 2017-09-17 ENCOUNTER — Telehealth: Payer: Self-pay | Admitting: *Deleted

## 2017-09-17 NOTE — Telephone Encounter (Signed)
Patient called requesting anxiety medication. She had used Klonopin in the past and would like something to help her sleep and deal with her anxiety during the day. Per Dr. Benay Spice she needs to have this addressed with her PCP. Patient advised and verbalized an understanding.

## 2017-10-10 MED FILL — XELODA 500 MG TABLET: 500 | 21 days supply | Qty: 112 | Fill #1

## 2017-10-12 ENCOUNTER — Inpatient Hospital Stay: Payer: Medicaid Other | Attending: Oncology | Admitting: Oncology

## 2017-10-12 ENCOUNTER — Inpatient Hospital Stay: Payer: Medicaid Other

## 2017-10-12 VITALS — BP 120/76 | HR 79 | Temp 99.0°F | Resp 18 | Ht 63.0 in | Wt 190.1 lb

## 2017-10-12 DIAGNOSIS — C187 Malignant neoplasm of sigmoid colon: Secondary | ICD-10-CM | POA: Diagnosis present

## 2017-10-12 DIAGNOSIS — K5900 Constipation, unspecified: Secondary | ICD-10-CM | POA: Insufficient documentation

## 2017-10-12 DIAGNOSIS — Z86010 Personal history of colonic polyps: Secondary | ICD-10-CM | POA: Diagnosis not present

## 2017-10-12 DIAGNOSIS — C189 Malignant neoplasm of colon, unspecified: Secondary | ICD-10-CM

## 2017-10-12 DIAGNOSIS — R197 Diarrhea, unspecified: Secondary | ICD-10-CM | POA: Diagnosis not present

## 2017-10-12 LAB — CBC WITH DIFFERENTIAL (CANCER CENTER ONLY)
BASOS ABS: 0 10*3/uL (ref 0.0–0.1)
BASOS PCT: 1 %
EOS ABS: 0.1 10*3/uL (ref 0.0–0.5)
EOS PCT: 1 %
HCT: 34.4 % — ABNORMAL LOW (ref 34.8–46.6)
HEMOGLOBIN: 11.5 g/dL — AB (ref 11.6–15.9)
LYMPHS ABS: 1.7 10*3/uL (ref 0.9–3.3)
Lymphocytes Relative: 33 %
MCH: 31.8 pg (ref 25.1–34.0)
MCHC: 33.3 g/dL (ref 31.5–36.0)
MCV: 95.4 fL (ref 79.5–101.0)
Monocytes Absolute: 0.4 10*3/uL (ref 0.1–0.9)
Monocytes Relative: 8 %
NEUTROS PCT: 57 %
Neutro Abs: 3 10*3/uL (ref 1.5–6.5)
PLATELETS: 286 10*3/uL (ref 145–400)
RBC: 3.61 MIL/uL — AB (ref 3.70–5.45)
RDW: 19 % — ABNORMAL HIGH (ref 11.2–14.5)
WBC: 5.2 10*3/uL (ref 3.9–10.3)

## 2017-10-12 LAB — CMP (CANCER CENTER ONLY)
ALBUMIN: 4.4 g/dL (ref 3.5–5.0)
ALK PHOS: 48 U/L (ref 38–126)
ALT: 16 U/L (ref 0–44)
AST: 15 U/L (ref 15–41)
Anion gap: 8 (ref 5–15)
BUN: 16 mg/dL (ref 6–20)
CALCIUM: 9 mg/dL (ref 8.9–10.3)
CHLORIDE: 108 mmol/L (ref 98–111)
CO2: 24 mmol/L (ref 22–32)
CREATININE: 0.98 mg/dL (ref 0.44–1.00)
GFR, Estimated: >60 mL/min (ref >60)
GLUCOSE: 92 mg/dL (ref 70–99)
Potassium: 4 mmol/L (ref 3.5–5.1)
SODIUM: 140 mmol/L (ref 135–145)
Total Bilirubin: 0.4 mg/dL (ref 0.3–1.2)
Total Protein: 7.8 g/dL (ref 6.5–8.1)

## 2017-10-12 NOTE — Progress Notes (Signed)
  [place] OFFICE PROGRESS NOTE   Diagnosis: Colon cancer  INTERVAL HISTORY:   Katie Reed returns as scheduled.  She began the last cycle of Xeloda on 09/24/2017.  No mouth sores.  She reports mild diarrhea and constipation.  No hand or foot pain.  Objective:  Vital signs in last 24 hours:  Blood pressure 120/76, pulse 79, temperature 99 F (37.2 C), temperature source Oral, resp. rate 18, height '5\' 3"'$  (1.6 m), weight 190 lb 1.6 oz (86.2 kg), SpO2 100 %.    HEENT: No thrush or ulcers Resp: Lungs clear bilaterally Cardio: Regular rate and rhythm GI: Nontender, no hepatomegaly Vascular: No leg edema  Skin: Hyperpigmentation and skin thickening at the palms and soles, no erythema  Lab Results:  Lab Results  Component Value Date   WBC 3.9 09/14/2017   HGB 11.6 09/14/2017   HCT 35.1 09/14/2017   MCV 93.5 09/14/2017   PLT 275 09/14/2017   NEUTROABS 1.9 09/14/2017    CMP  Lab Results  Component Value Date   NA 138 09/14/2017   K 3.3 (L) 09/14/2017   CL 104 09/14/2017   CO2 26 09/14/2017   GLUCOSE 82 09/14/2017   BUN 10 09/14/2017   CREATININE 0.87 09/14/2017   CALCIUM 8.7 (L) 09/14/2017   PROT 7.4 09/14/2017   ALBUMIN 4.1 09/14/2017   AST 13 (L) 09/14/2017   ALT 18 09/14/2017   ALKPHOS 49 09/14/2017   BILITOT 0.7 09/14/2017   GFRNONAA >60 09/14/2017   GFRAA >60 09/14/2017    Lab Results  Component Value Date   CEA1 6.1 (H) 07/24/2017    Medications: I have reviewed the patient's current medications.   Assessment/Plan: 1. Stage IIb (T4a, N0) well-differentiated adenocarcinoma of the sigmoid colon, status post a robotic assisted sigmoidectomy 07/25/2017, MSI-stable, no loss of mismatch repair protein expression ? 0/25 lymph nodes positive ? Tumor penetrated through the muscularis to within a cell of the serosal surface per personal discussion with the pathologist, therefore clinical impression is of a early T4 lesion ? Cycle 1 adjuvant  Xeloda 08/13/2017 ? Cycle 2 adjuvant Xeloda 09/03/2017 ? Cycle 3  adjuvant Xeloda 09/24/2017 ? Cycle 4 adjuvant Xeloda 10/15/2017 2. Transverse colon polyp noted on colonoscopy 03/13/2017, tubular adenoma     Disposition: She appears stable.  We will follow-up on the CBC and chemistry panel from today.  She will begin cycle 4 Xeloda on 10/15/2017.  Katie Reed will return for an office visit in 3 weeks.  Betsy Coder, MD  10/12/2017  11:32 AM

## 2017-10-15 ENCOUNTER — Telehealth: Payer: Self-pay | Admitting: Oncology

## 2017-10-15 NOTE — Telephone Encounter (Signed)
LMVM for patient with date/time of appointment per 8/16 los

## 2017-10-31 MED FILL — CAPECITABINE 500 MG TABLET: 500 | 21 days supply | Qty: 112 | Fill #0

## 2017-11-02 ENCOUNTER — Ambulatory Visit: Payer: Medicaid Other | Admitting: Nurse Practitioner

## 2017-11-02 ENCOUNTER — Other Ambulatory Visit: Payer: Medicaid Other

## 2017-11-06 ENCOUNTER — Telehealth: Payer: Self-pay | Admitting: Oncology

## 2017-11-06 NOTE — Telephone Encounter (Signed)
Appts r/s and LMVM for patient with new date/time per 9/10 sch msg

## 2017-11-07 ENCOUNTER — Other Ambulatory Visit: Payer: Medicaid Other

## 2017-11-07 ENCOUNTER — Ambulatory Visit: Payer: Medicaid Other | Admitting: Nurse Practitioner

## 2017-11-09 ENCOUNTER — Encounter: Payer: Self-pay | Admitting: Nurse Practitioner

## 2017-11-09 ENCOUNTER — Inpatient Hospital Stay: Payer: Medicaid Other | Attending: Oncology

## 2017-11-09 ENCOUNTER — Inpatient Hospital Stay (HOSPITAL_BASED_OUTPATIENT_CLINIC_OR_DEPARTMENT_OTHER): Payer: Medicaid Other | Admitting: Nurse Practitioner

## 2017-11-09 ENCOUNTER — Telehealth: Payer: Self-pay | Admitting: Nurse Practitioner

## 2017-11-09 VITALS — BP 111/67 | HR 65 | Temp 99.1°F | Resp 17 | Ht 63.0 in | Wt 188.1 lb

## 2017-11-09 DIAGNOSIS — R197 Diarrhea, unspecified: Secondary | ICD-10-CM | POA: Insufficient documentation

## 2017-11-09 DIAGNOSIS — D123 Benign neoplasm of transverse colon: Secondary | ICD-10-CM

## 2017-11-09 DIAGNOSIS — C187 Malignant neoplasm of sigmoid colon: Secondary | ICD-10-CM | POA: Diagnosis not present

## 2017-11-09 LAB — CBC WITH DIFFERENTIAL (CANCER CENTER ONLY)
Basophils Absolute: 0 10*3/uL (ref 0.0–0.1)
Basophils Relative: 0 %
EOS ABS: 0.1 10*3/uL (ref 0.0–0.5)
EOS PCT: 2 %
HCT: 34 % — ABNORMAL LOW (ref 34.8–46.6)
HEMOGLOBIN: 11.5 g/dL — AB (ref 11.6–15.9)
LYMPHS ABS: 2.6 10*3/uL (ref 0.9–3.3)
Lymphocytes Relative: 40 %
MCH: 32.8 pg (ref 25.1–34.0)
MCHC: 33.8 g/dL (ref 31.5–36.0)
MCV: 96.9 fL (ref 79.5–101.0)
MONOS PCT: 5 %
Monocytes Absolute: 0.3 10*3/uL (ref 0.1–0.9)
NEUTROS PCT: 53 %
Neutro Abs: 3.5 10*3/uL (ref 1.5–6.5)
Platelet Count: 295 10*3/uL (ref 145–400)
RBC: 3.51 MIL/uL — ABNORMAL LOW (ref 3.70–5.45)
RDW: 16.9 % — ABNORMAL HIGH (ref 11.2–14.5)
WBC: 6.5 10*3/uL (ref 3.9–10.3)

## 2017-11-09 LAB — CMP (CANCER CENTER ONLY)
ALK PHOS: 57 U/L (ref 38–126)
ALT: 15 U/L (ref 0–44)
AST: 15 U/L (ref 15–41)
Albumin: 4.4 g/dL (ref 3.5–5.0)
Anion gap: 8 (ref 5–15)
BUN: 18 mg/dL (ref 6–20)
CALCIUM: 9.4 mg/dL (ref 8.9–10.3)
CHLORIDE: 107 mmol/L (ref 98–111)
CO2: 25 mmol/L (ref 22–32)
CREATININE: 1.11 mg/dL — AB (ref 0.44–1.00)
Glucose, Bld: 93 mg/dL (ref 70–99)
Potassium: 3.7 mmol/L (ref 3.5–5.1)
Sodium: 140 mmol/L (ref 135–145)
Total Bilirubin: 0.6 mg/dL (ref 0.3–1.2)
Total Protein: 7.6 g/dL (ref 6.5–8.1)

## 2017-11-09 LAB — CEA (IN HOUSE-CHCC): CEA (CHCC-IN HOUSE): 2.18 ng/mL (ref 0.00–5.00)

## 2017-11-09 NOTE — Telephone Encounter (Signed)
Scheduled appt per 9/13 los - gave patient AVS and calender per los.  

## 2017-11-09 NOTE — Progress Notes (Signed)
  [facility] OFFICE PROGRESS NOTE   Diagnosis: Colon cancer  INTERVAL HISTORY:   Katie Reed returns for follow-up.  She began cycle 5 adjuvant Xeloda 11/04/2017.  She denies nausea/vomiting.  No mouth sores.  She has occasional diarrhea.  No hand or foot pain or redness.  She does note darkening of the skin on her hands and feet.  Objective:  Vital signs in last 24 hours:  Blood pressure 111/67, pulse 65, temperature 99.1 F (37.3 C), temperature source Oral, resp. rate 17, height '5\' 3"'$  (1.6 m), weight 188 lb 1.6 oz (85.3 kg), SpO2 100 %.    HEENT: No thrush or ulcers. Resp: Lungs clear bilaterally. Cardio: Regular rate and rhythm. GI: Abdomen soft and nontender.  No hepatomegaly. Vascular: No leg edema. Skin: Palms with hyperpigmentation and skin thickening.  No erythema or skin breakdown.   Lab Results:  Lab Results  Component Value Date   WBC 6.5 11/09/2017   HGB 11.5 (L) 11/09/2017   HCT 34.0 (L) 11/09/2017   MCV 96.9 11/09/2017   PLT 295 11/09/2017   NEUTROABS 3.5 11/09/2017    Imaging:  No results found.  Medications: I have reviewed the patient's current medications.  Assessment/Plan: 1. Stage IIb (T4a, N0) well-differentiated adenocarcinoma of the sigmoid colon, status post a robotic assisted sigmoidectomy 07/25/2017, MSI-stable, no loss of mismatch repair protein expression ? 0/25 lymph nodes positive ? Tumor penetrated through the muscularis to within a cell of the serosal surface per personal discussion with the pathologist, therefore clinical impression is of a early T4 lesion ? Cycle 1 adjuvant Xeloda 08/13/2017 ? Cycle 2 adjuvant Xeloda 09/03/2017 ? Cycle 3  adjuvant Xeloda 09/24/2017 ? Cycle 4 adjuvant Xeloda 10/15/2017 ? Cycle 5 adjuvant Xeloda 11/04/2017 2. Transverse colon polyp noted on colonoscopy 03/13/2017, tubular adenoma   Disposition: Katie Reed appears stable.  She began cycle 5 adjuvant Xeloda 11/04/2017.  She is tolerating the  Xeloda well.  We reviewed the CBC from today.  Counts are adequate to continue the current treatment.  She will return for lab and follow-up in 2 weeks.  She will contact the office in the interim with any problems.   [name] ANP/GNP-BC   11/09/2017  3:31 PM

## 2017-11-23 ENCOUNTER — Inpatient Hospital Stay: Payer: Medicaid Other

## 2017-11-23 ENCOUNTER — Inpatient Hospital Stay: Payer: Medicaid Other | Admitting: Nurse Practitioner

## 2017-11-28 ENCOUNTER — Telehealth: Payer: Self-pay | Admitting: Oncology

## 2017-11-28 NOTE — Telephone Encounter (Signed)
Patient called regarding her missed appointment on 9/27.  She is aware she has an appt on 10/17.  She wanted to speak to the nurse to see if she needed to see the doctor before 10/17.

## 2017-11-29 ENCOUNTER — Telehealth: Payer: Self-pay | Admitting: Pharmacist

## 2017-11-29 DIAGNOSIS — C187 Malignant neoplasm of sigmoid colon: Secondary | ICD-10-CM

## 2017-11-29 NOTE — Telephone Encounter (Signed)
Oral Chemotherapy Pharmacist Encounter   Spoke with patient today to follow up regarding patient's oral chemotherapy medication: Xeloda (capecitabine) for the adjuvant treatment of stage II colon cancer, planned duration 6 months of Xeloda monotherapy.  Original Start date of oral chemotherapy: 08/13/17  Pt is doing well today  Pt reports 0 tablets/doses of Xeloda as prescribed. She is taking Xeloda 500 mg tablets, 4 tablets (2000 mg) by mouth 2 times daily, taken within 30 minutes of finishing food, taken for 14 days on, 7 days off, repeated every 21 days, missed in the last month. Cycle 6 started on 11/26/2017  Pt reports the following side effects:  Patient states that she is still experiencing darkening of the hands and feet  Patient states that her hands and feet are extremely dry and have not responded to moisturizing cream, urea cream, or wearing cream and gloves and socks overnight  Patient states she is starting to experience blisters in her mouth with this current cycle, and darkening of her tongue  Pertinent labs reviewed: Okay for continued treatment.  Other Issues: Patient failed to keep last appointment in the office.  Next office visit scheduled for 12/13/2017. Patient wonders if she should be seen earlier due to progressive side effects. Note will be routed to MD.  Patient knows to call the office with questions or concerns. [facility] Oncology Clinic will continue to follow.  Johny Drilling, PharmD, BCPS, BCOP  11/29/2017 11:41 AM [facility] [phone]

## 2017-12-05 MED FILL — CAPECITABINE 500 MG TABLET: 500 | 21 days supply | Qty: 112 | Fill #2

## 2017-12-06 ENCOUNTER — Telehealth: Payer: Self-pay | Admitting: *Deleted

## 2017-12-06 NOTE — Telephone Encounter (Signed)
Telephone call to patient to check on blisters in her mouth and hand and feet. She states she does not have blisters or sores. She is noticing dark spots and dry thickness. She states it is not bothering her yet. She is using creams and oils to keep them soft. She states she has a bad habit of chewing on her cheeks and lips and she is trying to stop this. These sores are the only ones she currently has. No concerns at this time. Advised patient to call this office with any questions.

## 2017-12-13 ENCOUNTER — Inpatient Hospital Stay: Payer: Medicaid Other | Attending: Oncology | Admitting: Oncology

## 2017-12-13 ENCOUNTER — Inpatient Hospital Stay: Payer: Medicaid Other

## 2017-12-13 DIAGNOSIS — C187 Malignant neoplasm of sigmoid colon: Secondary | ICD-10-CM | POA: Insufficient documentation

## 2017-12-13 DIAGNOSIS — Z86010 Personal history of colonic polyps: Secondary | ICD-10-CM | POA: Insufficient documentation

## 2017-12-13 DIAGNOSIS — Z79899 Other long term (current) drug therapy: Secondary | ICD-10-CM | POA: Insufficient documentation

## 2017-12-20 ENCOUNTER — Encounter: Payer: Self-pay | Admitting: *Deleted

## 2017-12-24 ENCOUNTER — Inpatient Hospital Stay: Payer: Medicaid Other

## 2017-12-24 ENCOUNTER — Inpatient Hospital Stay (HOSPITAL_BASED_OUTPATIENT_CLINIC_OR_DEPARTMENT_OTHER): Payer: Medicaid Other | Admitting: Nurse Practitioner

## 2017-12-24 ENCOUNTER — Telehealth: Payer: Self-pay | Admitting: Nurse Practitioner

## 2017-12-24 ENCOUNTER — Encounter: Payer: Self-pay | Admitting: Nurse Practitioner

## 2017-12-24 VITALS — BP 113/77 | HR 78 | Temp 98.6°F | Resp 18 | Ht 63.0 in | Wt 187.5 lb

## 2017-12-24 DIAGNOSIS — C187 Malignant neoplasm of sigmoid colon: Secondary | ICD-10-CM

## 2017-12-24 DIAGNOSIS — Z79899 Other long term (current) drug therapy: Secondary | ICD-10-CM

## 2017-12-24 DIAGNOSIS — Z86010 Personal history of colonic polyps: Secondary | ICD-10-CM | POA: Diagnosis not present

## 2017-12-24 LAB — CMP (CANCER CENTER ONLY)
ALT: 20 U/L (ref 0–44)
ANION GAP: 8 (ref 5–15)
AST: 20 U/L (ref 15–41)
Albumin: 3.8 g/dL (ref 3.5–5.0)
Alkaline Phosphatase: 53 U/L (ref 38–126)
BUN: 15 mg/dL (ref 6–20)
CO2: 23 mmol/L (ref 22–32)
CREATININE: 1.13 mg/dL — AB (ref 0.44–1.00)
Calcium: 8.8 mg/dL — ABNORMAL LOW (ref 8.9–10.3)
Chloride: 108 mmol/L (ref 98–111)
Glucose, Bld: 122 mg/dL — ABNORMAL HIGH (ref 70–99)
Potassium: 3.2 mmol/L — ABNORMAL LOW (ref 3.5–5.1)
SODIUM: 139 mmol/L (ref 135–145)
Total Bilirubin: 1.1 mg/dL (ref 0.3–1.2)
Total Protein: 7.2 g/dL (ref 6.5–8.1)

## 2017-12-24 LAB — CBC WITH DIFFERENTIAL (CANCER CENTER ONLY)
Abs Immature Granulocytes: 0.02 10*3/uL (ref 0.00–0.07)
BASOS PCT: 0 %
Basophils Absolute: 0 10*3/uL (ref 0.0–0.1)
EOS ABS: 0.1 10*3/uL (ref 0.0–0.5)
Eosinophils Relative: 1 %
HEMATOCRIT: 34.3 % — AB (ref 36.0–46.0)
Hemoglobin: 11.3 g/dL — ABNORMAL LOW (ref 12.0–15.0)
IMMATURE GRANULOCYTES: 0 %
LYMPHS ABS: 2 10*3/uL (ref 0.7–4.0)
Lymphocytes Relative: 32 %
MCH: 33.2 pg (ref 26.0–34.0)
MCHC: 32.9 g/dL (ref 30.0–36.0)
MCV: 100.9 fL — ABNORMAL HIGH (ref 80.0–100.0)
MONO ABS: 0.4 10*3/uL (ref 0.1–1.0)
Monocytes Relative: 6 %
NEUTROS PCT: 61 %
NRBC: 0 % (ref 0.0–0.2)
Neutro Abs: 3.7 10*3/uL (ref 1.7–7.7)
PLATELETS: 254 10*3/uL (ref 150–400)
RBC: 3.4 MIL/uL — ABNORMAL LOW (ref 3.87–5.11)
RDW: 14.8 % (ref 11.5–15.5)
WBC: 6.3 10*3/uL (ref 4.0–10.5)

## 2017-12-24 NOTE — Progress Notes (Addendum)
  [place] OFFICE PROGRESS NOTE   Diagnosis: Colon cancer  INTERVAL HISTORY:   Ms. Lozon returns for follow-up.  She was last seen at the [facility] 11/09/2017 at which time she was completing cycle 5 adjuvant Xeloda.  She has missed several follow-up appointments.  She began cycle 7 Xeloda 12/17/2017.  She denies nausea/vomiting.  She thinks she "burned" the roof of her mouth with food.  The pain is resolving.  She does not think she has mouth sores.  Soles and palms without redness.  She had some pain on the palms which improved when she began applying lotion.  She notes dryness over the palms as well as hyperpigmentation.  Objective:  Vital signs in last 24 hours:  Blood pressure 113/77, pulse 78, temperature 98.6 F (37 C), temperature source Oral, resp. rate 18, height '5\' 3"'$  (1.6 m), weight 187 lb 8 oz (85 kg), SpO2 100 %.    HEENT: Single area of ulceration at the hard palate.  No thrush. Resp: Lungs clear bilaterally. Cardio: Regular rate and rhythm. GI: Abdomen soft and nontender.  No hepatomegaly. Vascular: No leg edema.  Skin: Palms with skin thickening, hyperpigmentation.  No skin breakdown.   Lab Results:  Lab Results  Component Value Date   WBC 6.3 12/24/2017   HGB 11.3 (L) 12/24/2017   HCT 34.3 (L) 12/24/2017   MCV 100.9 (H) 12/24/2017   PLT 254 12/24/2017   NEUTROABS 3.7 12/24/2017    Imaging:  No results found.  Medications: I have reviewed the patient's current medications.  Assessment/Plan: 1. 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 0/25 lymph nodes positive ? Tumor penetrated through the muscularis to within a cell of the serosal surface per personal discussion with the pathologist, therefore clinical impression is of an early T4 lesion ? Cycle 1 adjuvant Xeloda  08/13/2017 ? Cycle 2 adjuvant Xeloda 09/03/2017 ? Cycle 3 adjuvantXeloda 09/24/2017 ? Cycle 4adjuvant Xeloda 10/15/2017 ? Cycle 5 adjuvant Xeloda 11/04/2017 ? Cycle 6 adjuvant Xeloda 11/26/2017 ? Cycle 7 adjuvant Xeloda 12/17/2017 2. Transverse colon polyp noted on colonoscopy 03/13/2017, tubular adenoma  Disposition: Ms. Koeller appears stable.  She is completing cycle 7 adjuvant Xeloda.  Overall she seems to be tolerating treatment well.  We reviewed the lab work from today.  She has mild hypokalemia.  She does not want to begin a potassium supplement.  She will work on dietary maneuvers to increase the potassium level.  She will return for lab and follow-up on 01/07/2018.  She will contact the office in the interim with any problems.  Patient seen with Dr. Benay Spice.    [name] ANP/GNP-BC   12/24/2017  2:44 PM This was a shared visit with [name].  Ms. Kain is tolerating the Xeloda well.  She will continue Xeloda on the current schedule.  She will be scheduled for an office visit prior to beginning cycle 8.  Julieanne Manson, MD

## 2017-12-24 NOTE — Telephone Encounter (Signed)
Gave pt avs and calendar  °

## 2018-01-02 MED FILL — CAPECITABINE 500 MG TABLET: 500 | 21 days supply | Qty: 112 | Fill #3

## 2018-01-07 ENCOUNTER — Inpatient Hospital Stay: Payer: Medicaid Other | Admitting: Nurse Practitioner

## 2018-01-07 ENCOUNTER — Inpatient Hospital Stay: Payer: Medicaid Other | Attending: Oncology

## 2018-01-07 DIAGNOSIS — Z86010 Personal history of colonic polyps: Secondary | ICD-10-CM | POA: Insufficient documentation

## 2018-01-07 DIAGNOSIS — Z9221 Personal history of antineoplastic chemotherapy: Secondary | ICD-10-CM | POA: Insufficient documentation

## 2018-01-07 DIAGNOSIS — C187 Malignant neoplasm of sigmoid colon: Secondary | ICD-10-CM | POA: Insufficient documentation

## 2018-01-07 DIAGNOSIS — R234 Changes in skin texture: Secondary | ICD-10-CM | POA: Insufficient documentation

## 2018-01-08 ENCOUNTER — Telehealth: Payer: Self-pay | Admitting: Oncology

## 2018-01-08 NOTE — Telephone Encounter (Signed)
Rescheduled patient appointment per schedule message.  Mailed appointment  Calendar for Nov 2019

## 2018-01-09 ENCOUNTER — Telehealth: Payer: Self-pay

## 2018-01-09 NOTE — Telephone Encounter (Signed)
Per patient requested to reschedule appointment. Per 11/13 vm return calls

## 2018-01-10 ENCOUNTER — Inpatient Hospital Stay (HOSPITAL_BASED_OUTPATIENT_CLINIC_OR_DEPARTMENT_OTHER): Payer: Medicaid Other | Admitting: Oncology

## 2018-01-10 ENCOUNTER — Telehealth: Payer: Self-pay | Admitting: Oncology

## 2018-01-10 ENCOUNTER — Inpatient Hospital Stay: Payer: Medicaid Other

## 2018-01-10 VITALS — BP 118/70 | HR 71 | Temp 98.4°F | Resp 19 | Ht 63.0 in | Wt 191.5 lb

## 2018-01-10 DIAGNOSIS — C187 Malignant neoplasm of sigmoid colon: Secondary | ICD-10-CM

## 2018-01-10 DIAGNOSIS — Z86010 Personal history of colonic polyps: Secondary | ICD-10-CM

## 2018-01-10 DIAGNOSIS — R234 Changes in skin texture: Secondary | ICD-10-CM

## 2018-01-10 DIAGNOSIS — Z9221 Personal history of antineoplastic chemotherapy: Secondary | ICD-10-CM | POA: Diagnosis not present

## 2018-01-10 LAB — CBC WITH DIFFERENTIAL (CANCER CENTER ONLY)
Abs Immature Granulocytes: 0.02 10*3/uL (ref 0.00–0.07)
BASOS ABS: 0 10*3/uL (ref 0.0–0.1)
Basophils Relative: 1 %
EOS ABS: 0.1 10*3/uL (ref 0.0–0.5)
Eosinophils Relative: 1 %
HCT: 37.4 % (ref 36.0–46.0)
Hemoglobin: 12.4 g/dL (ref 12.0–15.0)
IMMATURE GRANULOCYTES: 0 %
LYMPHS PCT: 40 %
Lymphs Abs: 2.5 10*3/uL (ref 0.7–4.0)
MCH: 33.1 pg (ref 26.0–34.0)
MCHC: 33.2 g/dL (ref 30.0–36.0)
MCV: 99.7 fL (ref 80.0–100.0)
Monocytes Absolute: 0.4 10*3/uL (ref 0.1–1.0)
Monocytes Relative: 6 %
NEUTROS ABS: 3.3 10*3/uL (ref 1.7–7.7)
NEUTROS PCT: 52 %
NRBC: 0 % (ref 0.0–0.2)
Platelet Count: 295 10*3/uL (ref 150–400)
RBC: 3.75 MIL/uL — ABNORMAL LOW (ref 3.87–5.11)
RDW: 14.7 % (ref 11.5–15.5)
WBC Count: 6.2 10*3/uL (ref 4.0–10.5)

## 2018-01-10 LAB — CMP (CANCER CENTER ONLY)
ALT: 20 U/L (ref 0–44)
ANION GAP: 9 (ref 5–15)
AST: 16 U/L (ref 15–41)
Albumin: 4.2 g/dL (ref 3.5–5.0)
Alkaline Phosphatase: 54 U/L (ref 38–126)
BUN: 15 mg/dL (ref 6–20)
CALCIUM: 9.4 mg/dL (ref 8.9–10.3)
CHLORIDE: 106 mmol/L (ref 98–111)
CO2: 25 mmol/L (ref 22–32)
Creatinine: 1.12 mg/dL — ABNORMAL HIGH (ref 0.44–1.00)
GFR, Estimated: >60 mL/min (ref >60)
Glucose, Bld: 95 mg/dL (ref 70–99)
POTASSIUM: 3.5 mmol/L (ref 3.5–5.1)
SODIUM: 140 mmol/L (ref 135–145)
Total Bilirubin: 0.8 mg/dL (ref 0.3–1.2)
Total Protein: 7.9 g/dL (ref 6.5–8.1)

## 2018-01-10 LAB — CEA (IN HOUSE-CHCC): CEA (CHCC-IN HOUSE): 2.54 ng/mL (ref 0.00–5.00)

## 2018-01-10 NOTE — Telephone Encounter (Signed)
Gave pt avs and calendar  °

## 2018-01-10 NOTE — Progress Notes (Signed)
  [place] OFFICE PROGRESS NOTE   Diagnosis: Colon cancer  INTERVAL HISTORY:   Katie Reed began the current cycle of adjuvant Xeloda 01/07/2018.  No mouth sores, diarrhea, or hand/foot pain.  She continues to have hyperpigmentation and skin thickening at the hands and feet.  She wears gloves with moisturizer on the hands.  Objective:  Vital signs in last 24 hours:  Blood pressure 118/70, pulse 71, temperature 98.4 F (36.9 C), temperature source Oral, resp. rate 19, height _0  (1.6 m), weight 191 lb 8 oz (86.9 kg), SpO2 100 %.    HEENT: No thrush or ulcers Resp: Lungs clear bilaterally Cardio: Regular rate and rhythm GI: No hepatomegaly, nontender Vascular: No leg edema  Skin: Skin thickening and hyperpigmentation of the hands and feet, no erythema or skin breakdown    Lab Results:  Lab Results  Component Value Date   WBC 6.2 01/10/2018   HGB 12.4 01/10/2018   HCT 37.4 01/10/2018   MCV 99.7 01/10/2018   PLT 295 01/10/2018   NEUTROABS 3.3 01/10/2018    CMP  Lab Results  Component Value Date   NA 140 01/10/2018   K 3.5 01/10/2018   CL 106 01/10/2018   CO2 25 01/10/2018   GLUCOSE 95 01/10/2018   BUN 15 01/10/2018   CREATININE 1.12 (H) 01/10/2018   CALCIUM 9.4 01/10/2018   PROT 7.9 01/10/2018   ALBUMIN 4.2 01/10/2018   AST 16 01/10/2018   ALT 20 01/10/2018   ALKPHOS 54 01/10/2018   BILITOT 0.8 01/10/2018   GFRNONAA >60 01/10/2018   GFRAA >60 01/10/2018    Lab Results  Component Value Date   CEA1 2.18 11/09/2017     Medications: I have reviewed the patient's current medications.   Assessment/Plan: 1. 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 0/25 lymph nodes positive ? Tumor penetrated through the muscularis to within a cell of the serosal surface per personal discussion with the  pathologist, therefore clinical impression is of an early T4 lesion ? Cycle 1 adjuvant Xeloda 08/13/2017 ? Cycle 2 adjuvant Xeloda 09/03/2017 ? Cycle 3 adjuvantXeloda 09/24/2017 ? Cycle 4adjuvant Xeloda 10/15/2017 ? Cycle 5 adjuvant Xeloda 11/04/2017 ? Cycle 6 adjuvant Xeloda 11/26/2017 ? Cycle 7 adjuvant Xeloda 12/17/2017  ? Cycle 8 adjuvant Xeloda 01/07/2018 2. Transverse colon polyp noted on colonoscopy 03/13/2017, tubular adenoma  Disposition: Katie Reed is completing the final cycle of adjuvant Xeloda.  She is tolerating the Xeloda well aside from skin thickening and hyperpigmentation of the hands and feet.  She will complete this cycle of Xeloda on 01/20/2018.  She will return for an office and lab visit in approximately 5 weeks.  15 minutes were spent with the patient today.  The majority of the time was used for counseling and coordination of care.  Betsy Coder, MD  01/10/2018  11:24 AM

## 2018-01-17 ENCOUNTER — Ambulatory Visit: Payer: Medicaid Other | Admitting: Nurse Practitioner

## 2018-01-17 ENCOUNTER — Other Ambulatory Visit: Payer: Medicaid Other

## 2018-02-14 ENCOUNTER — Inpatient Hospital Stay: Payer: Medicaid Other | Admitting: Nurse Practitioner

## 2018-02-14 ENCOUNTER — Inpatient Hospital Stay: Payer: Medicaid Other | Attending: Oncology

## 2018-03-11 ENCOUNTER — Telehealth: Payer: Self-pay | Admitting: Nurse Practitioner

## 2018-03-11 NOTE — Telephone Encounter (Signed)
Tried to reach regarding voicemail I did leave a message °

## 2018-03-20 ENCOUNTER — Inpatient Hospital Stay: Payer: Medicaid Other | Attending: Oncology

## 2018-03-20 ENCOUNTER — Inpatient Hospital Stay (HOSPITAL_BASED_OUTPATIENT_CLINIC_OR_DEPARTMENT_OTHER): Payer: Medicaid Other | Admitting: Nurse Practitioner

## 2018-03-20 ENCOUNTER — Encounter: Payer: Self-pay | Admitting: Nurse Practitioner

## 2018-03-20 VITALS — BP 125/80 | HR 82 | Temp 99.0°F | Resp 18 | Ht 63.0 in | Wt 187.7 lb

## 2018-03-20 DIAGNOSIS — C187 Malignant neoplasm of sigmoid colon: Secondary | ICD-10-CM

## 2018-03-20 DIAGNOSIS — Z85038 Personal history of other malignant neoplasm of large intestine: Secondary | ICD-10-CM | POA: Insufficient documentation

## 2018-03-20 DIAGNOSIS — Z86010 Personal history of colonic polyps: Secondary | ICD-10-CM | POA: Diagnosis not present

## 2018-03-20 DIAGNOSIS — Z9221 Personal history of antineoplastic chemotherapy: Secondary | ICD-10-CM | POA: Insufficient documentation

## 2018-03-20 LAB — CMP (CANCER CENTER ONLY)
ALT: 41 U/L (ref 0–44)
AST: 30 U/L (ref 15–41)
Albumin: 4.4 g/dL (ref 3.5–5.0)
Alkaline Phosphatase: 55 U/L (ref 38–126)
Anion gap: 13 (ref 5–15)
BUN: 13 mg/dL (ref 6–20)
CO2: 22 mmol/L (ref 22–32)
Calcium: 9.7 mg/dL (ref 8.9–10.3)
Chloride: 106 mmol/L (ref 98–111)
Creatinine: 0.93 mg/dL (ref 0.44–1.00)
GFR, Estimated: >60 mL/min (ref >60)
Glucose, Bld: 96 mg/dL (ref 70–99)
Potassium: 3.5 mmol/L (ref 3.5–5.1)
Sodium: 141 mmol/L (ref 135–145)
TOTAL PROTEIN: 7.8 g/dL (ref 6.5–8.1)
Total Bilirubin: 0.5 mg/dL (ref 0.3–1.2)

## 2018-03-20 LAB — CBC WITH DIFFERENTIAL (CANCER CENTER ONLY)
Abs Immature Granulocytes: 0.04 10*3/uL (ref 0.00–0.07)
BASOS PCT: 1 %
Basophils Absolute: 0.1 10*3/uL (ref 0.0–0.1)
Eosinophils Absolute: 0 10*3/uL (ref 0.0–0.5)
Eosinophils Relative: 0 %
HCT: 36.2 % (ref 36.0–46.0)
Hemoglobin: 12.2 g/dL (ref 12.0–15.0)
Immature Granulocytes: 0 %
Lymphocytes Relative: 29 %
Lymphs Abs: 3.4 10*3/uL (ref 0.7–4.0)
MCH: 31.4 pg (ref 26.0–34.0)
MCHC: 33.7 g/dL (ref 30.0–36.0)
MCV: 93.3 fL (ref 80.0–100.0)
Monocytes Absolute: 0.6 10*3/uL (ref 0.1–1.0)
Monocytes Relative: 5 %
Neutro Abs: 7.4 10*3/uL (ref 1.7–7.7)
Neutrophils Relative %: 65 %
PLATELETS: 370 10*3/uL (ref 150–400)
RBC: 3.88 MIL/uL (ref 3.87–5.11)
RDW: 13.1 % (ref 11.5–15.5)
WBC Count: 11.5 10*3/uL — ABNORMAL HIGH (ref 4.0–10.5)
nRBC: 0 % (ref 0.0–0.2)

## 2018-03-20 NOTE — Progress Notes (Addendum)
  [place] OFFICE PROGRESS NOTE   Diagnosis: Colon cancer  INTERVAL HISTORY:   Katie Reed returns for follow-up.  She completed the fifth and final cycle of adjuvant Xeloda beginning 01/07/2018.  She overall feels well.  No nausea or vomiting.  No mouth sores.  No diarrhea.  No hand or foot pain or redness.  Objective:  Vital signs in last 24 hours:  Blood pressure 125/80, pulse 82, temperature 99 F (37.2 C), temperature source Oral, resp. rate 18, height _0  (1.6 m), weight 187 lb 11.2 oz (85.1 kg), SpO2 99 %.    HEENT: Neck without mass. Lymphatics: No palpable cervical, supraclavicular, axillary or inguinal lymph nodes. Resp: Lungs clear bilaterally. Cardio: Regular rate and rhythm. GI: Abdomen soft and nontender.  No hepatomegaly.  No mass. Vascular: No leg edema.  Skin: Palms without erythema.   Lab Results:  Lab Results  Component Value Date   WBC 11.5 (H) 03/20/2018   HGB 12.2 03/20/2018   HCT 36.2 03/20/2018   MCV 93.3 03/20/2018   PLT 370 03/20/2018   NEUTROABS 7.4 03/20/2018    Imaging:  No results found.  Medications: I have reviewed the patient's current medications.  Assessment/Plan: 1. 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 0/25 lymph nodes positive ? Tumor penetrated through the muscularis to within a cell of the serosal surface per personal discussion with the pathologist, therefore clinical impression is of anearly T4 lesion ? Cycle 1 adjuvant Xeloda 08/13/2017 ? Cycle 2 adjuvant Xeloda 09/03/2017 ? Cycle 3 adjuvantXeloda 09/24/2017 ? Cycle 4adjuvant Xeloda 10/15/2017 ? Cycle 5 adjuvant Xeloda 11/04/2017 ? Cycle 6 adjuvant Xeloda 11/26/2017 ? Cycle 7 adjuvant Xeloda 12/17/2017  ? Cycle 8 adjuvant Xeloda 01/07/2018 2. Transverse colon polyp noted on colonoscopy 03/13/2017,  tubular adenoma   Disposition: Katie Reed completed the course of adjuvant Xeloda chemotherapy in November 2019.  She is in clinical remission from colon cancer.  She will undergo surveillance CT scans in approximately 4 months.  We made a referral to gastroenterology to schedule a 1 year colonoscopy also in approximately 4 months.  We will see her in follow-up a few days after the CT scans.  She will contact the office in the interim with any problems.  Patient seen with Dr. Benay Spice.    [name] ANP/GNP-BC   03/20/2018  1:37 PM  This was a shared visit with [name].  Katie Reed is in clinical remission from colon cancer.  We discussed the surveillance plan with her.  She will be referred for a surveillance colonoscopy and restaging CT scans.  Julieanne Manson, MD

## 2018-03-21 ENCOUNTER — Telehealth: Payer: Self-pay | Admitting: Oncology

## 2018-03-21 NOTE — Telephone Encounter (Signed)
Scheduled appt per 1/22 los - sent reminder letter in the mail with appt date and time

## 2018-03-27 ENCOUNTER — Encounter: Payer: Self-pay | Admitting: Gastroenterology

## 2018-04-16 ENCOUNTER — Ambulatory Visit: Payer: Medicaid Other | Admitting: Gastroenterology

## 2018-04-22 ENCOUNTER — Encounter: Payer: Self-pay | Admitting: Gastroenterology

## 2018-05-16 ENCOUNTER — Telehealth: Payer: Self-pay

## 2018-05-16 NOTE — Telephone Encounter (Signed)
Covid-19 travel screening questions  Have you traveled in the last 14 days? If yes where?  Do you now or have you had a fever in the last 14 days?  Do you have any respiratory symptoms of shortness of breath or cough now or in the last 14 days?   Do you have any family members or close contacts with diagnosed or suspected Covid-19?  Message left on patients voice mail with all screening questions and if yes to any of the questions the pt is asked to call and reschedule the appointment.

## 2018-05-17 ENCOUNTER — Other Ambulatory Visit: Payer: Self-pay

## 2018-05-17 ENCOUNTER — Encounter: Payer: Self-pay | Admitting: Gastroenterology

## 2018-05-17 ENCOUNTER — Encounter (INDEPENDENT_AMBULATORY_CARE_PROVIDER_SITE_OTHER): Payer: Self-pay

## 2018-05-17 ENCOUNTER — Ambulatory Visit: Payer: Medicaid Other | Admitting: Gastroenterology

## 2018-05-17 VITALS — BP 106/70 | HR 82 | Temp 98.2°F | Ht 63.0 in | Wt 190.0 lb

## 2018-05-17 DIAGNOSIS — C187 Malignant neoplasm of sigmoid colon: Secondary | ICD-10-CM

## 2018-05-17 NOTE — Patient Instructions (Addendum)
We will get records sent from your previous gastroenterologist [facility] Dr. Rosalie Gums)  for review.  This will include any endoscopic (colonoscopy or upper endoscopy) procedures and any associated pathology reports.     June 2020 colonoscopy for personal history of colon cancer.  To help prevent the possible spread of infection to our patients, communities, and staff; we will be implementing the following measures:  Please only allow one visitor/family member to accompany you to any upcoming appointments with [facility]. If you have any concerns about this please contact our office to discuss prior to the appointment.   Thank you for entrusting me with your care and choosing [facility].  Dr Ardis Hughs

## 2018-05-17 NOTE — Progress Notes (Signed)
S  HPI: This is a very pleasant 38 year old woman who was referred to me by Illene Regulus, MD  Chief complaint is personal history of colon cancer  She was diagnosed with a sigmoid colon cancer in late 2018, eventually underwent segmental colectomy.  See her oncologic summary below.  She has done very well since that surgery.  She tolerated adjuvant Xeloda without much difficulty.  Her bowels are really back to normal.  She sees no overt GI bleeding.  She has been gaining weight.  She has no significant abdominal pains.  She tells me 3 family members have recently been diagnosed with colon cancer  She believes she had a full colonoscopy by [facility] either before or after her sigmoidoscopy.   Old Data Reviewed: She had a stage IIb well-differentiated adenocarcinoma of the sigmoid colon.  This was diagnosed by [facility] gastroenterology in 01/2017 during a flexible sigmoidoscopy.  She underwent robot-assisted sigmoidectomy May 2019.  Pathology showed MSI stable no loss of mismatch repair.  0+ nodes.  She she had technically a T4a tumor (as the tumor penetrated through the muscularis to "within a cell of the serosal surface" and so she completed adjuvant Xeloda chemotherapy.   Review of systems: Pertinent positive and negative review of systems were noted in the above HPI section. All other review negative.   Past Medical History:  Diagnosis Date  . Anxiety   . Colon cancer ([place])   . Depression   . Preterm labor     Past Surgical History:  Procedure Laterality Date  . FLEXIBLE SIGMOIDOSCOPY N/A 02/19/2017   Procedure: FLEXIBLE SIGMOIDOSCOPY;  Surgeon: Otis Brace, MD;  Location: [facility] ENDOSCOPY;  Service: Gastroenterology;  Laterality: N/A;  . NO PAST SURGERIES    . THERAPEUTIC ABORTION     elective    Current Outpatient Medications  Medication Sig Dispense Refill  . Prenatal Vit-Fe Fumarate-FA (PRENATAL VITAMINS PO) Take by mouth daily.     No current facility-administered  medications for this visit.     Allergies as of 05/17/2018  . (No Known Allergies)    Family History  Problem Relation Age of Onset  . Hypertension Mother   . Colon cancer Mother   . Colon cancer Sister   . Alcohol abuse Neg Hx   . Arthritis Neg Hx   . Asthma Neg Hx   . Birth defects Neg Hx   . Cancer Neg Hx   . COPD Neg Hx   . Depression Neg Hx   . Diabetes Neg Hx   . Drug abuse Neg Hx   . Early death Neg Hx   . Hearing loss Neg Hx   . Heart disease Neg Hx   . Hyperlipidemia Neg Hx   . Kidney disease Neg Hx   . Learning disabilities Neg Hx   . Mental illness Neg Hx   . Mental retardation Neg Hx   . Miscarriages / Stillbirths Neg Hx   . Stroke Neg Hx   . Vision loss Neg Hx   . Varicose Veins Neg Hx     Social History   Socioeconomic History  . Marital status: Married    Spouse name: Not on file  . Number of children: 2  . Years of education: Not on file  . Highest education level: Not on file  Occupational History  . Occupation: stay at home mom  Social Needs  . Financial resource strain: Not on file  . Food insecurity:    Worry: Not on file  Inability: Not on file  . Transportation needs:    Medical: Not on file    Non-medical: Not on file  Tobacco Use  . Smoking status: Current Some Day Smoker    Packs/day: 0.25    Years: 14.00    Pack years: 3.50    Types: Cigarettes  . Smokeless tobacco: Never Used  . Tobacco comment: 1-2 cigarettes a day  Substance and Sexual Activity  . Alcohol use: Yes    Alcohol/week: 1.0 standard drinks    Types: 1 Glasses of wine per week    Comment: 1-2 times per week  . Drug use: No  . Sexual activity: Yes    Birth control/protection: None  Lifestyle  . Physical activity:    Days per week: Not on file    Minutes per session: Not on file  . Stress: Not on file  Relationships  . Social connections:    Talks on phone: Not on file    Gets together: Not on file    Attends religious service: Not on file    Active  member of club or organization: Not on file    Attends meetings of clubs or organizations: Not on file    Relationship status: Not on file  . Intimate partner violence:    Fear of current or ex partner: Not on file    Emotionally abused: Not on file    Physically abused: Not on file    Forced sexual activity: Not on file  Other Topics Concern  . Not on file  Social History Narrative  . Not on file     Physical Exam: BP 106/70   Pulse 82   Temp 98.2 F (36.8 C)   Ht 5' 3"  (1.6 m)   Wt 190 lb (86.2 kg)   BMI 33.66 kg/m  Constitutional: generally well-appearing Psychiatric: alert and oriented x3 Eyes: extraocular movements intact Mouth: oral pharynx moist, no lesions Neck: supple no lymphadenopathy Cardiovascular: heart regular rate and rhythm Lungs: clear to auscultation bilaterally Abdomen: soft, nontender, nondistended, no obvious ascites, no peritoneal signs, normal bowel sounds Extremities: no lower extremity edema bilaterally Skin: no lesions on visible extremities   Assessment and plan: 38 y.o. female with personal history of colon cancer  She will be due for 1 year surveillance colonoscopy in early June 2020.  We will make sure to get her back here that month.  Certainly no reason to be done sooner based on how well she is doing clinically.  She believes she had a full colonoscopy either before or after her sigmoidoscopy also by [facility].  We will Reach Out for those records.     Please see the "Patient Instructions" section for addition details about the plan.   Owens Loffler, MD [facility] 05/17/2018, 3:01 PM  Cc: Julieanne Manson, MD

## 2018-06-18 ENCOUNTER — Telehealth: Payer: Self-pay | Admitting: Gastroenterology

## 2018-06-18 NOTE — Telephone Encounter (Signed)
   Colonoscopy January 2019, Dr. Alessandra Bevels from [facility] gastroenterology.  Indication "personal history of adenoma with high-grade dysplasia on flexible sigmoidoscopy December 2018".  The colon was complete to the cecum with pictures of the IC valve, appendiceal orifice and the rectum.  He documented that the procedure was technically difficult due to partially obstructing sigmoid mass.  He removed one polyp from the transverse colon it was a tubular adenoma, 4 mm.  He also biopsied a "medium submucosal lesion in the rectum.  Biopsies were taken and these showed reactive changes, negative for colitis or dysplasia."    Let her know I reviewed her previous full colonoscopy from [facility] and as I had planned previously she needs recall colonoscopy June 2020

## 2018-06-18 NOTE — Telephone Encounter (Signed)
Patient notified. Recall colonoscopy for June 2020 placed

## 2018-07-11 ENCOUNTER — Other Ambulatory Visit: Payer: Self-pay | Admitting: Gastroenterology

## 2018-07-11 DIAGNOSIS — C187 Malignant neoplasm of sigmoid colon: Secondary | ICD-10-CM

## 2018-07-11 MED ORDER — PEG 3350-KCL-NA BICARB-NACL 420 G PO SOLR: 4000.0000 mL | ORAL | 0 refills | Status: DC

## 2018-07-15 ENCOUNTER — Inpatient Hospital Stay: Payer: Medicaid Other

## 2018-07-15 ENCOUNTER — Telehealth: Payer: Self-pay | Admitting: Oncology

## 2018-07-15 NOTE — Telephone Encounter (Signed)
R/s appt per 5/18 sch message - unable to reach patient .left message with app date and time

## 2018-07-17 ENCOUNTER — Inpatient Hospital Stay: Payer: Medicaid Other | Attending: Oncology

## 2018-07-17 ENCOUNTER — Ambulatory Visit (HOSPITAL_COMMUNITY): Admission: RE | Admit: 2018-07-17 | Payer: Medicaid Other | Source: Ambulatory Visit

## 2018-07-17 ENCOUNTER — Other Ambulatory Visit: Payer: Self-pay

## 2018-07-17 DIAGNOSIS — C187 Malignant neoplasm of sigmoid colon: Secondary | ICD-10-CM | POA: Diagnosis present

## 2018-07-17 LAB — CBC WITH DIFFERENTIAL (CANCER CENTER ONLY)
Abs Immature Granulocytes: 0.01 10*3/uL (ref 0.00–0.07)
Basophils Absolute: 0.1 10*3/uL (ref 0.0–0.1)
Basophils Relative: 1 %
Eosinophils Absolute: 0.2 10*3/uL (ref 0.0–0.5)
Eosinophils Relative: 4 %
HCT: 37.3 % (ref 36.0–46.0)
Hemoglobin: 12.2 g/dL (ref 12.0–15.0)
Immature Granulocytes: 0 %
Lymphocytes Relative: 44 %
Lymphs Abs: 1.9 10*3/uL (ref 0.7–4.0)
MCH: 30 pg (ref 26.0–34.0)
MCHC: 32.7 g/dL (ref 30.0–36.0)
MCV: 91.6 fL (ref 80.0–100.0)
Monocytes Absolute: 0.3 10*3/uL (ref 0.1–1.0)
Monocytes Relative: 7 %
Neutro Abs: 1.9 10*3/uL (ref 1.7–7.7)
Neutrophils Relative %: 44 %
Platelet Count: 364 10*3/uL (ref 150–400)
RBC: 4.07 MIL/uL (ref 3.87–5.11)
RDW: 13.2 % (ref 11.5–15.5)
WBC Count: 4.3 10*3/uL (ref 4.0–10.5)
nRBC: 0 % (ref 0.0–0.2)

## 2018-07-17 LAB — CMP (CANCER CENTER ONLY)
ALT: 27 U/L (ref 0–44)
AST: 17 U/L (ref 15–41)
Albumin: 4.1 g/dL (ref 3.5–5.0)
Alkaline Phosphatase: 46 U/L (ref 38–126)
Anion gap: 8 (ref 5–15)
BUN: 13 mg/dL (ref 6–20)
CO2: 25 mmol/L (ref 22–32)
Calcium: 9.1 mg/dL (ref 8.9–10.3)
Chloride: 106 mmol/L (ref 98–111)
Creatinine: 1.03 mg/dL — ABNORMAL HIGH (ref 0.44–1.00)
GFR, Est AFR Am: >60 mL/min
GFR, Estimated: >60 mL/min
Glucose, Bld: 100 mg/dL — ABNORMAL HIGH (ref 70–99)
Potassium: 4.2 mmol/L (ref 3.5–5.1)
Sodium: 139 mmol/L (ref 135–145)
Total Bilirubin: 0.4 mg/dL (ref 0.3–1.2)
Total Protein: 7.5 g/dL (ref 6.5–8.1)

## 2018-07-17 LAB — CEA (IN HOUSE-CHCC): CEA (CHCC-In House): 2.09 ng/mL (ref 0.00–5.00)

## 2018-07-18 ENCOUNTER — Inpatient Hospital Stay: Payer: Medicaid Other | Admitting: Oncology

## 2018-07-23 ENCOUNTER — Ambulatory Visit (HOSPITAL_COMMUNITY): Payer: Medicaid Other

## 2018-07-25 ENCOUNTER — Other Ambulatory Visit: Payer: Self-pay

## 2018-07-25 ENCOUNTER — Encounter (HOSPITAL_COMMUNITY): Payer: Self-pay | Admitting: Radiology

## 2018-07-25 ENCOUNTER — Ambulatory Visit (HOSPITAL_COMMUNITY)
Admission: RE | Admit: 2018-07-25 | Discharge: 2018-07-25 | Disposition: A | Discharge status: Home / Self Care | Payer: Medicaid Other | Source: Ambulatory Visit | Attending: Nurse Practitioner | Admitting: Nurse Practitioner

## 2018-07-25 ENCOUNTER — Telehealth: Payer: Self-pay

## 2018-07-25 ENCOUNTER — Other Ambulatory Visit: Payer: Self-pay | Admitting: Gastroenterology

## 2018-07-25 ENCOUNTER — Telehealth: Payer: Self-pay | Admitting: Gastroenterology

## 2018-07-25 DIAGNOSIS — C187 Malignant neoplasm of sigmoid colon: Secondary | ICD-10-CM | POA: Diagnosis not present

## 2018-07-25 MED ORDER — IOHEXOL 300 MG/ML  SOLN
100.0000 mL | Freq: Once | INTRAMUSCULAR | Status: AC | PRN
  Administered 2018-07-25: 11:00:00 100 mL via INTRAVENOUS

## 2018-07-25 MED ORDER — PEG 3350-KCL-NA BICARB-NACL 420 G PO SOLR: 4000.0000 mL | ORAL | 0 refills | Status: DC

## 2018-07-25 MED ORDER — IOHEXOL 300 MG/ML  SOLN
30.0000 mL | Freq: Once | INTRAMUSCULAR | Status: AC | PRN
  Administered 2018-07-25: 09:00:00 30 mL via ORAL

## 2018-07-25 MED ORDER — SODIUM CHLORIDE (PF) 0.9 % IJ SOLN
INTRAMUSCULAR | Status: AC
  Filled 2018-07-25: qty 50

## 2018-07-25 NOTE — Telephone Encounter (Signed)
I spoke to patient today who stated she drank her Golytely prep instead of her contrast for her CT that was scheduled a few days ago. A new prescription for Jaci Carrel has been sent to her pharmacy. Patient will pick it up today.

## 2018-07-25 NOTE — Telephone Encounter (Signed)
Patient notified. See encounter note.

## 2018-07-25 NOTE — Telephone Encounter (Signed)
Pt called in and wanted to advise the nurse that she accidentally drink 3 glasses of colon prep for another procd. and wants to know if she has to get another kit? Please call and advise thanks

## 2018-07-28 ENCOUNTER — Telehealth: Payer: Self-pay | Admitting: *Deleted

## 2018-07-28 NOTE — Telephone Encounter (Addendum)
Called to complete pre procedure screening. No answer. LMOM.  Covid-19 screening questions  Have you traveled in the last 14 days? No If yes where?  Do you now or have you had a fever in the last 14 days? No  Do you have any respiratory symptoms of shortness of breath or cough now or in the last 14 days? No  Do you have any family members or close contacts with diagnosed or suspected Covid-19 in the past 14 days? No  Have you been tested for Covid-19 and found to be positive? No

## 2018-07-30 ENCOUNTER — Other Ambulatory Visit: Payer: Self-pay

## 2018-07-30 ENCOUNTER — Encounter: Payer: Self-pay | Admitting: Gastroenterology

## 2018-07-30 ENCOUNTER — Ambulatory Visit (AMBULATORY_SURGERY_CENTER): Payer: Medicaid Other | Admitting: Gastroenterology

## 2018-07-30 VITALS — BP 110/77 | HR 59 | Temp 99.1°F | Resp 16 | Ht 63.0 in | Wt 190.0 lb

## 2018-07-30 DIAGNOSIS — C187 Malignant neoplasm of sigmoid colon: Secondary | ICD-10-CM | POA: Diagnosis not present

## 2018-07-30 DIAGNOSIS — C189 Malignant neoplasm of colon, unspecified: Secondary | ICD-10-CM | POA: Diagnosis not present

## 2018-07-30 DIAGNOSIS — D125 Benign neoplasm of sigmoid colon: Secondary | ICD-10-CM

## 2018-07-30 MED ORDER — SODIUM CHLORIDE 0.9 % IV SOLN: 500.0000 mL | Freq: Once | INTRAVENOUS | Status: DC

## 2018-07-30 NOTE — Progress Notes (Signed)
Report given to PACU, vss 

## 2018-07-30 NOTE — Op Note (Signed)
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 This was diagnosed by [facility] gastroenterology                            in 01/2017 during a flexible sigmoidoscopy. She                            underwent robot-assisted sigmoidectomy May 2019.                            Pathology showed MSI stable no loss of mismatch                            repair. 0+ nodes. She she had technically a T4a                            tumor (as the tumor penetrated through the                            muscularis to "within a cell of the serosal                            surface" and so she completed adjuvant Xeloda                            chemotherapy. Medicines:                Monitored Anesthesia Care Procedure:                Pre-Anesthesia Assessment:                           - Prior to the procedure, a History and Physical                            was performed, and patient medications and                            allergies were reviewed. The patient's tolerance of  previous anesthesia was also reviewed. The risks                            and benefits of the procedure and the sedation                            options and risks were discussed with the patient.                            All questions were answered, and informed consent                            was obtained. Prior Anticoagulants: The patient has                            taken no previous anticoagulant or antiplatelet       agents. ASA Grade Assessment: II - A patient with                            mild systemic disease. After reviewing the risks                            and benefits, the patient was deemed in                            satisfactory condition to undergo the procedure.                           After obtaining informed consent, the colonoscope                            was passed under direct vision. Throughout the                            procedure, the patient's blood pressure, pulse, and                            oxygen saturations were monitored continuously. The                            Colonoscope was introduced through the anus and                            advanced to the the cecum, identified by                            appendiceal orifice and ileocecal valve. The                            colonoscopy was performed without difficulty. The                            patient tolerated the procedure well. The quality  of the bowel preparation was good. The ileocecal                            valve, appendiceal orifice, and rectum were                            photographed. Scope In: 9:24:11 AM Scope Out: 9:34:14 AM Scope Withdrawal Time: 0 hours 8 minutes 1 second  Total Procedure Duration: 0 hours 10 minutes 3 seconds  Findings:                 A 2 mm polyp was found in the sigmoid colon. The                            polyp was sessile. The polyp was removed with a                            cold snare. Resection and retrieval were complete.                           The exam was otherwise without abnormality on                            direct and retroflexion views. Complications:            No immediate complications. Estimated blood loss:                            None. Estimated Blood Loss:     Estimated blood loss: none. Impression:               - One 2 mm polyp in the sigmoid colon, removed with                            a cold  snare. Resected and retrieved.                           - The examination was otherwise normal on direct                            and retroflexion views. Recommendation:           - Patient has a contact number available for                            emergencies. The signs and symptoms of potential                            delayed complications were discussed with the                            patient. Return to normal activities tomorrow.                            Written discharge instructions were provided to the  patient.                           - Resume previous diet.                           - Continue present medications.                           - Await path results for final recommendations                            (likely repeat in years) Milus Banister, MD 07/30/2018 9:38:06 AM This report has been signed electronically.

## 2018-07-30 NOTE — Patient Instructions (Signed)
Handout given for polyps.  YOU HAD AN ENDOSCOPIC PROCEDURE TODAY AT THE [facility]:   Refer to the procedure report that was given to you for any specific questions about what was found during the examination.  If the procedure report does not answer your questions, please call your gastroenterologist to clarify.  If you requested that your care partner not be given the details of your procedure findings, then the procedure report has been included in a sealed envelope for you to review at your convenience later.  YOU SHOULD EXPECT: Some feelings of bloating in the abdomen. Passage of more gas than usual.  Walking can help get rid of the air that was put into your GI tract during the procedure and reduce the bloating. If you had a lower endoscopy (such as a colonoscopy or flexible sigmoidoscopy) you may notice spotting of blood in your stool or on the toilet paper. If you underwent a bowel prep for your procedure, you may not have a normal bowel movement for a few days.  Please Note:  You might notice some irritation and congestion in your nose or some drainage.  This is from the oxygen used during your procedure.  There is no need for concern and it should clear up in a day or so.  SYMPTOMS TO REPORT IMMEDIATELY:   Following lower endoscopy (colonoscopy or flexible sigmoidoscopy):  Excessive amounts of blood in the stool  Significant tenderness or worsening of abdominal pains  Swelling of the abdomen that is new, acute  Fever of 100F or higher  For urgent or emergent issues, a gastroenterologist can be reached at any hour by calling [phone].   DIET:  We do recommend a small meal at first, but then you may proceed to your regular diet.  Drink plenty of fluids but you should avoid alcoholic beverages for 24 hours.  ACTIVITY:  You should plan to take it easy for the rest of today and you should NOT DRIVE or use heavy machinery until tomorrow (because of the sedation  medicines used during the test).    FOLLOW UP: Our staff will call the number listed on your records 48-72 hours following your procedure to check on you and address any questions or concerns that you may have regarding the information given to you following your procedure. If we do not reach you, we will leave a message.  We will attempt to reach you two times.  During this call, we will ask if you have developed any symptoms of COVID 19. If you develop any symptoms (ie: fever, flu-like symptoms, shortness of breath, cough etc.) before then, please call [phone].  If you test positive for Covid 19 in the 2 weeks post procedure, please call and report this information to us.    If any biopsies were taken you will be contacted by phone or by letter within the next 1-3 weeks.  Please call us at [phone] if you have not heard about the biopsies in 3 weeks.    SIGNATURES/CONFIDENTIALITY: You and/or your care partner have signed paperwork which will be entered into your electronic medical record.  These signatures attest to the fact that that the information above on your After Visit Summary has been reviewed and is understood.  Full responsibility of the confidentiality of this discharge information lies with you and/or your care-partner. 

## 2018-07-30 NOTE — Progress Notes (Signed)
Called to room to assist during endoscopic procedure.  Patient ID and intended procedure confirmed with present staff. Received instructions for my participation in the procedure from the performing physician.  

## 2018-07-31 ENCOUNTER — Telehealth: Payer: Self-pay | Admitting: *Deleted

## 2018-07-31 DIAGNOSIS — C187 Malignant neoplasm of sigmoid colon: Secondary | ICD-10-CM

## 2018-07-31 NOTE — Telephone Encounter (Signed)
-----   Message from Ladell Pier, MD sent at 07/26/2018  5:22 PM EDT ----- Please let her know that the CTs are negative for cancer, 2 mm upper lung nodule is very likely to be benign-no comparison study today, repeat noncontrast CT of the chest in 4 months, schedule office visit and CEA in 4 months ----- Message ----- From: Owens Shark, NP Sent: 07/25/2018   2:43 PM EDT To: Ladell Pier, MD  She does not have a follow-up visit scheduled.  When she moved? ----- Message ----- From: Interface, Rad Results In Sent: 07/25/2018   1:51 PM EDT To: Owens Shark, NP

## 2018-07-31 NOTE — Telephone Encounter (Signed)
Notified of CT results and MD order to do non-contrast CT chest in 4 months. She understands and agrees. Will have CEA and MD visit in 4 months after scan. Scheduling message sent.

## 2018-08-01 ENCOUNTER — Telehealth: Payer: Self-pay | Admitting: *Deleted

## 2018-08-01 ENCOUNTER — Telehealth: Payer: Self-pay | Admitting: Oncology

## 2018-08-01 NOTE — Telephone Encounter (Signed)
Scheduled appt per sch msg. Called and left msg. Mailed printout °

## 2018-08-01 NOTE — Telephone Encounter (Signed)
First attempt, left VM.  

## 2018-08-01 NOTE — Telephone Encounter (Signed)
  Follow up Call-  Call back number 07/30/2018  Post procedure Call Back phone  # [phone]  Permission to leave phone message Yes  Some recent data might be hidden     Patient questions:  Do you have a fever, pain , or abdominal swelling? No. Pain Score  0 *  Have you tolerated food without any problems? Yes.    Have you been able to return to your normal activities? Yes.    Do you have any questions about your discharge instructions: Diet   No. Medications  No. Follow up visit  No.  Do you have questions or concerns about your Care? No.  Actions: * If pain score is 4 or above: No action needed, pain <4.  1. Have you developed a fever since your procedure? NO  2.   Have you had an respiratory symptoms (SOB or cough) since your procedure? NO  3.   Have you tested positive for COVID 19 since your procedure NO  4.   Have you had any family members/close contacts diagnosed with the COVID 19 since your procedure?  NO   If yes to any of these questions please route to Joylene John, RN and Alphonsa Gin, RN.

## 2018-08-06 ENCOUNTER — Encounter: Payer: Self-pay | Admitting: Gastroenterology

## 2018-11-28 ENCOUNTER — Emergency Department (HOSPITAL_COMMUNITY)
Admission: EM | Admit: 2018-11-28 | Discharge: 2018-11-28 | Disposition: A | Discharge status: Home / Self Care | Payer: Medicaid Other | Attending: Emergency Medicine | Admitting: Emergency Medicine

## 2018-11-28 ENCOUNTER — Emergency Department (HOSPITAL_COMMUNITY): Payer: Medicaid Other

## 2018-11-28 ENCOUNTER — Other Ambulatory Visit: Payer: Self-pay

## 2018-11-28 ENCOUNTER — Ambulatory Visit (HOSPITAL_COMMUNITY): Admission: RE | Admit: 2018-11-28 | Payer: Medicaid Other | Source: Ambulatory Visit

## 2018-11-28 DIAGNOSIS — Z79899 Other long term (current) drug therapy: Secondary | ICD-10-CM | POA: Diagnosis not present

## 2018-11-28 DIAGNOSIS — S2222XA Fracture of body of sternum, initial encounter for closed fracture: Secondary | ICD-10-CM | POA: Insufficient documentation

## 2018-11-28 DIAGNOSIS — T7421XA Adult sexual abuse, confirmed, initial encounter: Secondary | ICD-10-CM

## 2018-11-28 DIAGNOSIS — Y939 Activity, unspecified: Secondary | ICD-10-CM | POA: Insufficient documentation

## 2018-11-28 DIAGNOSIS — Y92009 Unspecified place in unspecified non-institutional (private) residence as the place of occurrence of the external cause: Secondary | ICD-10-CM | POA: Insufficient documentation

## 2018-11-28 DIAGNOSIS — Y999 Unspecified external cause status: Secondary | ICD-10-CM | POA: Insufficient documentation

## 2018-11-28 DIAGNOSIS — S299XXA Unspecified injury of thorax, initial encounter: Secondary | ICD-10-CM | POA: Diagnosis present

## 2018-11-28 DIAGNOSIS — F17210 Nicotine dependence, cigarettes, uncomplicated: Secondary | ICD-10-CM | POA: Insufficient documentation
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
## 2018-12-02 ENCOUNTER — Inpatient Hospital Stay: Payer: Medicaid Other

## 2018-12-02 ENCOUNTER — Telehealth: Payer: Self-pay | Admitting: *Deleted

## 2018-12-02 ENCOUNTER — Inpatient Hospital Stay: Payer: Medicaid Other | Attending: Oncology | Admitting: Oncology

## 2018-12-02 ENCOUNTER — Telehealth: Payer: Self-pay | Admitting: Oncology

## 2018-12-02 DIAGNOSIS — Z85038 Personal history of other malignant neoplasm of large intestine: Secondary | ICD-10-CM | POA: Insufficient documentation

## 2018-12-02 DIAGNOSIS — Z9221 Personal history of antineoplastic chemotherapy: Secondary | ICD-10-CM | POA: Insufficient documentation

## 2018-12-02 DIAGNOSIS — Z23 Encounter for immunization: Secondary | ICD-10-CM | POA: Insufficient documentation

## 2018-12-02 NOTE — Telephone Encounter (Signed)
Returned patient's phone call regarding rescheduling cancelled 10/05 appointment, per patient's request appointment has moved to 10/08.

## 2018-12-02 NOTE — Telephone Encounter (Addendum)
Left VM for patient that MD wanted her to have CT chest before she was seen again. Asked her to call to let RN know if she agrees to reschedule the CT scan again. Patient called back to agree to CT scan. Needs all appointments after 1 pm due to childcare.

## 2018-12-03 ENCOUNTER — Telehealth: Payer: Self-pay | Admitting: *Deleted

## 2018-12-03 NOTE — Telephone Encounter (Signed)
Called patient w/CT scan appointment on 10/7 and move her 10/8 to 12/09/18 at 1:45 at MD request. Patient agrees to change.

## 2018-12-04 ENCOUNTER — Other Ambulatory Visit: Payer: Self-pay

## 2018-12-04 ENCOUNTER — Encounter (HOSPITAL_COMMUNITY): Payer: Self-pay

## 2018-12-04 ENCOUNTER — Ambulatory Visit (HOSPITAL_COMMUNITY)
Admission: RE | Admit: 2018-12-04 | Discharge: 2018-12-04 | Disposition: A | Discharge status: Home / Self Care | Payer: Medicaid Other | Source: Ambulatory Visit | Attending: Oncology | Admitting: Oncology

## 2018-12-04 DIAGNOSIS — C187 Malignant neoplasm of sigmoid colon: Secondary | ICD-10-CM | POA: Insufficient documentation

## 2018-12-05 ENCOUNTER — Other Ambulatory Visit: Payer: Medicaid Other

## 2018-12-05 ENCOUNTER — Ambulatory Visit: Payer: Medicaid Other | Admitting: Nurse Practitioner

## 2018-12-09 ENCOUNTER — Encounter: Payer: Self-pay | Admitting: Nurse Practitioner

## 2018-12-09 ENCOUNTER — Inpatient Hospital Stay (HOSPITAL_BASED_OUTPATIENT_CLINIC_OR_DEPARTMENT_OTHER): Payer: Medicaid Other | Admitting: Nurse Practitioner

## 2018-12-09 ENCOUNTER — Other Ambulatory Visit: Payer: Self-pay

## 2018-12-09 ENCOUNTER — Telehealth: Payer: Self-pay

## 2018-12-09 ENCOUNTER — Inpatient Hospital Stay: Payer: Medicaid Other

## 2018-12-09 VITALS — BP 121/60 | HR 71 | Temp 98.2°F | Resp 18 | Ht 63.0 in | Wt 192.4 lb

## 2018-12-09 DIAGNOSIS — Z9221 Personal history of antineoplastic chemotherapy: Secondary | ICD-10-CM | POA: Diagnosis not present

## 2018-12-09 DIAGNOSIS — C187 Malignant neoplasm of sigmoid colon: Secondary | ICD-10-CM

## 2018-12-09 DIAGNOSIS — Z85038 Personal history of other malignant neoplasm of large intestine: Secondary | ICD-10-CM | POA: Diagnosis not present

## 2018-12-09 DIAGNOSIS — Z23 Encounter for immunization: Secondary | ICD-10-CM | POA: Diagnosis not present

## 2018-12-09 LAB — CEA (IN HOUSE-CHCC): CEA (CHCC-In House): 1.9 ng/mL (ref 0.00–5.00)

## 2018-12-09 MED ORDER — INFLUENZA VAC SPLIT QUAD 0.5 ML IM SUSY
PREFILLED_SYRINGE | INTRAMUSCULAR | Status: AC
  Filled 2018-12-09: qty 0.5

## 2018-12-09 MED ORDER — INFLUENZA VAC SPLIT QUAD 0.5 ML IM SUSY
0.5000 mL | PREFILLED_SYRINGE | Freq: Once | INTRAMUSCULAR | Status: AC
  Administered 2018-12-09: 0.5 mL via INTRAMUSCULAR

## 2018-12-09 NOTE — Progress Notes (Signed)
  [place] OFFICE PROGRESS NOTE   Diagnosis: Colon cancer  INTERVAL HISTORY:   Katie Reed returns for follow-up.  She feels well.  No change in bowel habits.  No blood or pain with bowel movements.  She has a good appetite.    Objective:  Vital signs in last 24 hours:  Blood pressure 121/60, pulse 71, temperature 98.2 F (36.8 C), temperature source Temporal, resp. rate 18, height _0  (1.6 m), weight 192 lb 6.4 oz (87.3 kg), last menstrual period 11/25/2018, SpO2 90 %.    HEENT: Neck without mass.  No thrush or ulcers. Lymphatics: No palpable cervical, supraclavicular, axillary or inguinal lymph nodes. GI: Abdomen soft and nontender.  No hepatomegaly.  No mass. Vascular: No leg edema. Neuro: Alert and oriented.   Lab Results:  Lab Results  Component Value Date   WBC 4.3 07/17/2018   HGB 12.2 07/17/2018   HCT 37.3 07/17/2018   MCV 91.6 07/17/2018   PLT 364 07/17/2018   NEUTROABS 1.9 07/17/2018    Imaging:  No results found.  Medications: I have reviewed the patient's current medications.  Assessment/Plan: 1. 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 0/25 lymph nodes positive ? Tumor penetrated through the muscularis to within a cell of the serosal surface per personal discussion with the pathologist, therefore clinical impression is of anearly T4 lesion ? Cycle 1 adjuvant Xeloda 08/13/2017 ? Cycle 2 adjuvant Xeloda 09/03/2017 ? Cycle 3 adjuvantXeloda 09/24/2017 ? Cycle 4adjuvant Xeloda 10/15/2017 ? Cycle 5 adjuvant Xeloda 11/04/2017 ? Cycle 6 adjuvant Xeloda 11/26/2017 ? Cycle 7 adjuvant Xeloda 12/17/2017 ? Cycle 8 adjuvant Xeloda 01/07/2018 ? CTs 07/25/2018- no metastatic disease; solitary tiny to minimally solid right upper lobe pulmonary nodule ? CT chest 12/04/2018- stable 2 mm subpleural nodule  anterior right upper lobe. 2. Transverse colon polyp noted on colonoscopy 03/13/2017, tubular adenoma  Disposition: Katie Reed remains in clinical remission from colon cancer.  We will follow-up on the CEA from today.  The recent chest CT showed a stable 2 mm nodule right upper lobe which is likely benign.  She will return for a CEA and follow-up visit in 6 months.  Plan reviewed with Dr. Benay Spice.  [name] ANP/GNP-BC   12/09/2018  2:23 PM

## 2018-12-09 NOTE — Telephone Encounter (Signed)
TC to pt per Dr Benay Spice to let her know that  CT with stable lung nodule, no evidence of cancer. Patient verbalized understanding. No further problems or concerns at this time.

## 2018-12-11 ENCOUNTER — Telehealth: Payer: Self-pay

## 2018-12-11 NOTE — Telephone Encounter (Signed)
TC to pt per [name] NP to let her know  CEA is in normal range. Spoke with husband who stated that he would let her know . No further problems or concerns at this time.

## 2018-12-16 ENCOUNTER — Telehealth: Payer: Self-pay

## 2018-12-16 NOTE — Telephone Encounter (Signed)
VM message from Pt inquiring about lab results Per previous Note Per Lattie Haw T NP  Pt's CEA results are in normal range. VM message left to return call to [facility].

## 2019-05-20 ENCOUNTER — Emergency Department (HOSPITAL_COMMUNITY)
Admission: EM | Admit: 2019-05-20 | Discharge: 2019-05-20 | Disposition: A | Discharge status: Home / Self Care | Payer: Medicaid Other | Attending: Emergency Medicine | Admitting: Emergency Medicine

## 2019-05-20 ENCOUNTER — Other Ambulatory Visit: Payer: Self-pay

## 2019-05-20 ENCOUNTER — Encounter (HOSPITAL_COMMUNITY): Payer: Self-pay | Admitting: Emergency Medicine

## 2019-05-20 ENCOUNTER — Emergency Department (HOSPITAL_COMMUNITY): Payer: Medicaid Other

## 2019-05-20 DIAGNOSIS — R102 Pelvic and perineal pain: Secondary | ICD-10-CM | POA: Insufficient documentation

## 2019-05-20 DIAGNOSIS — N83201 Unspecified ovarian cyst, right side: Secondary | ICD-10-CM | POA: Diagnosis not present

## 2019-05-20 DIAGNOSIS — R109 Unspecified abdominal pain: Secondary | ICD-10-CM | POA: Diagnosis present

## 2019-05-20 DIAGNOSIS — F17210 Nicotine dependence, cigarettes, uncomplicated: Secondary | ICD-10-CM | POA: Diagnosis not present

## 2019-05-20 DIAGNOSIS — R1033 Periumbilical pain: Secondary | ICD-10-CM | POA: Insufficient documentation

## 2019-05-20 DIAGNOSIS — M5489 Other dorsalgia: Secondary | ICD-10-CM | POA: Diagnosis not present

## 2019-05-20 LAB — COMPREHENSIVE METABOLIC PANEL
ALT: 28 U/L (ref 0–44)
AST: 16 U/L (ref 15–41)
Albumin: 4.3 g/dL (ref 3.5–5.0)
Alkaline Phosphatase: 42 U/L (ref 38–126)
Anion gap: 9 (ref 5–15)
BUN: 15 mg/dL (ref 6–20)
CO2: 23 mmol/L (ref 22–32)
Calcium: 9.3 mg/dL (ref 8.9–10.3)
Chloride: 106 mmol/L (ref 98–111)
Creatinine, Ser: 0.75 mg/dL (ref 0.44–1.00)
GFR calc Af Amer: >60 mL/min (ref >60)
GFR calc non Af Amer: >60 mL/min (ref >60)
Glucose, Bld: 99 mg/dL (ref 70–99)
Potassium: 3.8 mmol/L (ref 3.5–5.1)
Sodium: 138 mmol/L (ref 135–145)
Total Bilirubin: 0.6 mg/dL (ref 0.3–1.2)
Total Protein: 7.5 g/dL (ref 6.5–8.1)

## 2019-05-20 LAB — URINALYSIS, ROUTINE W REFLEX MICROSCOPIC
Bacteria, UA: NONE SEEN
Bilirubin Urine: NEGATIVE
Glucose, UA: NEGATIVE mg/dL
Ketones, ur: NEGATIVE mg/dL
Nitrite: NEGATIVE
Protein, ur: NEGATIVE mg/dL
Specific Gravity, Urine: 1.024 (ref 1.005–1.030)
pH: 5 (ref 5.0–8.0)

## 2019-05-20 LAB — LIPASE, BLOOD: Lipase: 28 U/L (ref 11–51)

## 2019-05-20 LAB — CBC
HCT: 37.7 % (ref 36.0–46.0)
Hemoglobin: 12.1 g/dL (ref 12.0–15.0)
MCH: 29.8 pg (ref 26.0–34.0)
MCHC: 32.1 g/dL (ref 30.0–36.0)
MCV: 92.9 fL (ref 80.0–100.0)
Platelets: 334 10*3/uL (ref 150–400)
RBC: 4.06 MIL/uL (ref 3.87–5.11)
RDW: 13.6 % (ref 11.5–15.5)
WBC: 5.2 10*3/uL (ref 4.0–10.5)
nRBC: 0 % (ref 0.0–0.2)

## 2019-05-20 LAB — WET PREP, GENITAL
Clue Cells Wet Prep HPF POC: NONE SEEN
Sperm: NONE SEEN
Trich, Wet Prep: NONE SEEN
Yeast Wet Prep HPF POC: NONE SEEN

## 2019-05-20 LAB — I-STAT BETA HCG BLOOD, ED (MC, WL, AP ONLY): I-stat hCG, quantitative: <5 m[IU]/mL (ref <5)

## 2019-05-20 MED ORDER — FENTANYL CITRATE (PF) 100 MCG/2ML IJ SOLN
50.0000 ug | Freq: Once | INTRAMUSCULAR | Status: AC
  Administered 2019-05-20: 18:00:00 50 ug via INTRAVENOUS
  Filled 2019-05-20: qty 2

## 2019-05-20 MED ORDER — ONDANSETRON HCL 4 MG/2ML IJ SOLN
4.0000 mg | Freq: Once | INTRAMUSCULAR | Status: AC
  Administered 2019-05-20: 4 mg via INTRAVENOUS
  Filled 2019-05-20: qty 2

## 2019-05-20 MED ORDER — IOHEXOL 300 MG/ML  SOLN
100.0000 mL | Freq: Once | INTRAMUSCULAR | Status: AC | PRN
  Administered 2019-05-20: 100 mL via INTRAVENOUS

## 2019-05-20 MED ORDER — SODIUM CHLORIDE 0.9% FLUSH
3.0000 mL | Freq: Once | INTRAVENOUS | Status: AC
  Administered 2019-05-20: 17:00:00 3 mL via INTRAVENOUS

## 2019-05-20 NOTE — Discharge Instructions (Signed)
As we discussed, your blood work today was reassuring.  Your CT scan did show a right ovarian cyst which may be contributing to your pain.  You can take Tylenol or ibuprofen for this.  This may be causing some of the discomfort that you have a sex.  Please follow-up with referred OB/GYN for further evaluation of your symptoms.  As we discussed, your CT scan did show some minor abnormalities but it was not confirmative of an appendicitis diagnosis.  I have discussed it with the surgeon on-call who looked at your CT scan.  He does not feel like this is an acute appendicitis at this time that requires surgery.  He recommends following up in 2 days.  Please return to the this emergency department 2 days to have your blood work rechecked.  Return the emergency department sooner for any worsening pain, fevers, vomiting, inability eat or drink or any other worsening or concerning symptoms.

## 2019-05-20 NOTE — ED Notes (Signed)
Consult to General Surgery@19 :20pm.

## 2019-05-20 NOTE — ED Triage Notes (Signed)
Patient here from home with complaints of lower abd pain below belly button radiating around to back that started on Saturday.

## 2019-05-20 NOTE — ED Notes (Signed)
Pt provided ginger ale and crackers for fluid/Po Challenge.

## 2019-05-20 NOTE — ED Notes (Signed)
Pt transferred to CT.

## 2019-05-20 NOTE — ED Notes (Signed)
ED Provider at bedside. 

## 2019-05-20 NOTE — ED Provider Notes (Signed)
[facility] DEPT Provider Note   CSN: [id number] Arrival date & time: 05/20/19  1231     History Chief Complaint  Patient presents with  . Abdominal Pain  . Pelvic Pain  . Back Pain    Katie Reed is a 39 y.o. female past medical history of colon cancer who presents for evaluation for lower abdominal pain that has been ongoing for last 4 days.  Patient reports that she feels like it is a tightening in her mid and lower abdomen.  She states sometimes it radiates to her lower back.  She states that she has taken Aleve with no improvement in symptoms.  She denies any other alleviating factors.  She states it is slightly better if she sits still.  She states it is worse when she goes to urinate or have a bowel movement.  She feels like there is more pressure in her abdomen.  She does not have any dysuria or hematuria.  Her last normal bowel movement was about 6 days ago.  She states since then, she has had small amounts of stool to pass.  She states she has not passed any flatus for the last 2 to 3 days.  No nausea/vomiting.  She has not had any fevers.  She also endorses that she has had some pain with intercourse but states that this is been an issue that has been ongoing before her pain.  She does not note any vaginal discharge.  She states she had some vaginal bleeding consistent with a period.  Last week but states it only lasted a few days.  She has history of abdominal surgery secondary to sigmoidectomy and intussusception.  She is currently sexually active with one partner.   The history is provided by the patient.       Past Medical History:  Diagnosis Date  . Anxiety   . Colon cancer ([place]) dx'd 12/2016   oral chemo comp 2019  . Depression   . Preterm labor     Patient Active Problem List   Diagnosis Date Noted  . Colon cancer ([place]) 08/07/2017  . Colonic mass 07/25/2017  . Intussusception intestine ([place]) 02/19/2017  . Depression   .  Anxiety   . Active labor 04/01/2015  . ASCUS with positive high risk HPV 03/23/2015  . Supervision of high-risk pregnancy 12/31/2014  . High risk pregnancy due to history of preterm labor 12/31/2014  . AMA (advanced maternal age) multigravida 35+ 12/31/2014  . Obesity in pregnancy 12/31/2014  . Tobacco smoking affecting pregnancy, antepartum 12/31/2014  . Abnormal glucose tolerance test (GTT) during pregnancy, antepartum 12/31/2014    Past Surgical History:  Procedure Laterality Date  . FLEXIBLE SIGMOIDOSCOPY N/A 02/19/2017   Procedure: FLEXIBLE SIGMOIDOSCOPY;  Surgeon: Otis Brace, MD;  Location: [facility];  Service: Gastroenterology;  Laterality: N/A;  . NO PAST SURGERIES    . THERAPEUTIC ABORTION     elective     OB History    Gravida  6   Para  5   Term  4   Preterm  1   AB  1   Living  5     SAB  0   TAB  1   Ectopic  0   Multiple  0   Live Births  5           Family History  Problem Relation Age of Onset  . Hypertension Mother   . Colon cancer Mother   . Colon cancer  Sister   . Alcohol abuse Neg Hx   . Arthritis Neg Hx   . Asthma Neg Hx   . Birth defects Neg Hx   . Cancer Neg Hx   . COPD Neg Hx   . Depression Neg Hx   . Diabetes Neg Hx   . Drug abuse Neg Hx   . Early death Neg Hx   . Hearing loss Neg Hx   . Heart disease Neg Hx   . Hyperlipidemia Neg Hx   . Kidney disease Neg Hx   . Learning disabilities Neg Hx   . Mental illness Neg Hx   . Mental retardation Neg Hx   . Miscarriages / Stillbirths Neg Hx   . Stroke Neg Hx   . Vision loss Neg Hx   . Varicose Veins Neg Hx     Social History   Tobacco Use  . Smoking status: Current Some Day Smoker    Packs/day: 0.25    Years: 14.00    Pack years: 3.50    Types: Cigarettes  . Smokeless tobacco: Never Used  . Tobacco comment: 1-2 cigarettes a day  Substance Use Topics  . Alcohol use: Yes    Alcohol/week: 1.0 standard drinks    Types: 1 Glasses of wine per week     Comment: 1-2 times per week  . Drug use: No    Home Medications Prior to Admission medications   Not on File    Allergies    Patient has no known allergies.  Review of Systems   Review of Systems  Constitutional: Negative for fever.  Respiratory: Negative for cough and shortness of breath.   Cardiovascular: Negative for chest pain.  Gastrointestinal: Positive for abdominal pain and constipation. Negative for nausea and vomiting.  Genitourinary: Negative for dysuria and hematuria.       Dyspareunia  Neurological: Negative for headaches.  All other systems reviewed and are negative.   Physical Exam Updated Vital Signs BP 120/81   Pulse 62   Temp 98.9 F (37.2 C) (Oral)   Resp 16   LMP 05/15/2019   SpO2 100%   Physical Exam Vitals and nursing note reviewed. Exam conducted with a chaperone present.  Constitutional:      Appearance: Normal appearance. She is well-developed.  HENT:     Head: Normocephalic and atraumatic.  Eyes:     General: Lids are normal. No scleral icterus.       Right eye: No discharge.        Left eye: No discharge.     Conjunctiva/sclera: Conjunctivae normal.     Pupils: Pupils are equal, round, and reactive to light.  Cardiovascular:     Rate and Rhythm: Normal rate and regular rhythm.     Pulses: Normal pulses.     Heart sounds: Normal heart sounds. No murmur. No friction rub. No gallop.   Pulmonary:     Effort: Pulmonary effort is normal.     Breath sounds: Normal breath sounds.     Comments: Lungs clear to auscultation bilaterally.  Symmetric chest rise.  No wheezing, rales, rhonchi. Abdominal:     Palpations: Abdomen is soft. Abdomen is not rigid.     Tenderness: There is abdominal tenderness in the periumbilical area. There is no right CVA tenderness, left CVA tenderness or guarding.     Comments: Abdomen is soft, nondistended.  Tenderness palpation in periumbilical area.  No rigidity, guarding.  No CVA tenderness.  Genitourinary:     Vagina: Normal.  No bleeding.     Cervix: No cervical motion tenderness, discharge or friability.     Adnexa:        Right: No mass or tenderness.         Left: No mass or tenderness.       Comments: The exam was performed with a chaperone present. Normal external female genitalia. No lesions, rash, or sores.  No CMT.  No adnexal mass or tenderness noted bilaterally. Musculoskeletal:        General: Normal range of motion.     Cervical back: Full passive range of motion without pain.  Skin:    General: Skin is warm and dry.     Capillary Refill: Capillary refill takes less than 2 seconds.  Neurological:     Mental Status: She is alert and oriented to person, place, and time.  Psychiatric:        Speech: Speech normal.        Behavior: Behavior normal.     ED Results / Procedures / Treatments   Labs (all labs ordered are listed, but only abnormal results are displayed) Labs Reviewed  WET PREP, GENITAL - Abnormal; Notable for the following components:      Result Value   WBC, Wet Prep HPF POC RARE (*)    All other components within normal limits  URINALYSIS, ROUTINE W REFLEX MICROSCOPIC - Abnormal; Notable for the following components:   Hgb urine dipstick SMALL (*)    Leukocytes,Ua TRACE (*)    All other components within normal limits  LIPASE, BLOOD  COMPREHENSIVE METABOLIC PANEL  CBC  I-STAT BETA HCG BLOOD, ED ([facility], [facility], [facility] ONLY)  GC/CHLAMYDIA PROBE AMP ([place]) NOT AT [facility]  WET PREP  (BD AFFIRM) ([place])    EKG None  Radiology CT ABDOMEN PELVIS W CONTRAST  Result Date: 05/20/2019 CLINICAL DATA:  Lower abdominal pain radiating to the back. EXAM: CT ABDOMEN AND PELVIS WITH CONTRAST TECHNIQUE: Multidetector CT imaging of the abdomen and pelvis was performed using the standard protocol following bolus administration of intravenous contrast. CONTRAST:  13mL OMNIPAQUE IOHEXOL 300 MG/ML  SOLN COMPARISON:  07/25/2018 FINDINGS: Lower chest: Unremarkable Hepatobiliary:  Unremarkable Pancreas: Pancreas divisum. Spleen: Unremarkable Adrenals/Urinary Tract: Unremarkable Stomach/Bowel: There is a mildly dilated or thickened segment of the mid appendix as it passes just in front of the uterus, up to 0.8 cm in diameter on image 70/2. Small amount of gas inside this segment of the appendix. I do not see well-defined Peri appendiceal inflammatory stranding but there is a small amount of free pelvic fluid anterior to the lower uterine segment, and the proximal appendix courses along the margin of this fluid. Sigmoid anastomotic staple line. Borderline prominence of stool in the colon. Vascular/Lymphatic: Unremarkable Reproductive: Mildly complex 2.6 by 1.8 cm hypodense right ovarian lesion, with faint enhancement along its margins and probable minimal internal septation. The left ovary appears normal. Other: Small amount of free fluid anterior to the lower uterine segment and above the urinary bladder as noted above. Musculoskeletal: Unremarkable IMPRESSION: 1. No findings of recurrent malignancy. 2. Mildly dilated or conceivably mildly thickened mid appendix just anterior to the uterus. There are speckles of gas density in the prominent segment of the appendix and I am uncertain that the mild prominence of diameter to 0.8 cm necessarily indicates wall thickening as this segment may simply be mildly dilated. 3. There is also a 2.6 by 1.8 cm fluid signal intensity lesion of the right ovary with mild  complexity which could be a complex cyst or corpus luteum cyst. 4. Small nonspecific amount of free pelvic fluid anterior to the lower uterine segment. 5. From an imaging standpoint its difficult to ascertain whether the patient's symptoms may be arising from the right ovarian lesion or the mildly prominent appendix. Both are mildly abnormal but not strikingly so. Similarly the origin of the small amount of free fluid is uncertain. Electronically Signed   By: Van Clines M.D.   On:  05/20/2019 18:52    Procedures Procedures (including critical care time)  Medications Ordered in ED Medications  sodium chloride flush (NS) 0.9 % injection 3 mL (3 mLs Intravenous Given 05/20/19 1656)  fentaNYL (SUBLIMAZE) injection 50 mcg (50 mcg Intravenous Given 05/20/19 1813)  ondansetron (ZOFRAN) injection 4 mg (4 mg Intravenous Given 05/20/19 1813)  iohexol (OMNIPAQUE) 300 MG/ML solution 100 mL (100 mLs Intravenous Contrast Given 05/20/19 1754)    ED Course  I have reviewed the triage vital signs and the nursing notes.  Pertinent labs & imaging results that were available during my care of the patient were reviewed by me and considered in my medical decision making (see chart for details).    MDM Rules/Calculators/A&P                      39 year old female who presents for evaluation of abdominal pain.  No fevers, nausea/vomiting.  No vaginal complaints.  Initially arrival, she is afebrile, nontoxic-appearing.  Vital signs are stable.  On exam, she has some periumbilical abdominal tenderness.  No rigidity, guarding.  No CVA tenderness.  Consider infectious etiology.  Low suspicion for GU etiology.  Do not suspect PID given given history/physical exam.  Additionally, her exam is not concerning for ovarian torsion.  Given her history, also concerned about possible small bowel obstruction versus recurrence of cancer versus intussusception.  We will plan to check labs.  Given history, will plan for CT on pelvis.  Pelvic exam as documented above.  No CMT.  No adnexal mass or tenderness noted bilaterally.  No indication for ultrasound.  Exam not concerning for PID.  I-stat beta negative. CMP is unremarkable. Lipase is normal. CBC shows no leukocytosis or anemia.  UA shows trace leukocytes.  Otherwise unremarkable.  Wet prep shows white blood cells.  CT shows mildly dilated and mildly thickened mid appendix just anterior to the uterus.  She also has mention of 2 x 6 x 1.8 cm fluid density  lesion of the right ovary which could be a complex cyst.  I discussed with Dr. Harlow Asa (Gen Surg) who will look at the CT scan.  Discussed patient with Dr. Harlow Asa (Gen Surg) after reviewing CT scan.  He reviewed today's CT scan as well as her prior CT scans.  He does not feel that today's CT scan represents acute appendicitis.  Additionally, given that she is afebrile, does not have a white blood cell count, he feels that patient had been recently discharged with plans to follow-up in 2 days for repeat blood work to see if she has any significant leukocytosis.  Given her overall well appearance on exam, I feel that this is reasonable.  Patient able to tolerate p.o. without any difficulty.  She is sitting comfortably on bed with no signs of distress.  She is hemodynamically stable.  I discussed with her regarding the plan and she is agreeable.  We will give her outpatient OB/GYN follow-up to follow-up regarding the ovarian cyst.  Patient instructed  to return to the emergency department in 2 days for repeat blood work or sooner if she starts having worsening symptoms. At this time, patient exhibits no emergent life-threatening condition that require further evaluation in ED or admission. Patient had ample opportunity for questions and discussion. All patient's questions were answered with full understanding. Strict return precautions discussed. Patient expresses understanding and agreement to plan.   Portions of this note were generated with [employer]. Dictation errors may occur despite best attempts at proofreading.   Final Clinical Impression(s) / ED Diagnoses Final diagnoses:  Periumbilical abdominal pain  Cyst of right ovary    Rx / DC Orders ED Discharge Orders    None       Desma Mcgregor 05/20/19 2348    Carmin Muskrat, MD 05/21/19 [phone]

## 2019-05-21 ENCOUNTER — Encounter: Payer: Medicaid Other | Admitting: Obstetrics and Gynecology

## 2019-05-21 LAB — GC/CHLAMYDIA PROBE AMP (~~LOC~~) NOT AT ARMC
Chlamydia: NEGATIVE
Neisseria Gonorrhea: NEGATIVE

## 2019-06-04 ENCOUNTER — Other Ambulatory Visit (HOSPITAL_COMMUNITY)
Admission: RE | Admit: 2019-06-04 | Discharge: 2019-06-04 | Disposition: A | Discharge status: Home / Self Care | Payer: Medicaid Other | Source: Ambulatory Visit | Attending: Obstetrics and Gynecology | Admitting: Obstetrics and Gynecology

## 2019-06-04 ENCOUNTER — Encounter: Payer: Self-pay | Admitting: Obstetrics & Gynecology

## 2019-06-04 ENCOUNTER — Ambulatory Visit (INDEPENDENT_AMBULATORY_CARE_PROVIDER_SITE_OTHER): Payer: Medicaid Other | Admitting: Obstetrics & Gynecology

## 2019-06-04 ENCOUNTER — Other Ambulatory Visit: Payer: Self-pay

## 2019-06-04 VITALS — BP 112/70 | HR 66 | Ht 63.0 in | Wt 195.9 lb

## 2019-06-04 DIAGNOSIS — N83291 Other ovarian cyst, right side: Secondary | ICD-10-CM | POA: Diagnosis not present

## 2019-06-04 DIAGNOSIS — Z01419 Encounter for gynecological examination (general) (routine) without abnormal findings: Secondary | ICD-10-CM | POA: Diagnosis present

## 2019-06-04 DIAGNOSIS — N83201 Unspecified ovarian cyst, right side: Secondary | ICD-10-CM

## 2019-06-04 NOTE — Progress Notes (Signed)
Patient ID: Katie Reed, female   DOB: Nov 16, 1980, 39 y.o.   MRN: [id number]  Chief Complaint  Patient presents with  . Gynecologic Exam  ED f/u ovarian cyst  HPI Katie Reed is a 39 y.o. female. [id number] She had low abdominal pain 05/20/19 and was seen [facility]. CT showed a 3-2 cm right ovarian cyst, septated, possible CL. Still some cramping pain but no fever or nausea or anorexia. She was not thought to have appendicitis.  HPI  Past Medical History:  Diagnosis Date  . Anxiety   . Colon cancer ([place]) dx'd 12/2016   oral chemo comp 2019  . Depression   . Preterm labor     Past Surgical History:  Procedure Laterality Date  . FLEXIBLE SIGMOIDOSCOPY N/A 02/19/2017   Procedure: FLEXIBLE SIGMOIDOSCOPY;  Surgeon: Otis Brace, MD;  Location: [facility];  Service: Gastroenterology;  Laterality: N/A;  . NO PAST SURGERIES    . THERAPEUTIC ABORTION     elective    Family History  Problem Relation Age of Onset  . Hypertension Mother   . Colon cancer Mother   . Colon cancer Sister   . Alcohol abuse Neg Hx   . Arthritis Neg Hx   . Asthma Neg Hx   . Birth defects Neg Hx   . Cancer Neg Hx   . COPD Neg Hx   . Depression Neg Hx   . Diabetes Neg Hx   . Drug abuse Neg Hx   . Early death Neg Hx   . Hearing loss Neg Hx   . Heart disease Neg Hx   . Hyperlipidemia Neg Hx   . Kidney disease Neg Hx   . Learning disabilities Neg Hx   . Mental illness Neg Hx   . Mental retardation Neg Hx   . Miscarriages / Stillbirths Neg Hx   . Stroke Neg Hx   . Vision loss Neg Hx   . Varicose Veins Neg Hx     Social History Social History   Tobacco Use  . Smoking status: Current Some Day Smoker    Packs/day: 0.25    Years: 14.00    Pack years: 3.50    Types: Cigarettes  . Smokeless tobacco: Never Used  . Tobacco comment: 1-2 cigarettes a day  Substance Use Topics  . Alcohol use: Yes    Alcohol/week: 1.0 standard drinks    Types: 1 Glasses of wine per week    Comment: 1-2  times per week  . Drug use: No    No Known Allergies  No current outpatient medications on file.   No current facility-administered medications for this visit.    Review of Systems Review of Systems  Constitutional: Negative.   Respiratory: Negative.   Gastrointestinal: Positive for abdominal pain. Negative for abdominal distention and nausea.  Genitourinary: Positive for pelvic pain.    Blood pressure 112/70, pulse 66, height 5\' 3"  (1.6 m), weight 195 lb 14.4 oz (88.9 kg), last menstrual period 05/30/2019.  Physical Exam Physical Exam Vitals and nursing note reviewed. Exam conducted with a chaperone present.  Constitutional:      Appearance: Normal appearance. She is obese. She is not ill-appearing.  Pulmonary:     Effort: Pulmonary effort is normal.  Abdominal:     General: Abdomen is flat.     Palpations: Abdomen is soft.  Genitourinary:    General: Normal vulva.     Vagina: No vaginal discharge.     Comments: Pelvic exam: normal external genitalia,  vulva, vagina, cervix, uterus and adnexa.  Neurological:     Mental Status: She is alert.     Data Reviewed CLINICAL DATA:  Lower abdominal pain radiating to the back.  EXAM: CT ABDOMEN AND PELVIS WITH CONTRAST  TECHNIQUE: Multidetector CT imaging of the abdomen and pelvis was performed using the standard protocol following bolus administration of intravenous contrast.  CONTRAST:  143mL OMNIPAQUE IOHEXOL 300 MG/ML  SOLN  COMPARISON:  07/25/2018  FINDINGS: Lower chest: Unremarkable  Hepatobiliary: Unremarkable  Pancreas: Pancreas divisum.  Spleen: Unremarkable  Adrenals/Urinary Tract: Unremarkable  Stomach/Bowel: There is a mildly dilated or thickened segment of the mid appendix as it passes just in front of the uterus, up to 0.8 cm in diameter on image 70/2. Small amount of gas inside this segment of the appendix. I do not see well-defined Peri appendiceal inflammatory stranding but there  is a small amount of free pelvic fluid anterior to the lower uterine segment, and the proximal appendix courses along the margin of this fluid.  Sigmoid anastomotic staple line. Borderline prominence of stool in the colon.  Vascular/Lymphatic: Unremarkable  Reproductive: Mildly complex 2.6 by 1.8 cm hypodense right ovarian lesion, with faint enhancement along its margins and probable minimal internal septation. The left ovary appears normal.  Other: Small amount of free fluid anterior to the lower uterine segment and above the urinary bladder as noted above.  Musculoskeletal: Unremarkable  IMPRESSION: 1. No findings of recurrent malignancy. 2. Mildly dilated or conceivably mildly thickened mid appendix just anterior to the uterus. There are speckles of gas density in the prominent segment of the appendix and I am uncertain that the mild prominence of diameter to 0.8 cm necessarily indicates wall thickening as this segment may simply be mildly dilated. 3. There is also a 2.6 by 1.8 cm fluid signal intensity lesion of the right ovary with mild complexity which could be a complex cyst or corpus luteum cyst. 4. Small nonspecific amount of free pelvic fluid anterior to the lower uterine segment. 5. From an imaging standpoint its difficult to ascertain whether the patient's symptoms may be arising from the right ovarian lesion or the mildly prominent appendix. Both are mildly abnormal but not strikingly so. Similarly the origin of the small amount of free fluid is uncertain.   Electronically Signed   By: Van Clines M.D.   On: 05/20/2019 18:52  CBC    Component Value Date/Time   WBC 5.2 05/20/2019 1249   RBC 4.06 05/20/2019 1249   HGB 12.1 05/20/2019 1249   HGB 12.2 07/17/2018 0954   HGB 9.7 12/18/2014 0000   HCT 37.7 05/20/2019 1249   HCT 34 12/17/2014 0000   HCT 34 12/17/2014 0000   PLT 334 05/20/2019 1249   PLT 364 07/17/2018 0954   PLT 349  12/17/2014 0000   PLT 349 12/17/2014 0000   MCV 92.9 05/20/2019 1249   MCH 29.8 05/20/2019 1249   MCHC 32.1 05/20/2019 1249   RDW 13.6 05/20/2019 1249   LYMPHSABS 1.9 07/17/2018 0954   MONOABS 0.3 07/17/2018 0954   EOSABS 0.2 07/17/2018 0954   BASOSABS 0.1 07/17/2018 0954    Assessment 2.6 by 1.8 cm fluid signal intensity lesion of the right ovary with mild complexity which could be a complex cyst or corpus luteum cyst.   Plan F/u pelvic US in 6 weeks Pap done today She will consider infertility f/u as she has not conceived with her present partner    [name] 06/04/2019, 3:44 PM

## 2019-06-04 NOTE — Patient Instructions (Signed)
Ovarian Cyst An ovarian cyst is a fluid-filled sac on an ovary. The ovaries are organs that make eggs in women. Most ovarian cysts go away on their own and are not cancerous (are benign). Some cysts need treatment. Follow these instructions at home:  Take over-the-counter and prescription medicines only as told by your doctor.  Do not drive or use heavy machinery while taking prescription pain medicine.  Get pelvic exams and Pap tests as often as told by your doctor.  Return to your normal activities as told by your doctor. Ask your doctor what activities are safe for you.  Do not use any products that contain nicotine or tobacco, such as cigarettes and e-cigarettes. If you need help quitting, ask your doctor.  Keep all follow-up visits as told by your doctor. This is important. Contact a doctor if:  Your periods are: ? Late. ? Irregular. ? Painful.   Your periods stop.  You have pelvic pain that does not go away.  You have pressure on your bladder.  You have trouble making your bladder empty when you pee (urinate).  You have pain during sex.  You have any of the following in your belly (abdomen): ? A feeling of fullness. ? Pressure. ? Discomfort. ? Pain that does not go away. ? Swelling.  You feel sick most of the time.  You have trouble pooping (have constipation).  You are not as hungry as usual (you lose your appetite).  You get very bad acne.  You start to have more hair on your body and face.  You are gaining weight or losing weight without changing your exercise and eating habits.  You think you may be pregnant. Get help right away if:  You have belly pain that is very bad or gets worse.  You cannot eat or drink without throwing up (vomiting).  You suddenly get a fever.  Your period is a lot heavier than usual. This information is not intended to replace advice given to you by your health care provider. Make sure you discuss any questions you have  with your health care provider. Document Revised: 01/26/2017 Document Reviewed: 07/18/2015 Elsevier Patient Education  2020 [employer].

## 2019-06-06 LAB — CYTOLOGY - PAP
Comment: NEGATIVE
Diagnosis: NEGATIVE
High risk HPV: NEGATIVE

## 2019-06-09 ENCOUNTER — Other Ambulatory Visit: Payer: Self-pay

## 2019-06-09 ENCOUNTER — Inpatient Hospital Stay: Payer: Medicaid Other

## 2019-06-09 ENCOUNTER — Inpatient Hospital Stay: Payer: Medicaid Other | Attending: Oncology | Admitting: Oncology

## 2019-06-09 VITALS — BP 123/75 | HR 76 | Temp 98.5°F | Resp 18 | Ht 63.0 in | Wt 197.4 lb

## 2019-06-09 DIAGNOSIS — R918 Other nonspecific abnormal finding of lung field: Secondary | ICD-10-CM | POA: Insufficient documentation

## 2019-06-09 DIAGNOSIS — C187 Malignant neoplasm of sigmoid colon: Secondary | ICD-10-CM

## 2019-06-09 DIAGNOSIS — Z9221 Personal history of antineoplastic chemotherapy: Secondary | ICD-10-CM | POA: Insufficient documentation

## 2019-06-09 DIAGNOSIS — Z85038 Personal history of other malignant neoplasm of large intestine: Secondary | ICD-10-CM | POA: Diagnosis present

## 2019-06-09 DIAGNOSIS — N83201 Unspecified ovarian cyst, right side: Secondary | ICD-10-CM | POA: Diagnosis not present

## 2019-06-09 LAB — CEA (IN HOUSE-CHCC): CEA (CHCC-In House): 1.81 ng/mL (ref 0.00–5.00)

## 2019-06-09 NOTE — Progress Notes (Signed)
  [place] OFFICE PROGRESS NOTE   Diagnosis: Colon cancer  INTERVAL HISTORY:   Ms. Katie Reed returns for a scheduled visit.  She feels well.  Good appetite and energy level.  No difficulty with bowel function.  She was seen in the emergency room 05/20/2019 with lower abdominal pain.  A CT revealed a mildly dilated/thickened appendix.  A right ovarian cyst was noted.  She was referred to gynecology and reports she is scheduled for a pelvic ultrasound.  The pain has resolved.  Objective:  Vital signs in last 24 hours:  Blood pressure 123/75, pulse 76, temperature 98.5 F (36.9 C), temperature source Temporal, resp. rate 18, height _0  (1.6 m), weight 197 lb 6.4 oz (89.5 kg), last menstrual period 05/30/2019, SpO2 100 %.    Lymphatics: No cervical, supraclavicular, axillary, or inguinal nodes Cardio: Regular rate and rhythm GI: No hepatosplenomegaly, no mass, nontender Vascular: No leg edema     Lab Results:  Lab Results  Component Value Date   WBC 5.2 05/20/2019   HGB 12.1 05/20/2019   HCT 37.7 05/20/2019   MCV 92.9 05/20/2019   PLT 334 05/20/2019   NEUTROABS 1.9 07/17/2018    CMP  Lab Results  Component Value Date   NA 138 05/20/2019   K 3.8 05/20/2019   CL 106 05/20/2019   CO2 23 05/20/2019   GLUCOSE 99 05/20/2019   BUN 15 05/20/2019   CREATININE 0.75 05/20/2019   CALCIUM 9.3 05/20/2019   PROT 7.5 05/20/2019   ALBUMIN 4.3 05/20/2019   AST 16 05/20/2019   ALT 28 05/20/2019   ALKPHOS 42 05/20/2019   BILITOT 0.6 05/20/2019   GFRNONAA >60 05/20/2019   GFRAA >60 05/20/2019    Lab Results  Component Value Date   CEA1 1.90 12/09/2018     Medications: I have reviewed the patient's current medications.   Assessment/Plan: 1. 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 0/25 lymph nodes  positive ? Tumor penetrated through the muscularis to within a cell of the serosal surface per personal discussion with the pathologist, therefore clinical impression is of anearly T4 lesion ? Cycle 1 adjuvant Xeloda 08/13/2017 ? Cycle 2 adjuvant Xeloda 09/03/2017 ? Cycle 3 adjuvantXeloda 09/24/2017 ? Cycle 4adjuvant Xeloda 10/15/2017 ? Cycle 5 adjuvant Xeloda 11/04/2017 ? Cycle 6 adjuvant Xeloda 11/26/2017 ? Cycle 7 adjuvant Xeloda 12/17/2017 ? Cycle 8 adjuvant Xeloda 01/07/2018 ? CTs 07/25/2018- no metastatic disease; solitary tiny to minimally solid right upper lobe pulmonary nodule  ? CT chest 12/04/2018- stable 2 mm subpleural nodule anterior right upper lobe. ? CT abdomen/pelvis 05/20/2019-mildly dilated versus thickened appendix, small amount of nonspecific pelvic fluid, right ovarian cyst 2. Transverse colon polyp noted on colonoscopy 03/13/2017, tubular adenoma   Colonoscopy 07/30/2018-polyp removed from the sigmoid colon-hyperplastic polyp   Disposition: Katie Reed is in clinical remission from colon cancer.  We will follow up on the CEA from today.  She will return for an office visit, CEA, and restaging chest CT in 6 months.  We will plan for a CT abdomen/pelvis in March 2022.  I encouraged her to obtain the COVID-19 vaccine.  Betsy Coder, MD  06/09/2019  11:09 AM

## 2019-06-10 ENCOUNTER — Telehealth: Payer: Self-pay | Admitting: *Deleted

## 2019-06-10 NOTE — Telephone Encounter (Signed)
Notified of normal CEA. 

## 2019-06-10 NOTE — Telephone Encounter (Signed)
-----   Message from Ladell Pier, MD sent at 06/09/2019  1:50 PM EDT ----- Please call patient, CEA is normal

## 2019-07-16 ENCOUNTER — Ambulatory Visit: Admission: RE | Admit: 2019-07-16 | Payer: Medicaid Other | Source: Ambulatory Visit

## 2019-07-18 ENCOUNTER — Other Ambulatory Visit: Payer: Self-pay

## 2019-07-18 ENCOUNTER — Ambulatory Visit
Admission: RE | Admit: 2019-07-18 | Discharge: 2019-07-18 | Disposition: A | Discharge status: Home / Self Care | Payer: Medicaid Other | Source: Ambulatory Visit | Attending: Obstetrics & Gynecology | Admitting: Obstetrics & Gynecology

## 2019-07-18 DIAGNOSIS — N83201 Unspecified ovarian cyst, right side: Secondary | ICD-10-CM | POA: Insufficient documentation

## 2019-07-23 ENCOUNTER — Other Ambulatory Visit: Payer: Self-pay

## 2019-07-23 ENCOUNTER — Emergency Department (HOSPITAL_COMMUNITY): Payer: Medicaid Other

## 2019-07-23 ENCOUNTER — Emergency Department (HOSPITAL_COMMUNITY)
Admission: EM | Admit: 2019-07-23 | Discharge: 2019-07-23 | Disposition: A | Discharge status: Home / Self Care | Payer: Medicaid Other | Attending: Emergency Medicine | Admitting: Emergency Medicine

## 2019-07-23 ENCOUNTER — Encounter (HOSPITAL_COMMUNITY): Payer: Self-pay | Admitting: Emergency Medicine

## 2019-07-23 DIAGNOSIS — Z20822 Contact with and (suspected) exposure to covid-19: Secondary | ICD-10-CM | POA: Insufficient documentation

## 2019-07-23 DIAGNOSIS — J40 Bronchitis, not specified as acute or chronic: Secondary | ICD-10-CM

## 2019-07-23 DIAGNOSIS — Z85038 Personal history of other malignant neoplasm of large intestine: Secondary | ICD-10-CM | POA: Insufficient documentation

## 2019-07-23 DIAGNOSIS — R05 Cough: Secondary | ICD-10-CM | POA: Diagnosis present

## 2019-07-23 DIAGNOSIS — Z720 Tobacco use: Secondary | ICD-10-CM | POA: Insufficient documentation

## 2019-07-23 DIAGNOSIS — J029 Acute pharyngitis, unspecified: Secondary | ICD-10-CM | POA: Diagnosis not present

## 2019-07-23 LAB — I-STAT BETA HCG BLOOD, ED (MC, WL, AP ONLY): I-stat hCG, quantitative: <5 m[IU]/mL (ref <5)

## 2019-07-23 LAB — SARS CORONAVIRUS 2 BY RT PCR (HOSPITAL ORDER, PERFORMED IN ~~LOC~~ HOSPITAL LAB): SARS Coronavirus 2: NEGATIVE

## 2019-07-23 MED ORDER — AZITHROMYCIN 250 MG PO TABS
250.0000 mg | ORAL_TABLET | Freq: Every day | ORAL | 0 refills | Status: DC
Start: 2019-07-23 — End: 2019-12-11

## 2019-07-23 MED ORDER — BENZONATATE 100 MG PO CAPS
100.0000 mg | ORAL_CAPSULE | Freq: Three times a day (TID) | ORAL | 0 refills | Status: DC | PRN
Start: 2019-07-23 — End: 2019-12-11

## 2019-07-23 NOTE — ED Provider Notes (Signed)
Katie Reed Provider Note   CSN: [id number] Arrival date & time: 07/23/19  1722     History Chief Complaint  Patient presents with  . Cough    NYSA Katie Reed is a 39 y.o. female presenting to the ED with complaint of productive cough x2 weeks. Pt states her symptoms began about 2 days after her 2nd COVID-19 vaccine. She has a productive cough of green sputum. Cough is persistent and bothersome. She has some sore throat as well. She denies assoc myalgias, fever, rhinorrhea.  No SOB or difficulty swallowing. OTC medications are not providing much relief.  The history is provided by the patient.       Past Medical History:  Diagnosis Date  . Anxiety   . Colon cancer ([place]) dx'd 12/2016   oral chemo comp 2019  . Depression   . Preterm labor     Patient Active Problem List   Diagnosis Date Noted  . Colon cancer ([place]) 08/07/2017  . Colonic mass 07/25/2017  . Intussusception intestine ([place]) 02/19/2017  . Depression   . Anxiety   . Active labor 04/01/2015  . ASCUS with positive high risk HPV 03/23/2015  . Supervision of high-risk pregnancy 12/31/2014  . High risk pregnancy due to history of preterm labor 12/31/2014  . AMA (advanced maternal age) multigravida 35+ 12/31/2014  . Obesity in pregnancy 12/31/2014  . Tobacco smoking affecting pregnancy, antepartum 12/31/2014  . Abnormal glucose tolerance test (GTT) during pregnancy, antepartum 12/31/2014    Past Surgical History:  Procedure Laterality Date  . FLEXIBLE SIGMOIDOSCOPY N/A 02/19/2017   Procedure: FLEXIBLE SIGMOIDOSCOPY;  Surgeon: Otis Brace, MD;  Location: [facility];  Service: Gastroenterology;  Laterality: N/A;  . NO PAST SURGERIES    . THERAPEUTIC ABORTION     elective     OB History    Gravida  6   Para  5   Term  4   Preterm  1   AB  1   Living  5     SAB  0   TAB  1   Ectopic  0   Multiple  0   Live Births  5           Family  History  Problem Relation Age of Onset  . Hypertension Mother   . Colon cancer Mother   . Colon cancer Sister   . Alcohol abuse Neg Hx   . Arthritis Neg Hx   . Asthma Neg Hx   . Birth defects Neg Hx   . Cancer Neg Hx   . COPD Neg Hx   . Depression Neg Hx   . Diabetes Neg Hx   . Drug abuse Neg Hx   . Early death Neg Hx   . Hearing loss Neg Hx   . Heart disease Neg Hx   . Hyperlipidemia Neg Hx   . Kidney disease Neg Hx   . Learning disabilities Neg Hx   . Mental illness Neg Hx   . Mental retardation Neg Hx   . Miscarriages / Stillbirths Neg Hx   . Stroke Neg Hx   . Vision loss Neg Hx   . Varicose Veins Neg Hx     Social History   Tobacco Use  . Smoking status: Current Some Day Smoker    Packs/day: 0.25    Years: 14.00    Pack years: 3.50    Types: Cigarettes  . Smokeless tobacco: Never Used  . Tobacco comment: 1-2 cigarettes  a day  Substance Use Topics  . Alcohol use: Yes    Alcohol/week: 1.0 standard drinks    Types: 1 Glasses of wine per week    Comment: 1-2 times per week  . Drug use: No    Home Medications Prior to Admission medications   Medication Sig Start Date End Date Taking? Authorizing Provider  azithromycin (ZITHROMAX) 250 MG tablet Take 1 tablet (250 mg total) by mouth daily. Take first 2 tablets together, then 1 every day until finished. 07/23/19   Robinson, [place] N, PA-C  benzonatate (TESSALON) 100 MG capsule Take 1 capsule (100 mg total) by mouth 3 (three) times daily as needed for cough. 07/23/19   Robinson, [place] N, PA-C  Multiple Vitamin (MULTIVITAMIN) tablet Take 1 tablet by mouth daily. Concieve Plus supplement    [provider]    Allergies    Patient has no known allergies.  Review of Systems   Review of Systems  HENT: Positive for sore throat.   Respiratory: Positive for cough.   All other systems reviewed and are negative.   Physical Exam Updated Vital Signs BP 128/70 (BP Location: Left Arm)   Pulse 72   Temp 98.7 F  (37.1 C) (Oral)   Resp 18   Ht 5\' 3"  (1.6 m)   Wt 86.2 kg   SpO2 99%   BMI 33.66 kg/m   Physical Exam Vitals and nursing note reviewed.  Constitutional:      General: She is not in acute distress.    Appearance: She is well-developed. She is not ill-appearing.  HENT:     Head: Normocephalic and atraumatic.     Mouth/Throat:     Comments: Mild oropharyngeal erythema, no exudates or swelling. Uvula midline, no trismus. Tolerating secretions. Eyes:     Conjunctiva/sclera: Conjunctivae normal.  Cardiovascular:     Rate and Rhythm: Normal rate and regular rhythm.  Pulmonary:     Effort: Pulmonary effort is normal. No respiratory distress.     Breath sounds: Normal breath sounds.  Musculoskeletal:     Cervical back: Neck supple. No tenderness.  Neurological:     Mental Status: She is alert.  Psychiatric:        Mood and Affect: Mood normal.        Behavior: Behavior normal.     ED Results / Procedures / Treatments   Labs (all labs ordered are listed, but only abnormal results are displayed) Labs Reviewed  SARS CORONAVIRUS 2 BY RT PCR (HOSPITAL ORDER, [facility])  I-STAT BETA HCG BLOOD, ED ([facility], [facility], [facility] ONLY)    EKG None  Radiology DG Chest 2 View  Result Date: 07/23/2019 CLINICAL DATA:  Cough for 2 weeks. EXAM: CHEST - 2 VIEW COMPARISON:  None. FINDINGS: The heart size and mediastinal contours are within normal limits. Both lungs are clear. The visualized skeletal structures are unremarkable. IMPRESSION: No active cardiopulmonary disease. Electronically Signed   By: Lovey Newcomer M.D.   On: 07/23/2019 18:36    Procedures Procedures (including critical care time)  Medications Ordered in ED Medications - No data to display  ED Course  I have reviewed the triage vital signs and the nursing notes.  Pertinent labs & imaging results that were available during my care of the patient were reviewed by me and considered in my medical decision making  (see chart for details).    MDM Rules/Calculators/A&P  KABELLA SCALLION was evaluated in [facility] on 07/23/2019 for the symptoms described in the history of present illness. She was evaluated in the context of the global COVID-19 pandemic, which necessitated consideration that the patient might be at risk for infection with the SARS-CoV-2 virus that causes COVID-19. Institutional protocols and algorithms that pertain to the evaluation of patients at risk for COVID-19 are in a state of rapid change based on information released by regulatory bodies including the CDC and federal and state organizations. These policies and algorithms were followed during the patient's care in the ED.  Patient w 2 weeks of productive cough with assoc sore throat. No fever or myalgias. Pt is well-appearing and in no distress. Lungs clear to auscultation bilaterally. CXR negative for acute infiltrate.  Will cover with z-pack. Given sx onset just 2 days after 2nd COVID-19 vaccine, will send test as a precaution. PCP follow up recommended. Verbalizes understanding and is agreeable with plan. Pt is hemodynamically stable & in NAD prior to dc.  Final Clinical Impression(s) / ED Diagnoses Final diagnoses:  Bronchitis    Rx / DC Orders ED Discharge Orders         Ordered    benzonatate (TESSALON) 100 MG capsule  3 times daily PRN     07/23/19 2109    azithromycin (ZITHROMAX) 250 MG tablet  Daily     07/23/19 2109           Robinson, [place] N, PA-C 07/23/19 2116    Hayden Rasmussen, MD 07/24/19 [phone]

## 2019-07-23 NOTE — ED Notes (Signed)
Discharge instructions reviewed with pt. Pt verbalized understanding.   

## 2019-07-23 NOTE — ED Triage Notes (Signed)
Patient arrives to ED with complaints of cough x2 weeks. Patient states that she coughs up green mucous. Patient denies fevers but states has gotten chills occasionally.

## 2019-07-23 NOTE — Discharge Instructions (Addendum)
Please read instructions below.  Drink plenty of water.  Use saline nasal spray for congestion. You can take Tessalon every 8 hours as needed for cough. Take the antibiotic, azithromycin, as directed until gone. IF YOUR COVID TEST IS POSITIVE- you do NOT need to take the antibiotic. Wash your hands frequently. Stay home until your fever has resolved/your symptoms are improving. Follow up with your primary care provider if symptoms persist. Return to the ER for inability to swallow liquids, difficulty breathing, or new or worsening symptoms.

## 2019-10-07 ENCOUNTER — Emergency Department (HOSPITAL_COMMUNITY)
Admission: EM | Admit: 2019-10-07 | Discharge: 2019-10-08 | Disposition: A | Discharge status: Home / Self Care | Payer: Medicaid Other | Attending: Emergency Medicine | Admitting: Emergency Medicine

## 2019-10-07 ENCOUNTER — Encounter (HOSPITAL_COMMUNITY): Payer: Self-pay | Admitting: Emergency Medicine

## 2019-10-07 ENCOUNTER — Other Ambulatory Visit: Payer: Self-pay

## 2019-10-07 DIAGNOSIS — Z5321 Procedure and treatment not carried out due to patient leaving prior to being seen by health care provider: Secondary | ICD-10-CM | POA: Diagnosis not present

## 2019-10-07 DIAGNOSIS — R197 Diarrhea, unspecified: Secondary | ICD-10-CM | POA: Insufficient documentation

## 2019-10-07 DIAGNOSIS — R112 Nausea with vomiting, unspecified: Secondary | ICD-10-CM | POA: Diagnosis not present

## 2019-10-07 LAB — COMPREHENSIVE METABOLIC PANEL
ALT: 21 U/L (ref 0–44)
AST: 17 U/L (ref 15–41)
Albumin: 4.1 g/dL (ref 3.5–5.0)
Alkaline Phosphatase: 40 U/L (ref 38–126)
Anion gap: 10 (ref 5–15)
BUN: 20 mg/dL (ref 6–20)
CO2: 24 mmol/L (ref 22–32)
Calcium: 9.2 mg/dL (ref 8.9–10.3)
Chloride: 106 mmol/L (ref 98–111)
Creatinine, Ser: 0.82 mg/dL (ref 0.44–1.00)
GFR calc Af Amer: >60 mL/min (ref >60)
GFR calc non Af Amer: >60 mL/min (ref >60)
Glucose, Bld: 89 mg/dL (ref 70–99)
Potassium: 3.8 mmol/L (ref 3.5–5.1)
Sodium: 140 mmol/L (ref 135–145)
Total Bilirubin: 0.5 mg/dL (ref 0.3–1.2)
Total Protein: 7.1 g/dL (ref 6.5–8.1)

## 2019-10-07 LAB — CBC WITH DIFFERENTIAL/PLATELET
Abs Immature Granulocytes: 0.01 10*3/uL (ref 0.00–0.07)
Basophils Absolute: 0.1 10*3/uL (ref 0.0–0.1)
Basophils Relative: 1 %
Eosinophils Absolute: 0.1 10*3/uL (ref 0.0–0.5)
Eosinophils Relative: 2 %
HCT: 36.4 % (ref 36.0–46.0)
Hemoglobin: 11.7 g/dL — ABNORMAL LOW (ref 12.0–15.0)
Immature Granulocytes: 0 %
Lymphocytes Relative: 42 %
Lymphs Abs: 2.5 10*3/uL (ref 0.7–4.0)
MCH: 29.8 pg (ref 26.0–34.0)
MCHC: 32.1 g/dL (ref 30.0–36.0)
MCV: 92.6 fL (ref 80.0–100.0)
Monocytes Absolute: 0.4 10*3/uL (ref 0.1–1.0)
Monocytes Relative: 7 %
Neutro Abs: 2.9 10*3/uL (ref 1.7–7.7)
Neutrophils Relative %: 48 %
Platelets: 322 10*3/uL (ref 150–400)
RBC: 3.93 MIL/uL (ref 3.87–5.11)
RDW: 13.5 % (ref 11.5–15.5)
WBC: 6 10*3/uL (ref 4.0–10.5)
nRBC: 0 % (ref 0.0–0.2)

## 2019-10-07 LAB — URINALYSIS, ROUTINE W REFLEX MICROSCOPIC
Bacteria, UA: NONE SEEN
Bilirubin Urine: NEGATIVE
Glucose, UA: NEGATIVE mg/dL
Ketones, ur: NEGATIVE mg/dL
Leukocytes,Ua: NEGATIVE
Nitrite: NEGATIVE
Protein, ur: NEGATIVE mg/dL
Specific Gravity, Urine: 1.027 (ref 1.005–1.030)
pH: 6 (ref 5.0–8.0)

## 2019-10-07 LAB — I-STAT BETA HCG BLOOD, ED (MC, WL, AP ONLY): I-stat hCG, quantitative: <5 m[IU]/mL (ref <5)

## 2019-10-07 NOTE — ED Triage Notes (Signed)
Pt to ED with c/o nausea, vomiting and diarrhea x's 2 days.  Pt st's she feels shaky

## 2019-10-07 NOTE — ED Notes (Signed)
Pt turned in labels to registration. Has left.

## 2019-12-09 ENCOUNTER — Other Ambulatory Visit: Payer: Self-pay

## 2019-12-09 ENCOUNTER — Inpatient Hospital Stay: Payer: Medicaid Other | Attending: Oncology

## 2019-12-09 ENCOUNTER — Ambulatory Visit (HOSPITAL_COMMUNITY)
Admission: RE | Admit: 2019-12-09 | Discharge: 2019-12-09 | Disposition: A | Discharge status: Home / Self Care | Payer: Medicaid Other | Source: Ambulatory Visit | Attending: Oncology | Admitting: Oncology

## 2019-12-09 DIAGNOSIS — Z9221 Personal history of antineoplastic chemotherapy: Secondary | ICD-10-CM | POA: Diagnosis not present

## 2019-12-09 DIAGNOSIS — C187 Malignant neoplasm of sigmoid colon: Secondary | ICD-10-CM | POA: Insufficient documentation

## 2019-12-09 DIAGNOSIS — Z85038 Personal history of other malignant neoplasm of large intestine: Secondary | ICD-10-CM | POA: Diagnosis present

## 2019-12-09 DIAGNOSIS — Z9049 Acquired absence of other specified parts of digestive tract: Secondary | ICD-10-CM | POA: Insufficient documentation

## 2019-12-09 LAB — CEA (IN HOUSE-CHCC): CEA (CHCC-In House): 1.99 ng/mL (ref 0.00–5.00)

## 2019-12-11 ENCOUNTER — Inpatient Hospital Stay (HOSPITAL_BASED_OUTPATIENT_CLINIC_OR_DEPARTMENT_OTHER): Payer: Medicaid Other | Admitting: Oncology

## 2019-12-11 ENCOUNTER — Other Ambulatory Visit: Payer: Self-pay

## 2019-12-11 VITALS — BP 109/76 | HR 60 | Temp 97.1°F | Resp 17 | Ht 63.0 in | Wt 192.0 lb

## 2019-12-11 DIAGNOSIS — C187 Malignant neoplasm of sigmoid colon: Secondary | ICD-10-CM

## 2019-12-11 DIAGNOSIS — Z85038 Personal history of other malignant neoplasm of large intestine: Secondary | ICD-10-CM | POA: Diagnosis not present

## 2019-12-11 NOTE — Progress Notes (Signed)
[place] OFFICE PROGRESS NOTE   Diagnosis: Colon cancer  INTERVAL HISTORY:   Katie Reed returns for a scheduled visit.  She reports generally feeling well.  No nausea or vomiting.  She reports a poor appetite.  No difficulty with bowel function.  No bleeding.  She has intermittent episodes of feeling hot.  No fever.  Objective:  Vital signs in last 24 hours:  Blood pressure 109/76, pulse 60, temperature (!) 97.1 F (36.2 C), temperature source Tympanic, resp. rate 17, height _0  (1.6 m), weight 192 lb (87.1 kg), SpO2 100 %.    HEENT: Neck without mass Lymphatics: No cervical, supraclavicular, axillary, or inguinal nodes Resp: Lungs clear bilaterally Cardio: Regular rate and rhythm GI: Nontender, no mass, no hepatosplenomegaly Vascular: No leg edema   Lab Results:  Lab Results  Component Value Date   WBC 6.0 10/07/2019   HGB 11.7 (L) 10/07/2019   HCT 36.4 10/07/2019   MCV 92.6 10/07/2019   PLT 322 10/07/2019   NEUTROABS 2.9 10/07/2019    CMP  Lab Results  Component Value Date   NA 140 10/07/2019   K 3.8 10/07/2019   CL 106 10/07/2019   CO2 24 10/07/2019   GLUCOSE 89 10/07/2019   BUN 20 10/07/2019   CREATININE 0.82 10/07/2019   CALCIUM 9.2 10/07/2019   PROT 7.1 10/07/2019   ALBUMIN 4.1 10/07/2019   AST 17 10/07/2019   ALT 21 10/07/2019   ALKPHOS 40 10/07/2019   BILITOT 0.5 10/07/2019   GFRNONAA >60 10/07/2019   GFRAA >60 10/07/2019    Lab Results  Component Value Date   CEA1 1.99 12/09/2019    Imaging:  CT CHEST WO CONTRAST  Result Date: 12/09/2019 CLINICAL DATA:  History of colon cancer, follow-up lung nodule EXAM: CT CHEST WITHOUT CONTRAST TECHNIQUE: Multidetector CT imaging of the chest was performed following the standard protocol without IV contrast. COMPARISON:  12/04/2018 FINDINGS: Cardiovascular: No significant vascular findings. Normal heart size. No pericardial effusion. Mediastinum/Nodes: No enlarged mediastinal,  hilar, or axillary lymph nodes. Thymic remnant in the anterior mediastinum. Thyroid gland, trachea, and esophagus demonstrate no significant findings. Lungs/Pleura: Stable tiny 2 mm subpleural nodule of the anterior right upper lobe (series 7, image 43). No new pulmonary nodules. No pleural effusion or pneumothorax. Upper Abdomen: No acute abnormality. Musculoskeletal: No chest wall mass or suspicious bone lesions identified. IMPRESSION: Stable tiny 2 mm subpleural nodule of the anterior right upper lobe. No new pulmonary nodules or evidence of metastatic disease in the chest. This nodule is almost certainly incidental and benign, and no further routine CT follow-up is required specifically for this nodule. Ongoing CT imaging of the chest if indicated by clinical oncology protocol given history of colon cancer. Electronically Signed   By: Eddie Candle M.D.   On: 12/09/2019 14:09    Medications: I have reviewed the patient's current medications.   Assessment/Plan: 1. 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 0/25 lymph nodes positive ? Tumor penetrated through the muscularis to within a cell of the serosal surface per personal discussion with the pathologist, therefore clinical impression is of anearly T4 lesion ? Cycle 1 adjuvant Xeloda 08/13/2017 ? Cycle 2 adjuvant Xeloda 09/03/2017 ? Cycle 3 adjuvantXeloda 09/24/2017 ? Cycle 4adjuvant Xeloda 10/15/2017 ? Cycle 5 adjuvant Xeloda 11/04/2017 ? Cycle 6 adjuvant Xeloda 11/26/2017 ? Cycle 7 adjuvant Xeloda 12/17/2017 ? Cycle 8 adjuvant Xeloda 01/07/2018 ?  CTs 07/25/2018- no metastatic disease; solitary tiny to minimally solid right upper lobe pulmonary nodule  ? CT chest 12/04/2018- stable 2 mm subpleural nodule anterior right upper lobe. ? CT abdomen/pelvis 05/20/2019-mildly dilated versus thickened  appendix, small amount of nonspecific pelvic fluid, right ovarian cyst ? CT chest 12/09/2019-stable 2 mm subpleural anterior right upper lobe nodule, no new nodules, no evidence of metastatic disease, CT follow-up of this nodule not recommended 2. Transverse colon polyp noted on colonoscopy 03/13/2017, tubular adenoma   Colonoscopy 07/30/2018-polyp removed from the sigmoid colon-hyperplastic polyp    Disposition: Katie Reed remains in clinical remission from colon cancer.  The restaging chest CT revealed no evidence of disease progression.  She will return for an office visit and restaging CTs of the chest and abdomen/pelvis in 6 months.  Betsy Coder, MD  12/11/2019  11:53 AM

## 2019-12-12 ENCOUNTER — Telehealth: Payer: Self-pay | Admitting: Oncology

## 2019-12-12 NOTE — Telephone Encounter (Signed)
Scheduled per 10/14 los. Unable to reach pt. Left voicemail with appt times and dates. 

## 2020-01-02 ENCOUNTER — Ambulatory Visit (INDEPENDENT_AMBULATORY_CARE_PROVIDER_SITE_OTHER): Payer: Medicaid Other | Admitting: Obstetrics & Gynecology

## 2020-01-02 ENCOUNTER — Encounter: Payer: Self-pay | Admitting: Obstetrics & Gynecology

## 2020-01-02 ENCOUNTER — Other Ambulatory Visit: Payer: Self-pay

## 2020-01-02 VITALS — BP 107/72 | HR 64 | Ht 63.0 in | Wt 194.0 lb

## 2020-01-02 DIAGNOSIS — R102 Pelvic and perineal pain: Secondary | ICD-10-CM | POA: Diagnosis not present

## 2020-01-02 MED ORDER — CYCLOBENZAPRINE HCL 5 MG PO TABS: 5.0000 mg | ORAL_TABLET | Freq: Three times a day (TID) | ORAL | 0 refills | Status: DC | PRN

## 2020-01-02 NOTE — Progress Notes (Signed)
Patient ID: Katie Reed, female   DOB: September 15, 1980, 39 y.o.   MRN: [id number]  Chief Complaint  Patient presents with  . Pelvic Pain    HPI Katie Reed is a 39 y.o. female.  [id number] Patient's last menstrual period was 12/15/2019. She has chronic pelvic and low back pain for several years but seems worse. Hard to find comfortable position and no dyspareunia HPI  Past Medical History:  Diagnosis Date  . Anxiety   . Colon cancer ([place]) dx'd 12/2016   oral chemo comp 2019  . Depression   . Preterm labor     Past Surgical History:  Procedure Laterality Date  . FLEXIBLE SIGMOIDOSCOPY N/A 02/19/2017   Procedure: FLEXIBLE SIGMOIDOSCOPY;  Surgeon: Otis Brace, MD;  Location: [facility];  Service: Gastroenterology;  Laterality: N/A;  . NO PAST SURGERIES    . THERAPEUTIC ABORTION     elective    Family History  Problem Relation Age of Onset  . Hypertension Mother   . Colon cancer Mother   . Colon cancer Sister   . Alcohol abuse Neg Hx   . Arthritis Neg Hx   . Asthma Neg Hx   . Birth defects Neg Hx   . Cancer Neg Hx   . COPD Neg Hx   . Depression Neg Hx   . Diabetes Neg Hx   . Drug abuse Neg Hx   . Early death Neg Hx   . Hearing loss Neg Hx   . Heart disease Neg Hx   . Hyperlipidemia Neg Hx   . Kidney disease Neg Hx   . Learning disabilities Neg Hx   . Mental illness Neg Hx   . Mental retardation Neg Hx   . Miscarriages / Stillbirths Neg Hx   . Stroke Neg Hx   . Vision loss Neg Hx   . Varicose Veins Neg Hx     Social History Social History   Tobacco Use  . Smoking status: Current Some Day Smoker    Packs/day: 0.25    Years: 14.00    Pack years: 3.50    Types: Cigarettes  . Smokeless tobacco: Never Used  . Tobacco comment: 1-2 cigarettes a day  Vaping Use  . Vaping Use: Never used  Substance Use Topics  . Alcohol use: Yes    Alcohol/week: 1.0 standard drink    Types: 1 Glasses of wine per week    Comment: 1-2 times per week  . Drug use:  No    No Known Allergies  Current Outpatient Medications  Medication Sig Dispense Refill  . cyclobenzaprine (FLEXERIL) 5 MG tablet Take 1 tablet (5 mg total) by mouth 3 (three) times daily as needed for muscle spasms. 20 tablet 0  . Multiple Vitamin (MULTIVITAMIN) tablet Take 1 tablet by mouth daily. Concieve Plus supplement     No current facility-administered medications for this visit.    Review of Systems Review of Systems  Gastrointestinal: Positive for abdominal distention and abdominal pain.  Genitourinary: Positive for pelvic pain. Negative for difficulty urinating, dyspareunia, dysuria, vaginal bleeding and vaginal discharge.  Musculoskeletal: Positive for back pain.    Blood pressure 107/72, pulse 64, height 5\' 3"  (1.6 m), weight 194 lb (88 kg), last menstrual period 12/15/2019.  Physical Exam Physical Exam Constitutional:      Appearance: Normal appearance.  Cardiovascular:     Rate and Rhythm: Normal rate.  Pulmonary:     Effort: Pulmonary effort is normal.  Abdominal:     Palpations:  Abdomen is soft.  Genitourinary:    General: Normal vulva.     Comments: Uterus 6 week size mild tenderness no mass Neurological:     Mental Status: She is alert.     Data Reviewed Narrative & Impression  CLINICAL DATA:  Cramping pain. LMP on 07/10/2019. RIGHT ovarian cyst seen on CT on 05/20/2019.  EXAM:.: EXAM:. TRANSABDOMINAL AND TRANSVAGINAL ULTRASOUND OF PELVIS  TECHNIQUE: Both transabdominal and transvaginal ultrasound examinations of the pelvis were performed. Transabdominal technique was performed for global imaging of the pelvis including uterus, ovaries, adnexal regions, and pelvic cul-de-sac. It was necessary to proceed with endovaginal exam following the transabdominal exam to visualize the uterus, endometrium, ovaries.  COMPARISON:  CT 05/20/2019  FINDINGS: Uterus  Measurements: 8.5 x 4.1 x 4.7 centimeter = volume: 86.75 mL. No fibroids or  other mass visualized.  Endometrium  Thickness: Echogenic, thin endometrium without focal mass. Endometrium measures 7 millimeters in thickness.  Right ovary  Measurements: 4.1 x 2.4 x 2.2 centimeter = volume: 11.1 mL. Normal appearance/no adnexal mass. Largest follicle measures 1.6 centimeters.  Left ovary  Measurements: 2.5 x 2.3 x 2.0 centimeters = volume: 6.1 mL. Normal appearance/no adnexal mass.  Other findings  No abnormal free fluid.  IMPRESSION: No acute abnormality.  Interval resolution of RIGHT adnexal cyst. Largest follicle on the RIGHT now 1.6 centimeters. No follow-up is needed.   Electronically Signed   By: Nolon Nations M.D.   On: 07/18/2019 09:30     Assessment Chronic pelvic and back pain may be MS but she has mild tenderness pelvis. Asks to try Flexeril  Plan Orders Placed This Encounter  Procedures  . MM Digital Screening    Standing Status:   Future    Standing Expiration Date:   01/01/2021    Order Specific Question:   Reason for Exam (SYMPTOM  OR DIAGNOSIS REQUIRED)    Answer:   screening    Order Specific Question:   Is the patient pregnant?    Answer:   No    Order Specific Question:   Preferred imaging location?    Answer:   [facility]  . US PELVIC COMPLETE WITH TRANSVAGINAL    Standing Status:   Future    Standing Expiration Date:   01/01/2021    Order Specific Question:   Reason for Exam (SYMPTOM  OR DIAGNOSIS REQUIRED)    Answer:   pelvic pain    Order Specific Question:   Preferred imaging location?    Answer:   [facility]    Order Specific Question:   Release to patient    Answer:   Immediate   Current Outpatient Medications on File Prior to Visit  Medication Sig Dispense Refill  . Multiple Vitamin (MULTIVITAMIN) tablet Take 1 tablet by mouth daily. Concieve Plus supplement     No current facility-administered medications on file prior to visit.   Meds ordered this encounter  Medications  .  cyclobenzaprine (FLEXERIL) 5 MG tablet    Sig: Take 1 tablet (5 mg total) by mouth 3 (three) times daily as needed for muscle spasms.    Dispense:  20 tablet    Refill:  0   RTC 4 weeks after trial of med and [place]    Katie Reed 01/02/2020, 11:55 AM

## 2020-01-02 NOTE — Progress Notes (Signed)
Here for c/o pelvic pain that she has been having for a few years and it has been getting worse. States went to chiropractor but too expensive.  Elevated phq9, offered bhc. Declines bhc.  Bacilio Abascal,RN

## 2020-01-02 NOTE — Patient Instructions (Signed)
Pelvic Exam A pelvic exam is an exam of a woman's outer and inner genitals and reproductive organs. Pelvic exams are done to screen for health problems and to help prevent health problems from developing. During your pelvic exam, your health care provider may ask you questions about your health, your family's health, your menstrual periods, immunizations, and your sexual activity. The information shared between you and your health care provider will not be shared with anyone else. Who should have a pelvic exam? Talk with your health care provider about when and how often you should have a pelvic exam. There are many possible reasons you might have a pelvic exam. A pelvic exam may be recommended to check for:  Normal development and function of the reproductive organs.  Cancer of the cervix, ovaries, uterus, or vagina.  Signs of sexually transmitted infections (STIs) or other types of infections.  Pregnancy. If you are pregnant, a pelvic exam can also help determine how far along you are in your pregnancy.  Widening (dilation) of the cervix during labor.  Injury (trauma) to the reproductive organs. How is a pelvic exam done? Usually, a physical exam is done first. This may include:  An exam of your breasts. Your health care provider may feel your breasts to check for abnormalities.  An exam of your abdomen. Your health care provider may press on your abdomen to check for abnormalities. Pelvic exams may vary among health care providers and hospitals. The following things are usually done during a pelvic exam:  You will remove your clothes from the waist down. You will put on a gown or a wrap to cover yourself while you get ready for the exam.  You will lie on your back on an examination table. Your feet will be placed into foot rests (stirrups) so that your legs are wide apart and your knees are bent. A drape will be placed over your abdomen and your legs.  Your health care provider will  wear gloves and examine your outer genitals to check for anything unusual. This includes your clitoris, urethra, vaginal opening, labia, and the skin between your vagina and your anus (perineum).  Your health care provider will examine your inner genitals. To do this, a lubricated instrument (speculum) will be inserted into your vagina. The speculum will be widened to open the walls of your vagina. ? Your health care provider will examine your vagina and cervix. ? A Pap test, cervical biopsy, or cultures may be done as needed. ? After the internal exam is done, the speculum will be removed.  Your health care provider will insert two fingers into your vagina to gently press against various organs. ? Your health care provider may use his or her other hand to gently press on your lower abdomen while doing this. Depending on the purpose of your pelvic exam, your health care provider may perform:  A Pap test. This is sometimes called a Pap smear. It is a screening test that is used to check for signs of cancer of the cervix. The test can also identify the presence of infection or precancerous changes.  A cervical biopsy. This is the removal of a small sample of tissue from the cervix. The cervix is the lowest part of the womb (uterus), which opens into the vagina (birth canal). The tissue will be checked under a microscope.  Other diagnostic tests that involve taking samples of tissue or fluid (cultures).  If you have tests done, it is your responsibility to  get your test results. Ask your health care provider or the department performing the test when your results will be ready. What are the benefits of a pelvic exam? A pelvic exam may be recommended to help explain or diagnose:  Changes in your body that may be signs of cancer in the reproductive system.  Inability to get pregnant (infertility).  Vaginal itching or burning.  Abnormal vaginal discharge or bleeding.  Problems with sexual  function.  Problems with urination, such as: ? Painful urination. ? Frequent urinary tract infections. ? Inability to control when you urinate (urinary incontinence).  Problems with menstrual periods, such as: ? Severe cramping. ? Absence of any menstrual flow in a female by the age of 19 years (primary amenorrhea). ? Absence of menstrual flow for 3-6 months at a time (secondary amenorrhea).  Problems with the position of your pelvic organs due to weakening muscles (prolapse), such as: ? Uterine prolapse. ? Bladder prolapse. ? Rectal prolapse. What are the risks of a pelvic exam?  A pelvic exam is usually painless, although it can cause mild discomfort. If you experience pain at any time during your pelvic exam, tell your health care provider right away.  You may have very light bleeding after a pelvic exam, particularly if a biopsy or cultures were obtained. When should I seek medical care? Seek medical care after your pelvic exam if:  You develop new symptoms.  You experience pain or discomfort from anything that was done during your pelvic exam. Summary  A pelvic exam is an exam of a woman's outer and inner genitals and reproductive organs.  A pelvic exam may be recommended to help explain or diagnose various problems with your pelvic organs.  You may experience mild discomfort during a pelvic exam. This information is not intended to replace advice given to you by your health care provider. Make sure you discuss any questions you have with your health care provider. Document Revised: 07/03/2017 Document Reviewed: 07/03/2017 Elsevier Patient Education  [place].

## 2020-01-19 ENCOUNTER — Ambulatory Visit
Admission: RE | Admit: 2020-01-19 | Discharge: 2020-01-19 | Disposition: A | Discharge status: Home / Self Care | Payer: Medicaid Other | Source: Ambulatory Visit | Attending: Obstetrics & Gynecology | Admitting: Obstetrics & Gynecology

## 2020-01-19 ENCOUNTER — Other Ambulatory Visit: Payer: Self-pay

## 2020-01-19 DIAGNOSIS — R102 Pelvic and perineal pain: Secondary | ICD-10-CM | POA: Diagnosis not present

## 2020-02-24 ENCOUNTER — Ambulatory Visit
Admission: RE | Admit: 2020-02-24 | Discharge: 2020-02-24 | Disposition: A | Discharge status: Home / Self Care | Payer: Medicaid Other | Source: Ambulatory Visit | Attending: Obstetrics & Gynecology | Admitting: Obstetrics & Gynecology

## 2020-02-24 ENCOUNTER — Other Ambulatory Visit: Payer: Self-pay | Admitting: Obstetrics & Gynecology

## 2020-02-24 ENCOUNTER — Other Ambulatory Visit: Payer: Self-pay

## 2020-02-24 DIAGNOSIS — R102 Pelvic and perineal pain: Secondary | ICD-10-CM

## 2020-02-26 IMAGING — CT CT CHEST WITH CONTRAST
2 of 4 series · 13 of 36 positions shown, 16 images · IV contrast (OMNIPAQUE)
Comparison: 04/19/2017 CT abdomen/pelvis. 12/24/2015 chest
radiograph.

CLINICAL DATA: Stage IIB sigmoid colon cancer status post
sigmoidectomy 07/25/2017 with adjuvant chemotherapy. Restaging.

EXAM:
CT CHEST, ABDOMEN, AND PELVIS WITH CONTRAST
TECHNIQUE: Multidetector CT imaging of the chest, abdomen and pelvis was
performed following the standard protocol during bolus
administration of intravenous contrast.
CONTRAST:  100mL OMNIPAQUE IOHEXOL 300 MG/ML SOLN, 30mL OMNIPAQUE
IOHEXOL 300 MG/ML SOLN

[Series 2: cap with · axial · 0.94mm/px · z∈[-634,-139]mm · 10 of 119 slices shown, 13 images]
[im 10/119  mediastinal]
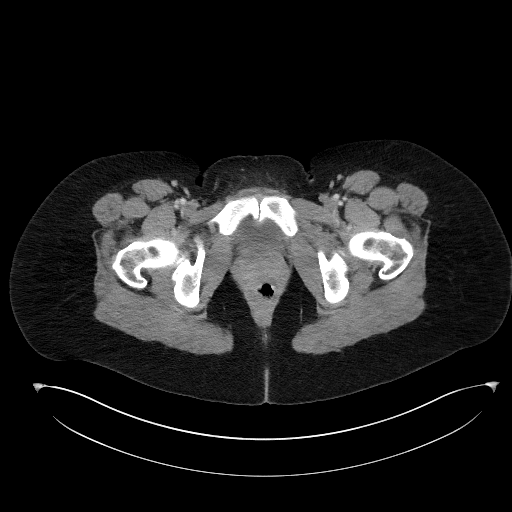
[im 10/119  lung]
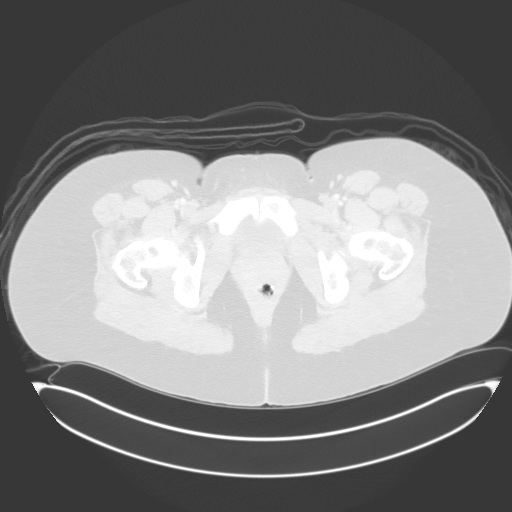
[im 20/119  lung]
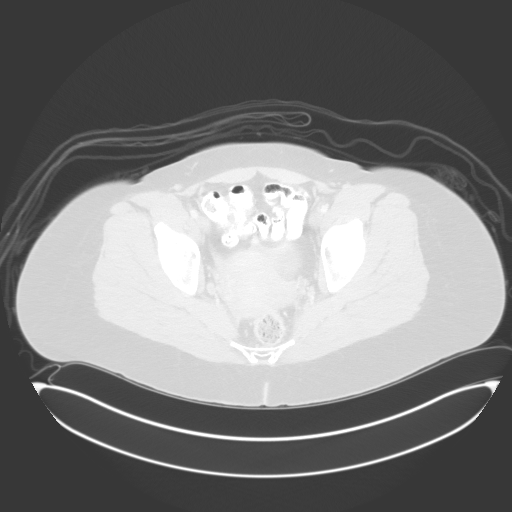
[im 30/119  lung]
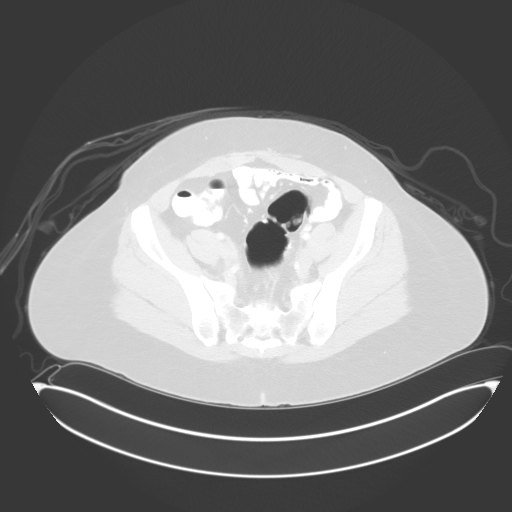
[im 40/119  lung]
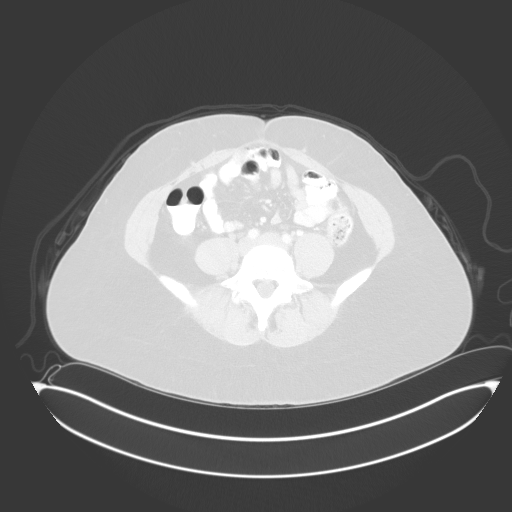
[im 50/119  mediastinal]
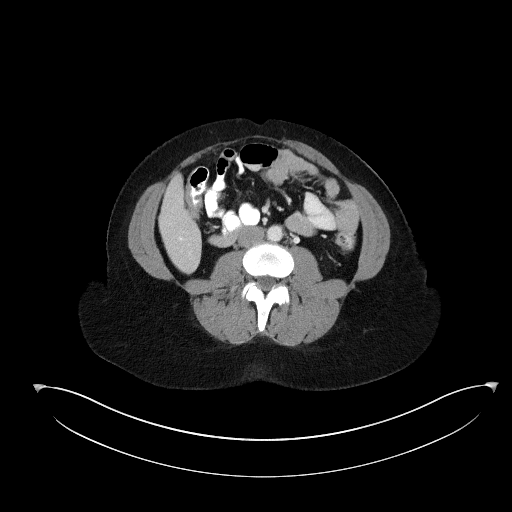
[im 50/119  lung]
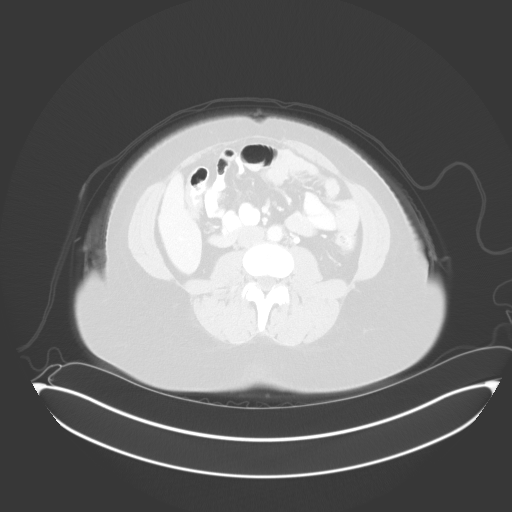
[im 69/119  lung]
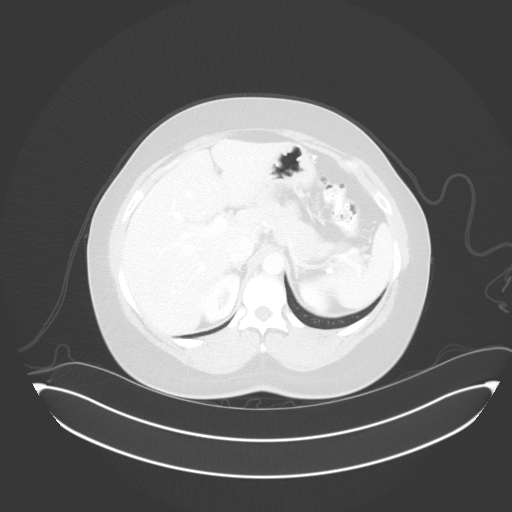
[im 79/119  lung]
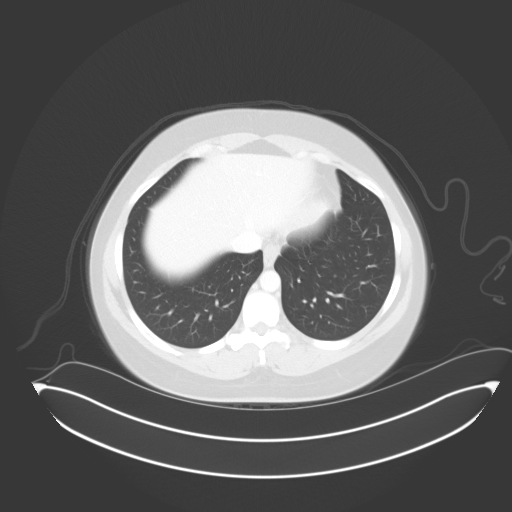
[im 89/119  lung]
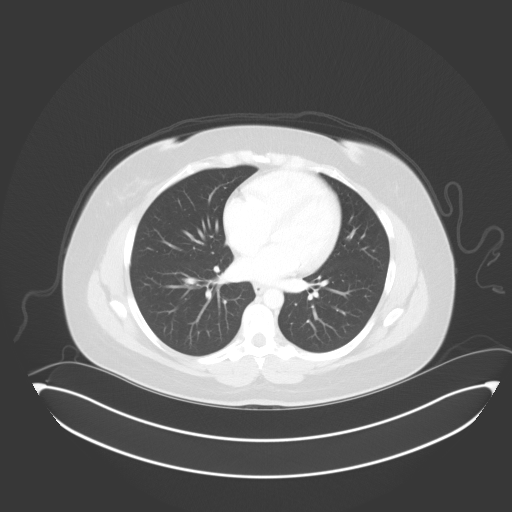
[im 99/119  mediastinal]
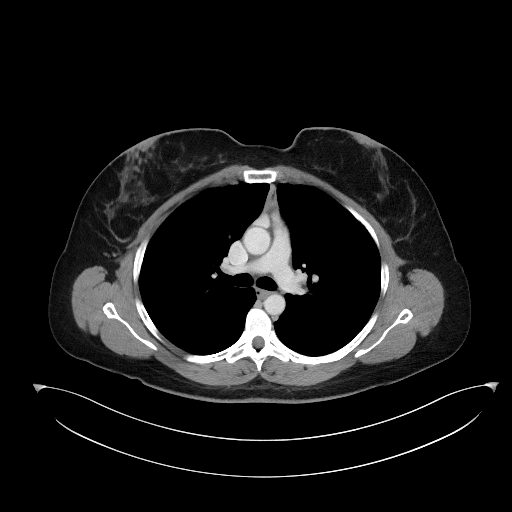
[im 99/119  lung]
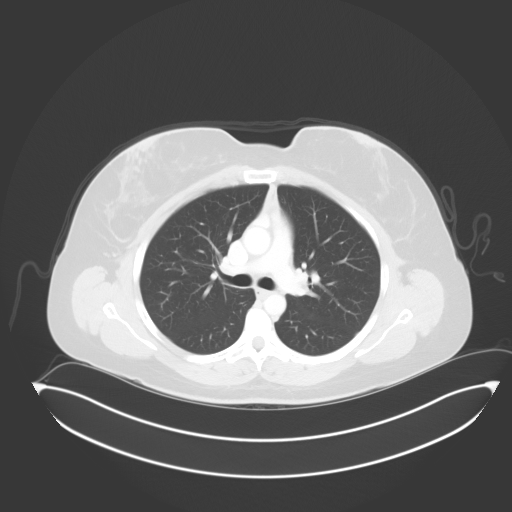
[im 109/119  lung]
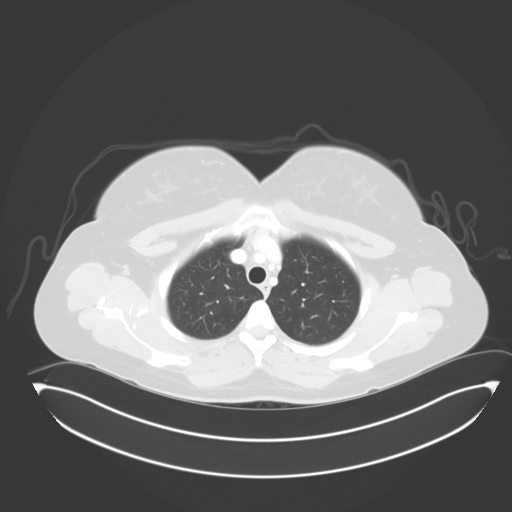

[Series 5: coronals · coronal · 0.67mm/px · 3 of 139 slices shown]
[im 28/139  lung]
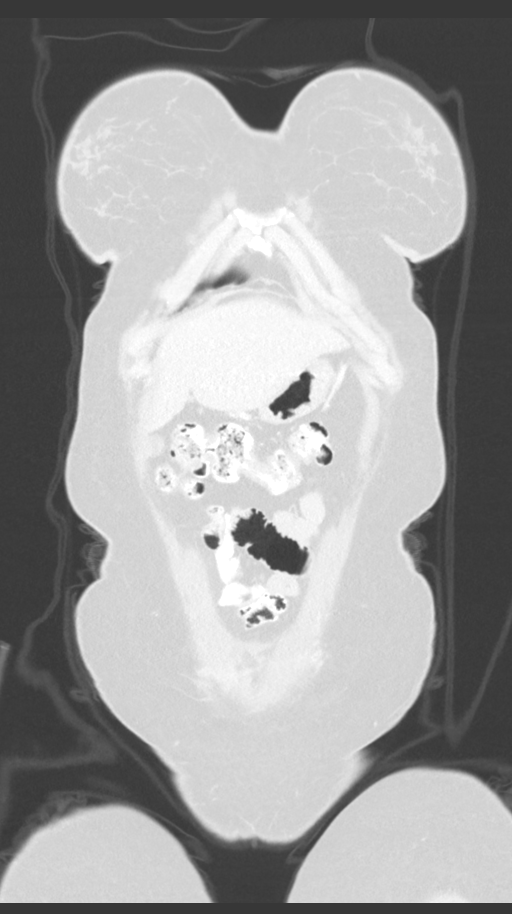
[im 56/139  lung]
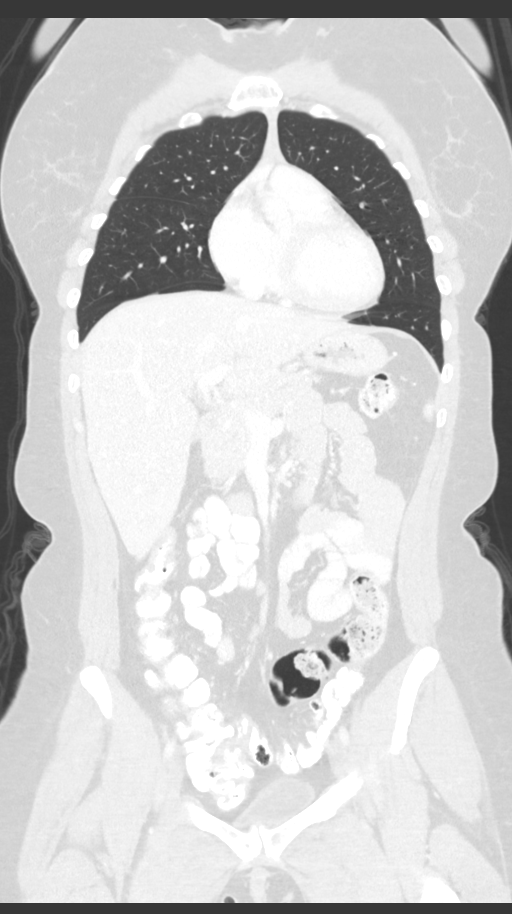
[im 83/139  lung]
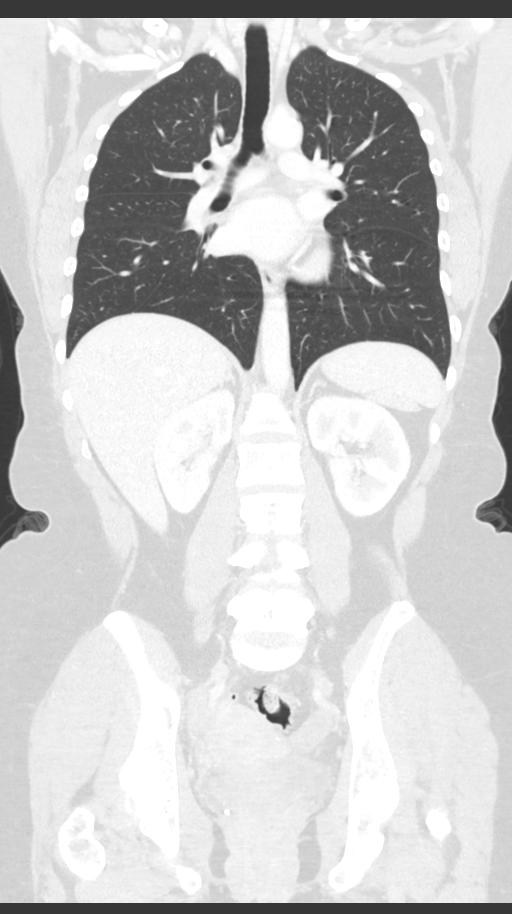

[13 of 36 positions shown; findings below may reference images not displayed]

FINDINGS: CT CHEST FINDINGS

Cardiovascular: Normal heart size. No significant pericardial
effusion/thickening. Great vessels are normal in course and caliber.
No central pulmonary emboli.

Mediastinum/Nodes: No discrete thyroid nodules. Unremarkable
esophagus. No pathologically enlarged axillary, mediastinal or hilar
lymph nodes. Triangular soft tissue in the anterior mediastinum with
concave margins (series 2/image 16), compatible with mild thymic
hyperplasia.

Lungs/Pleura: No pneumothorax. No pleural effusion. No acute
consolidative airspace disease or lung masses. Subpleural anterior
right upper lobe 2 mm solid pulmonary nodule (series 4/image 36). No
additional significant pulmonary nodules.

Musculoskeletal:  No aggressive appearing focal osseous lesions.

CT ABDOMEN PELVIS FINDINGS

Hepatobiliary: Normal liver with no liver mass. Normal gallbladder
with no radiopaque cholelithiasis. No biliary ductal dilatation.

Pancreas: Normal, with no mass or duct dilation.

Spleen: Normal size. No mass.

Adrenals/Urinary Tract: Normal adrenals. Normal kidneys with no
hydronephrosis and no renal mass. Normal bladder.

Stomach/Bowel: Normal non-distended stomach. Normal caliber small
bowel with no small bowel wall thickening. Normal appendix. Status
post sigmoid colon resection with intact distal colonic anastomosis.
No anastomotic wall thickening or mass. No wall thickening,
diverticulosis or acute pericolonic fat stranding in the remnant
large-bowel.

Vascular/Lymphatic: Normal caliber abdominal aorta. Patent portal,
splenic, hepatic and renal veins. No pathologically enlarged lymph
nodes in the abdomen or pelvis.

Reproductive: Simple 1.8 cm left adnexal cyst, considered benign. No
suspicious adnexal masses. Grossly normal uterus.

Other: No pneumoperitoneum, ascites or focal fluid collection.

Musculoskeletal: No aggressive appearing focal osseous lesions.
IMPRESSION: 1. No findings highly suspicious for metastatic disease in the
chest, abdomen or pelvis.
2. Solitary tiny 2 mm solid right upper lobe pulmonary nodule, more
likely benign given subpleural location. Given absence of prior
chest CT studies for comparison, initial follow-up chest CT
recommended in 3-6 months.
3. Status post sigmoid colon resection with no evidence of
anastomotic recurrence.
4. Mild thymic hyperplasia.

## 2020-05-24 ENCOUNTER — Inpatient Hospital Stay: Payer: Medicaid Other | Attending: Nurse Practitioner

## 2020-05-24 ENCOUNTER — Other Ambulatory Visit: Payer: Self-pay

## 2020-05-24 ENCOUNTER — Telehealth: Payer: Self-pay | Admitting: Nurse Practitioner

## 2020-05-24 ENCOUNTER — Ambulatory Visit (HOSPITAL_COMMUNITY)
Admission: RE | Admit: 2020-05-24 | Discharge: 2020-05-24 | Disposition: A | Discharge status: Home / Self Care | Payer: Medicaid Other | Source: Ambulatory Visit | Attending: Oncology | Admitting: Oncology

## 2020-05-24 DIAGNOSIS — Z85038 Personal history of other malignant neoplasm of large intestine: Secondary | ICD-10-CM | POA: Diagnosis present

## 2020-05-24 DIAGNOSIS — C187 Malignant neoplasm of sigmoid colon: Secondary | ICD-10-CM

## 2020-05-24 LAB — BASIC METABOLIC PANEL - CANCER CENTER ONLY
Anion gap: 11 (ref 5–15)
BUN: 15 mg/dL (ref 6–20)
CO2: 23 mmol/L (ref 22–32)
Calcium: 8.7 mg/dL — ABNORMAL LOW (ref 8.9–10.3)
Chloride: 107 mmol/L (ref 98–111)
Creatinine: 0.92 mg/dL (ref 0.44–1.00)
GFR, Estimated: >60 mL/min (ref >60)
Glucose, Bld: 97 mg/dL (ref 70–99)
Potassium: 3.8 mmol/L (ref 3.5–5.1)
Sodium: 141 mmol/L (ref 135–145)

## 2020-05-24 LAB — CEA (IN HOUSE-CHCC): CEA (CHCC-In House): 2.01 ng/mL (ref 0.00–5.00)

## 2020-05-24 MED ORDER — IOHEXOL 300 MG/ML  SOLN
100.0000 mL | Freq: Once | INTRAMUSCULAR | Status: AC | PRN
  Administered 2020-05-24: 100 mL via INTRAVENOUS

## 2020-05-24 NOTE — Telephone Encounter (Signed)
Scheduled appt per 3/28 sch msg. Pt aware.  

## 2020-05-25 ENCOUNTER — Inpatient Hospital Stay: Payer: Medicaid Other | Admitting: Nurse Practitioner

## 2020-05-28 ENCOUNTER — Telehealth: Payer: Self-pay | Admitting: Oncology

## 2020-05-28 NOTE — Telephone Encounter (Signed)
[facility] call made

## 2020-05-31 ENCOUNTER — Other Ambulatory Visit: Payer: Self-pay

## 2020-05-31 ENCOUNTER — Encounter: Payer: Self-pay | Admitting: Nurse Practitioner

## 2020-05-31 ENCOUNTER — Inpatient Hospital Stay: Payer: Medicaid Other | Attending: Nurse Practitioner | Admitting: Nurse Practitioner

## 2020-05-31 VITALS — BP 118/61 | HR 68 | Temp 98.4°F | Resp 20 | Ht 63.0 in | Wt 193.2 lb

## 2020-05-31 DIAGNOSIS — M545 Low back pain, unspecified: Secondary | ICD-10-CM | POA: Insufficient documentation

## 2020-05-31 DIAGNOSIS — Z85038 Personal history of other malignant neoplasm of large intestine: Secondary | ICD-10-CM | POA: Diagnosis present

## 2020-05-31 DIAGNOSIS — Z9221 Personal history of antineoplastic chemotherapy: Secondary | ICD-10-CM | POA: Diagnosis not present

## 2020-05-31 DIAGNOSIS — C187 Malignant neoplasm of sigmoid colon: Secondary | ICD-10-CM

## 2020-05-31 DIAGNOSIS — G8929 Other chronic pain: Secondary | ICD-10-CM | POA: Insufficient documentation

## 2020-05-31 DIAGNOSIS — Z9049 Acquired absence of other specified parts of digestive tract: Secondary | ICD-10-CM | POA: Diagnosis not present

## 2020-05-31 NOTE — Progress Notes (Signed)
  Katie Reed   Diagnosis: Colon cancer  INTERVAL HISTORY:   Katie Reed returns as scheduled.  No change in bowel habits.  No blood or pain with bowel movements.  No abdominal pain.  She has a good appetite.  No nausea or vomiting.  She continues to have achy discomfort at the right low back.  She reports mild chronic right low back pain for many years, worse over the past 6 months.  Pain increases when she lays flat.  She takes Flexeril at nighttime with some relief.  Objective:  Vital signs in last 24 hours:  Blood pressure 118/61, pulse 68, temperature 98.4 F (36.9 C), temperature source Tympanic, resp. rate 20, height 5' 3" (1.6 m), weight 193 lb 3.2 oz (87.6 kg), SpO2 100 %.    HEENT: Neck without mass. Lymphatics: No palpable cervical, supraclavicular, axillary or inguinal lymph nodes. Resp: Lungs clear bilaterally. Cardio: Regular rate and rhythm. GI: Abdomen soft and nontender.  No hepatomegaly. Vascular: No leg edema.     Lab Results:  Lab Results  Component Value Date   WBC 6.0 10/07/2019   HGB 11.7 (L) 10/07/2019   HCT 36.4 10/07/2019   MCV 92.6 10/07/2019   PLT 322 10/07/2019   NEUTROABS 2.9 10/07/2019    Imaging:  No results found.  Medications: I have reviewed the patient's current medications.  Assessment/Plan: 1. 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 0/25 lymph nodes positive ? Tumor penetrated through the muscularis to within a cell of the serosal surface per personal discussion with the pathologist, therefore clinical impression is of anearly T4 lesion ? Cycle 1 adjuvant Xeloda 08/13/2017 ? Cycle 2 adjuvant Xeloda 09/03/2017 ? Cycle 3 adjuvantXeloda 09/24/2017 ? Cycle 4adjuvant Xeloda 10/15/2017 ? Cycle 5 adjuvant Xeloda 11/04/2017 ? Cycle 6 adjuvant Xeloda  11/26/2017 ? Cycle 7 adjuvant Xeloda 12/17/2017 ? Cycle 8 adjuvant Xeloda 01/07/2018 ? CTs 07/25/2018- no metastatic disease; solitary tiny to minimally solid right upper lobe pulmonary nodule ? CT chest 12/04/2018-stable 2 mm subpleural nodule anterior right upper lobe ? CT abdomen/pelvis 05/20/2019-mildly dilated versus thickened appendix, small amount of nonspecific pelvic fluid, right ovarian cyst ? CT chest 12/09/2019-stable 2 mm subpleural anterior right upper lobe nodule, no new nodules, no evidence of metastatic disease, CT follow-up of this nodule not recommended ? CT abdomen/pelvis 05/24/2020-no evidence of recurrent or metastatic disease in the abdomen or pelvis 2. Transverse colon polyp noted on colonoscopy 03/13/2017, tubular adenoma             Colonoscopy 07/30/2018-polyp removed from the sigmoid colon-hyperplastic polyp-next colonoscopy 3-year interval   Disposition: Katie Reed remains in clinical remission from colon cancer.  Recent surveillance CT scans of abdomen/pelvis show no evidence of recurrent or metastatic disease.  Plan for the next surveillance chest CT in approximately 6 months.  She reports a history of chronic right low back pain, worse over the past 6 months.  We made a referral to orthopedics.  She will return for a CEA and chest CT in 6 months.  We will see her in follow-up a few days later to review the results.    [name] ANP/GNP-BC   05/31/2020  11:53 AM

## 2020-06-01 ENCOUNTER — Telehealth: Payer: Self-pay | Admitting: Oncology

## 2020-06-01 NOTE — Telephone Encounter (Signed)
Appointments scheduled calendar printed per 4/4 los/ Referral sent to [facility]

## 2020-06-04 ENCOUNTER — Telehealth: Payer: Self-pay | Admitting: *Deleted

## 2020-06-04 NOTE — Telephone Encounter (Signed)
Received fax from [facility] with her appointment on 06/16/20 at 0900/0915 with Cleta Alberts, PA. Left the information with their office number and address and will also sent via mychart.

## 2020-08-29 ENCOUNTER — Other Ambulatory Visit: Payer: Self-pay

## 2020-08-29 ENCOUNTER — Ambulatory Visit (INDEPENDENT_AMBULATORY_CARE_PROVIDER_SITE_OTHER): Payer: Medicaid Other

## 2020-08-29 ENCOUNTER — Ambulatory Visit (HOSPITAL_COMMUNITY)
Admission: EM | Admit: 2020-08-29 | Discharge: 2020-08-29 | Disposition: A | Discharge status: Home / Self Care | Payer: Medicaid Other | Attending: Physician Assistant | Admitting: Physician Assistant

## 2020-08-29 ENCOUNTER — Encounter (HOSPITAL_COMMUNITY): Payer: Self-pay | Admitting: *Deleted

## 2020-08-29 DIAGNOSIS — M5412 Radiculopathy, cervical region: Secondary | ICD-10-CM

## 2020-08-29 DIAGNOSIS — R200 Anesthesia of skin: Secondary | ICD-10-CM | POA: Diagnosis not present

## 2020-08-29 DIAGNOSIS — M542 Cervicalgia: Secondary | ICD-10-CM

## 2020-08-29 DIAGNOSIS — M79601 Pain in right arm: Secondary | ICD-10-CM

## 2020-08-29 MED ORDER — TIZANIDINE HCL 4 MG PO CAPS
4.0000 mg | ORAL_CAPSULE | Freq: Two times a day (BID) | ORAL | 0 refills | Status: DC | PRN
Start: 2020-08-29 — End: 2020-10-06

## 2020-08-29 MED ORDER — PREDNISONE 10 MG (21) PO TBPK: ORAL_TABLET | ORAL | 0 refills | Status: DC

## 2020-08-29 NOTE — ED Provider Notes (Signed)
[place]    CSN: [id number] Arrival date & time: 08/29/20  1451      History   Chief Complaint Chief Complaint  Patient presents with  . arm numbness    HPI Katie Reed is a 40 y.o. female.   Patient presents today with a several week history of intermittent right arm numbness and weakness.  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  For the past 24 hours she has had persistent symptoms involving her right neck into her right arm.  She describes this as a numbness/paresthesia sensation.  Reports she is able to move all major joints and denies any subjective weakness.  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  She denies any previous injury or neck surgery.  She reports pain is rated 5 on a 0-10 pain scale, localized to right arm without radiation, described as aching, worse with certain movements, no alleviating factors identified.  She has tried ibuprofen which provided temporary relief of symptoms.  She does have a history of colon cancer and received chemotherapy in 2019 but has not had any recent chemotherapy.  Denies history of diabetes or neurological condition.   Past Medical History:  Diagnosis Date  . Anxiety   . Colon cancer ([place]) dx'd 12/2016   oral chemo comp 2019  . Depression   . Preterm labor     Patient Active Problem List   Diagnosis Date Noted  . Colon cancer ([place]) 08/07/2017  . Colonic mass 07/25/2017  . Intussusception intestine ([place]) 02/19/2017  . Depression   . Anxiety   . Active labor 04/01/2015  . ASCUS with positive high risk HPV 03/23/2015  . Supervision of high-risk pregnancy 12/31/2014  . High risk pregnancy due to history of preterm labor 12/31/2014  . AMA (advanced maternal age) multigravida 35+ 12/31/2014  . Obesity in pregnancy 12/31/2014  . Tobacco smoking  affecting pregnancy, antepartum 12/31/2014  . Abnormal glucose tolerance test (GTT) during pregnancy, antepartum 12/31/2014    Past Surgical History:  Procedure Laterality Date  . FLEXIBLE SIGMOIDOSCOPY N/A 02/19/2017   Procedure: FLEXIBLE SIGMOIDOSCOPY;  Surgeon: Otis Brace, MD;  Location: [facility];  Service: Gastroenterology;  Laterality: N/A;  . NO PAST SURGERIES    . THERAPEUTIC ABORTION     elective    OB History     Gravida  6   Para  5   Term  4   Preterm  1   AB  1   Living  5      SAB  0   IAB  1   Ectopic  0   Multiple  0   Live Births  5            Home Medications    Prior to Admission medications   Medication Sig Start Date End Date Taking? Authorizing Provider  predniSONE (STERAPRED UNI-PAK 21 TAB) 10 MG (21) TBPK tablet As directed. 08/29/20  Yes Jadis Pitter K, PA-C  tiZANidine (ZANAFLEX) 4 MG capsule Take 1 capsule (4 mg total) by mouth 2 (two) times daily as needed for muscle spasms. 08/29/20  Yes Tiffany Talarico K, PA-C  Multiple Vitamin (MULTIVITAMIN) tablet Take 1 tablet by mouth daily. Concieve Plus supplement    [provider]    Family History Family History  Problem Relation Age of Onset  .  Hypertension Mother   . Colon cancer Mother   . Colon cancer Sister   . Alcohol abuse Neg Hx   . Arthritis Neg Hx   . Asthma Neg Hx   . Birth defects Neg Hx   . Cancer Neg Hx   . COPD Neg Hx   . Depression Neg Hx   . Diabetes Neg Hx   . Drug abuse Neg Hx   . Early death Neg Hx   . Hearing loss Neg Hx   . Heart disease Neg Hx   . Hyperlipidemia Neg Hx   . Kidney disease Neg Hx   . Learning disabilities Neg Hx   . Mental illness Neg Hx   . Mental retardation Neg Hx   . Miscarriages / Stillbirths Neg Hx   . Stroke Neg Hx   . Vision loss Neg Hx   . Varicose Veins Neg Hx     Social History Social History   Tobacco Use  . Smoking status: Some Days    Packs/day: 0.25    Years: 14.00    Pack years: 3.50     Types: Cigarettes  . Smokeless tobacco: Never  . Tobacco comments:    1-2 cigarettes a day  Vaping Use  . Vaping Use: Never used  Substance Use Topics  . Alcohol use: Yes    Alcohol/week: 1.0 standard drink    Types: 1 Glasses of wine per week    Comment: 1-2 times per week  . Drug use: No     Allergies   Patient has no known allergies.   Review of Systems Review of Systems  Constitutional:  Positive for activity change. Negative for appetite change, fatigue and fever.  Respiratory:  Negative for cough and shortness of breath.   Cardiovascular:  Negative for chest pain.  Gastrointestinal:  Negative for abdominal pain, diarrhea, nausea and vomiting.  Musculoskeletal:  Positive for neck pain. Negative for arthralgias and myalgias.  Neurological:  Positive for numbness (right arm). Negative for dizziness, weakness, light-headedness and headaches.    Physical Exam Triage Vital Signs ED Triage Vitals  Enc Vitals Group     BP 08/29/20 1534 106/66     Pulse Rate 08/29/20 1534 70     Resp 08/29/20 1534 18     Temp 08/29/20 1534 99.4 F (37.4 C)     Temp src --      SpO2 08/29/20 1534 99 %     Weight --      Height --      Head Circumference --      Peak Flow --      Pain Score 08/29/20 1536 0     Pain Loc --      Pain Edu? --      Excl. in [place]? --    No data found.  Updated Vital Signs BP 106/66   Pulse 70   Temp 99.4 F (37.4 C)   Resp 18   LMP 07/30/2020   SpO2 99%   Visual Acuity Right Eye Distance:   Left Eye Distance:   Bilateral Distance:    Right Eye Near:   Left Eye Near:    Bilateral Near:     Physical Exam Vitals reviewed.  Constitutional:      General: She is awake. She is not in acute distress.    Appearance: Normal appearance. She is normal weight. She is not ill-appearing.     Comments: Very pleasant female appears stated age in no acute distress  HENT:     Head: Normocephalic and atraumatic.  Cardiovascular:     Rate and Rhythm:  Normal rate and regular rhythm.     Pulses:          Radial pulses are 2+ on the right side and 2+ on the left side.     Heart sounds: Normal heart sounds, S1 normal and S2 normal. No murmur heard. Pulmonary:     Effort: Pulmonary effort is normal.     Breath sounds: Normal breath sounds. No wheezing, rhonchi or rales.     Comments: Clear to auscultation bilaterally Musculoskeletal:     Right shoulder: No swelling, tenderness or bony tenderness. Normal range of motion. Normal strength.     Cervical back: Spasms and tenderness present. No bony tenderness.     Thoracic back: No tenderness or bony tenderness.     Lumbar back: No tenderness or bony tenderness.     Comments: Neck/right arm: No pain percussion of vertebrae.  No bony tenderness or deformity on exam.  Strength 5/5 bilateral upper extremities.  Normal active range of motion at shoulder, wrist, fingers.  Hands neurovascularly intact.  Normal two-point discrimination.  Negative drop arm, empty can, Apley scratch.  Neurological:     General: No focal deficit present.     Cranial Nerves: Cranial nerves are intact.     Motor: Motor function is intact.     Coordination: Coordination is intact.     Comments: Cranial nerves II through XII intact.  No focal neurological defect on exam.  Psychiatric:        Behavior: Behavior is cooperative.     UC Treatments / Results  Labs (all labs ordered are listed, but only abnormal results are displayed) Labs Reviewed - No data to display  EKG   Radiology DG Cervical Spine Complete  Result Date: 08/29/2020 CLINICAL DATA:  Cervical neck pain.  Right arm numbness for 1 week. EXAM: CERVICAL SPINE - COMPLETE 4+ VIEW COMPARISON:  Radiograph 10/01/2016 FINDINGS: Mild broad-based rightward curvature which may be positional or muscle spasm. No listhesis. Vertebral body heights are preserved. The dens is partially obscured. Posterior elements appear well-aligned. There is no evidence of fracture. Trace  endplate spurring at D2-K0. Disc spaces are preserved. No bony neural foraminal narrowing. No prevertebral soft tissue edema. IMPRESSION: 1. Mild broad-based rightward curvature which may be positional or muscle spasm. 2. Trace endplate spurring at U5-K2. Electronically Signed   By: Keith Rake M.D.   On: 08/29/2020 16:46    Procedures Procedures (including critical care time)  Medications Ordered in UC Medications - No data to display  Initial Impression / Assessment and Plan / UC Course  I have reviewed the triage vital signs and the nursing notes.  Pertinent labs & imaging results that were available during my care of the patient were reviewed by me and considered in my medical decision making (see chart for details).    Physical exam is reassuring today with no focal neurological defect.  X-ray obtained showed evidence of muscle spasm with some degenerative changes but no acute findings.  Discussed that ultimately she will likely need further evaluation including MRI that this cannot be arranged through urgent care.  She does not have a PCP and will need to find someone.  We will try to find PCP through PCP assistance.  Patient reports that she is unable to take any time off work and it is imperative that she be given medication that she can work with.  She was given prednisone taper with instruction to take NSAIDs.  She was given Zanaflex with instruction to take this primarily at night and not to drive or drink alcohol with it.  We discussed alarm symptoms that warrant emergent evaluation to which patient expressed understanding.  Strict return precautions given.  Final Clinical Impressions(s) / UC Diagnoses   Final diagnoses:  Neck pain  Cervical radiculopathy  Right arm pain  Right arm numbness     Discharge Instructions      Your x-ray did show some degenerative changes which could be contributing to your symptoms but there is no obvious significant abnormality that explain  your symptoms.  Ultimately will likely need an MRI that is importantly follow-up with primary care provider.  Someone should reach out to you within a few weeks to schedule an appointment but if you do not hear from them please let us know and we will try to find a primary care provider for you.  Use prednisone taper as directed.  While you are taking is no ibuprofen, aspirin, Aleve as it can cause stomach bleeding.  You can take Tylenol for breakthrough pain as well as use icy hot and things you have on your skin.  Use tizanidine up to twice a day as needed.  This will make you sleepy do not drive or drink alcohol with it if you are going to work I recommend you only take it at night.  If you have any worsening symptoms including weakness, numbness, increased pain you need to go to the emergency room.     ED Prescriptions     Medication Sig Dispense Auth. Provider   predniSONE (STERAPRED UNI-PAK 21 TAB) 10 MG (21) TBPK tablet As directed. 21 tablet Cainan Trull K, PA-C   tiZANidine (ZANAFLEX) 4 MG capsule Take 1 capsule (4 mg total) by mouth 2 (two) times daily as needed for muscle spasms. 20 capsule Ricarda Atayde, Derry Skill, PA-C      PDMP not reviewed this encounter.   Terrilee Croak, PA-C 08/29/20 1656

## 2020-08-29 NOTE — ED Triage Notes (Signed)
Pt reports starting Friday her RT arm has felt numb. Pt is able to raise arm above head and moves all fingers. No injury to arm.

## 2020-08-29 NOTE — Discharge Instructions (Addendum)
Your x-ray did show some degenerative changes which could be contributing to your symptoms but there is no obvious significant abnormality that explain your symptoms.  Ultimately will likely need an MRI that is importantly follow-up with primary care provider.  Someone should reach out to you within a few weeks to schedule an appointment but if you do not hear from them please let us know and we will try to find a primary care provider for you.  Use prednisone taper as directed.  While you are taking is no ibuprofen, aspirin, Aleve as it can cause stomach bleeding.  You can take Tylenol for breakthrough pain as well as use icy hot and things you have on your skin.  Use tizanidine up to twice a day as needed.  This will make you sleepy do not drive or drink alcohol with it if you are going to work I recommend you only take it at night.  If you have any worsening symptoms including weakness, numbness, increased pain you need to go to the emergency room.

## 2020-09-01 ENCOUNTER — Telehealth (HOSPITAL_BASED_OUTPATIENT_CLINIC_OR_DEPARTMENT_OTHER): Payer: Self-pay

## 2020-09-01 NOTE — Telephone Encounter (Signed)
-----   Message from Curt Jews, RN sent at 09/01/2020 12:58 PM EDT ----- Regarding: Uc to PCP Patient needs to establish with PCP - routine

## 2020-10-06 NOTE — Patient Instructions (Addendum)
Recommendations from today's visit: Flu season is approaching. Current information suggests that the flu season this year may be particularly bad. Given this information, we suggest receiving the flu vaccine by the end of October to ensure that your body has time to respond to the vaccine.  Flu vaccines are available at most local pharmacy's and are covered by your insurance as preventative medication.  Our office has ordered flu vaccines and will have these available in September.  If you would like to have your flu vaccine here, please schedule a nurse visit for vaccination at your earliest convenience.  If you would like to see if you qualify for disability you can contact the office listed here to see if you qualify: Social Security Office Information Address: [place], [place] [address] Phone: [phone] Fax: [phone] Hours:  Monday 9:00 AM - 4:00 PM Tuesday 9:00 AM - 4:00 PM Wednesday 9:00 AM - 4:00 PM Thursday 9:00 AM - 4:00 PM Friday 9:00 AM - 4:00 PM Saturday Closed Sunday Closed  Information on diet, exercise, and health maintenance recommendations are listed below. This is information to help you be sure you are on track for optimal health and monitoring.   Please look over this and let us know if you have any questions or if you have completed any of the health maintenance outside of [place] so that we can be sure your records are up to date.  ___________________________________________________________  Thank you for choosing [place] at [facility] for your Primary Care needs. I am excited for the opportunity to partner with you to meet your health care goals. It was a pleasure meeting you today!  I am an Adult-Geriatric Nurse Practitioner with a background in caring for patients for more than 20 years. I provide primary care and sports medicine services to patients age 59 and older within this office. I am also the director  of the APP Fellowship with [facility].   I am passionate about providing the best service to you through preventive medicine and supportive care. I consider you a part of the medical team and value your input. I work diligently to ensure that you are heard and your needs are met in a safe and effective manner. I want you to feel comfortable with me as your provider and want you to know that your health concerns are important to me.  For your information, our office hours are Monday- Friday 8:00 AM - 5:00 PM At this time I am not in the office on Wednesdays.  If you have questions or concerns, please call our office at [phone] or send [place] a MyChart message and we will respond as quickly as possible.   For all urgent or time sensitive needs we ask that you please call the office to avoid delays. MyChart is not constantly monitored and replies may take up to 72 business hours.  MyChart Policy: MyChart allows for you to see your visit notes, after visit summary, provider recommendations, lab and tests results, make an appointment, request refills, and contact your provider or the office for non-urgent questions or concerns. Providers are seeing patients during normal business hours and do not have built in time to review MyChart messages.  We ask that you allow a minimum of 4 business days for responses to [employer]. For this reason, please do not send urgent requests through [place]. Please call the office at [phone]. Complex MyChart concerns may require a visit. Your provider may  request you schedule a virtual or in person visit to ensure we are providing the best care possible. MyChart messages sent after 4:00 PM on Friday will not be received by the provider until Monday morning.    Lab and Test Results: You will receive your lab and test results on MyChart as soon as they are completed and results have been sent by the lab or testing facility. Due to this service, you will receive  your results BEFORE your provider.  I review lab and tests results each morning prior to seeing patients. Some results require collaboration with other providers to ensure you are receiving the most appropriate care. For this reason, we ask that you please allow a minimum of 4 business days for your provider to receive and review lab and test results and contact you about these.  Most lab and test result comments from the provider will be sent through [place]. Your provider may recommend changes to the plan of care, follow-up visits, repeat testing, ask questions, or request an office visit to discuss these results. You may reply directly to this message or call the office at [phone] to provide information for the provider or set up an appointment. In some instances, you will be called with test results and recommendations. Please let us know if this is preferred and we will make note of this in your chart to provide this for you.    If you have not heard a response to your lab or test results in 72 business hours, please call the office to let us know.   After Hours: For all non-emergency after hours needs, please call the office at [phone] and select the option to reach the on-call provider service. On-call services are shared between multiple [place] offices and therefore it will not be possible to speak directly with your provider. On-call providers may provide medical advice and recommendations, but are unable to provide refills for maintenance medications.  For all emergency or urgent medical needs after normal business hours, we recommend that you seek care at the closest Urgent Care or Emergency Department to ensure appropriate treatment in a timely manner.  [facility] has a 24 hour emergency room located on the ground floor for your convenience.    Please do not hesitate to reach out to [place] with concerns.   Thank you, again, for choosing me as your health  care partner. I appreciate your trust and look forward to learning more about you.   [name], DNP, AGNP-c ___________________________________________________________  Health Maintenance Recommendations Screening Testing Mammogram Every 1 -2 years based on history and risk factors Starting at age 29 Pap Smear Ages 21-39 every 3 years Ages 43-65 every 5 years with HPV testing More frequent testing may be required based on results and history Colon Cancer Screening Every 1-10 years based on test performed, risk factors, and history Starting at age 64 Bone Density Screening Every 2-10 years based on history Starting at age 68 for women Recommendations for men differ based on medication usage, history, and risk factors AAA Screening One time ultrasound Men 23-75 years old who have every smoked Lung Cancer Screening Low Dose Lung CT every 12 months Age 33-80 years with a 30 pack-year smoking history who still smoke or who have quit within the last 15 years  Screening Labs Routine  Labs: Complete Blood Count (CBC), Complete Metabolic Panel (CMP), Cholesterol (Lipid Panel) Every 6-12 months based on history and medications May be recommended more  frequently based on current conditions or previous results Hemoglobin A1c Lab Every 3-12 months based on history and previous results Starting at age 16 or earlier with diagnosis of diabetes, high cholesterol, BMI >26, and/or risk factors Frequent monitoring for patients with diabetes to ensure blood sugar control Thyroid Panel (TSH w/ T3 & T4) Every 6 months based on history, symptoms, and risk factors May be repeated more often if on medication HIV One time testing for all patients 91 and older May be repeated more frequently for patients with increased risk factors or exposure Hepatitis C One time testing for all patients 97 and older May be repeated more frequently for patients with increased risk factors or  exposure Gonorrhea, Chlamydia Every 12 months for all sexually active persons 13-24 years Additional monitoring may be recommended for those who are considered high risk or who have symptoms PSA Men 20-27 years old with risk factors Additional screening may be recommended from age 26-69 based on risk factors, symptoms, and history  Vaccine Recommendations Tetanus Booster All adults every 10 years Flu Vaccine All patients 6 months and older every year COVID Vaccine All patients 12 years and older Initial dosing with booster May recommend additional booster based on age and health history HPV Vaccine 2 doses all patients age 87-26 Dosing may be considered for patients over 26 Shingles Vaccine (Shingrix) 2 doses all adults 38 years and older Pneumonia (Pneumovax 23) All adults 72 years and older May recommend earlier dosing based on health history Pneumonia (Prevnar 35) All adults 85 years and older Dosed 1 year after Pneumovax 23  Additional Screening, Testing, and Vaccinations may be recommended on an individualized basis based on family history, health history, risk factors, and/or exposure.  __________________________________________________________  Diet Recommendations for All Patients  I recommend that all patients maintain a diet low in saturated fats, carbohydrates, and cholesterol. While this can be challenging at first, it is not impossible and small changes can make big differences.  Things to try: Decreasing the amount of soda, sweet tea, and/or juice to one or less per day and replace with water While water is always the first choice, if you do not like water you may consider adding a water additive without sugar to improve the taste other sugar free drinks Replace potatoes with a brightly colored vegetable at dinner Use healthy oils, such as canola oil or olive oil, instead of butter or hard margarine Limit your bread intake to two pieces or less a day Replace  regular pasta with low carb pasta options Bake, broil, or grill foods instead of frying Monitor portion sizes  Eat smaller, more frequent meals throughout the day instead of large meals  An important thing to remember is, if you love foods that are not great for your health, you don't have to give them up completely. Instead, allow these foods to be a reward when you have done well. Allowing yourself to still have special treats every once in a while is a nice way to tell yourself thank you for working hard to keep yourself healthy.   Also remember that every day is a new day. If you have a bad day and "fall off the wagon", you can still climb right back up and keep moving along on your journey!  We have resources available to help you!  Some websites that may be helpful include: www.http://carter.biz/  Www.VeryWellFit.com _____________________________________________________________  Activity Recommendations for All Patients  I recommend that all adults get at least 20 minutes  of moderate physical activity that elevates your heart rate at least 5 days out of the week.  Some examples include: Walking or jogging at a pace that allows you to carry on a conversation Cycling (stationary bike or outdoors) Water aerobics Yoga Weight lifting Dancing If physical limitations prevent you from putting stress on your joints, exercise in a pool or seated in a chair are excellent options.  Do determine your MAXIMUM heart rate for activity: YOUR AGE - 220 = MAX HeartRate   Remember! Do not push yourself too hard.  Start slowly and build up your pace, speed, weight, time in exercise, etc.  Allow your body to rest between exercise and get good sleep. You will need more water than normal when you are exerting yourself. Do not wait until you are thirsty to drink. Drink with a purpose of getting in at least 8, 8 ounce glasses of water a day plus more depending on how much you exercise and sweat.    If you  begin to develop dizziness, chest pain, abdominal pain, jaw pain, shortness of breath, headache, vision changes, lightheadedness, or other concerning symptoms, stop the activity and allow your body to rest. If your symptoms are severe, seek emergency evaluation immediately. If your symptoms are concerning, but not severe, please let us know so that we can recommend further evaluation.   ________________________________________________________________

## 2020-10-06 NOTE — Progress Notes (Signed)
Katie Keeler, DNP, AGNP-c Primary Care Services ______________________________________________________________________  HPI Katie Reed is a 40 y.o. female presenting to [facility] [place] today to establish care.   Patient Care Team: Patient, No Pcp Per (Inactive) as PCP - General (General Practice)  Health Maintenance  Topic Date Due   Pneumococcal Vaccination (1 - PCV) Never done   Tetanus Vaccine  Never done   COVID-19 Vaccine (3 - Moderna risk series) 08/06/2019   Flu Shot  11/26/2020*   Pap Smear  06/04/2022   Hepatitis C Screening: USPSTF Recommendation to screen - Ages 18-79 yo.  Completed   HIV Screening  Completed   HPV Vaccine  Aged Out  *Topic was postponed. The date shown is not the original due date.     Concerns today: RIGHT ARM NUMBNESS Onset sudden in May of this year No injury No pain Weakness, heaviness, and numbness present to entire right extremity No loss of ROM, but difficulty with movement due to "heaviness" of extremity "Feels like my arm is in a tight blood pressure cuff" Symptoms not always present, but are present more often than not Arm never cold, swollen, or painful Was seen in UC in [place] July when symptoms continued Has been taking Zanaflex, without much relief Tried steroids but caused significant GI distress and diarrhea and had to stop these No PT X-ray done in UC Hx of colon cancer  DEPRESSION Recent increase in life stressors including separation from husband, inability to obtain and maintain a job due to arm numbness, limited to no social support, small children in the home- daughter (66) recently diagnosed with epilepsy, financial stressors and fear of losing housing.  She expresses significant concerns over the welfare of her children and her current housing situation. Has recently stopped drinking alcohol and smoking. Did not have an excessive alcohol history.    Patient  Active Problem List   Diagnosis Date Noted   Encounter to establish care with new doctor 10/07/2020   Right arm numbness 10/07/2020   Weakness of right arm 10/07/2020   Psychophysiological insomnia 10/07/2020   Bone spur 10/07/2020   Muscle spasms of neck 10/07/2020   Colon cancer ([place]) 08/07/2017   Colonic mass 07/25/2017   Intussusception intestine ([place]) 02/19/2017   Anxiety and depression    Anxiety    ASCUS with positive high risk HPV 03/23/2015    PHQ9 Today: Depression screen [facility] 2/9 10/07/2020 01/02/2020 05/01/2017  Decreased Interest 2 1 -  Down, Depressed, Hopeless '1 1 2  '$ PHQ - 2 Score '3 2 2  '$ Altered sleeping 2 2 0  Tired, decreased energy '2 3 2  '$ Change in appetite '2 2 2  '$ Feeling bad or failure about yourself  1 0 1  Trouble concentrating 1 0 0  Moving slowly or fidgety/restless 0 2 0  Suicidal thoughts 0 0 0  PHQ-9 Score '11 11 7  '$ Difficult doing work/chores Extremely dIfficult - -   GAD7 Today: GAD 7 : Generalized Anxiety Score 10/07/2020 01/02/2020 05/01/2017  Nervous, Anxious, on Edge 2 - 0  Control/stop worrying 3 2 0  Worry too much - different things '1 1 2  '$ Trouble relaxing '3 3 2  '$ Restless '2 3 2  '$ Easily annoyed or irritable 2 - 1  Afraid - awful might happen '1 1 3  '$ Total GAD 7 Score 14 - 10  Anxiety Difficulty Extremely difficult - -   ______________________________________________________________________ PMH Past Medical History:  Diagnosis Date  Abnormal glucose tolerance test (GTT) during pregnancy, antepartum 12/31/2014   Vicent Febles 1 hour = 135, passed 3 hr; Third trim= 41 Proven to 9#8oz   Active labor 04/01/2015   AMA (advanced maternal age) multigravida 35+ 12/31/2014   Anxiety    Colon cancer ([place]) dx'd 12/2016   oral chemo comp 2019   Depression    High risk pregnancy due to history of preterm labor 12/31/2014   Delivered at 35 weeks   Obesity in pregnancy 12/31/2014   Preterm labor    Supervision of high-risk pregnancy 12/31/2014    Clinic   High  Risk - transfer from [place]  Dating  by LMP Blood type: A/Positive/-- (10/20 0000)   Genetic Screen 1 Screen: too late   AFP: too late    Quad:  Too late    NIPS: Antibody:Negative (10/20 0000)  Anatomic US  wnl Rubella: Immune (10/20 0000)  GTT Sy[facility]ana [place]:   135  3 hour = 79-150-125-129 (passed)      Third trimester: 85  RPR:     Flu vaccine  declined HBsAg:   ne   Tobacco smoking affecting pregnancy, antepartum 12/31/2014    ROS All review of systems negative except what is listed in the HPI  PHYSICAL EXAM Physical Exam Vitals and nursing note reviewed.  Constitutional:      General: She is not in acute distress.    Appearance: Normal appearance.  HENT:     Head: Normocephalic and atraumatic.  Eyes:     Extraocular Movements: Extraocular movements intact.     Conjunctiva/sclera: Conjunctivae normal.     Pupils: Pupils are equal, round, and reactive to light.  Neck:     Thyroid: No thyromegaly.  Cardiovascular:     Rate and Rhythm: Normal rate and regular rhythm.     Pulses: Normal pulses.     Heart sounds: Normal heart sounds.  Pulmonary:     Effort: Pulmonary effort is normal.  Musculoskeletal:     Right shoulder: Tenderness present. No swelling, deformity, bony tenderness or crepitus. Decreased range of motion. Decreased strength. Normal pulse.     Left shoulder: Normal.     Right upper arm: No swelling, edema, deformity, tenderness or bony tenderness.     Left upper arm: Normal.     Right elbow: Swelling present.     Left elbow: Normal.     Right forearm: Normal.     Left forearm: Normal.     Right wrist: Normal.     Left wrist: Normal.     Right hand: No swelling, deformity, lacerations, tenderness or bony tenderness. Decreased range of motion. Decreased strength of finger abduction, thumb/finger opposition and wrist extension. Decreased sensation of the ulnar distribution, median distribution and radial distribution. There is disruption of two-point  discrimination. Normal capillary refill. Normal pulse.     Left hand: Normal.     Cervical back: Tenderness present. Muscular tenderness present. Decreased range of motion.     Right lower leg: No edema.     Left lower leg: No edema.     Comments: Right shoulder decreased active ROM suspect due to lack of sensation. Passive ROM normal.   Lymphadenopathy:     Cervical: No cervical adenopathy.  Skin:    General: Skin is warm and dry.     Capillary Refill: Capillary refill takes less than 2 seconds.  Neurological:     General: No focal deficit present.     Mental Status: She is alert  and oriented to person, place, and time.     Cranial Nerves: Cranial nerves are intact.     Sensory: Sensory deficit present.     Motor: Weakness and abnormal muscle tone present.     Coordination: Coordination abnormal.     Gait: Gait is intact.     Deep Tendon Reflexes:     Reflex Scores:      Tricep reflexes are 1+ on the right side and 3+ on the left side.      Bicep reflexes are 1+ on the right side and 3+ on the left side.      Brachioradialis reflexes are 1+ on the right side and 3+ on the left side. Psychiatric:        Attention and Perception: Attention and perception normal.        Mood and Affect: Mood is depressed. Affect is tearful.        Speech: Speech normal.        Behavior: Behavior normal.        Thought Content: Thought content normal.        Cognition and Memory: Cognition and memory normal.        Judgment: Judgment normal.   ______________________________________________________________________ ASSESSMENT AND PLAN Problem List Items Addressed This Visit     Anxiety and depression    Positive GAD7 and PHQ9 today with tearfulness throughout interview.  No SI/HI or alarm symptoms present today Significant life stressors in last several months with separation from spouse, raising multiple children in the household alone, difficulty finding and maintaining work due to  weakness/numbness in right arm, financial difficulties and fear of losing housing, and very limited social support.  Recommend starting on sertraline for mood, hydroxyzine for sleep, and counseling for support.  Discussed community resources that may be helpful for her until she is able to get stabilized.  F/U in 4 weeks or sooner if needed.       Relevant Medications   hydrOXYzine (ATARAX/VISTARIL) 50 MG tablet   sertraline (ZOLOFT) 50 MG tablet   Other Relevant Orders   Ambulatory referral to Psychology   Encounter to establish care with new doctor - Primary    Review of current and past medical history, social history, medication, and family history.  Review of care gaps and health maintenance recommendations.  Records from recent providers to be requested if not available in Chart Review or Care Everywhere.  Recommendations for health maintenance, diet, and exercise provided.        Right arm numbness    Numbness and weakness without pain in right arm since May of this year Blood flow intact No injury with sudden onset X-ray from UC reviewed with no distinct etiology, however spurring of C5-C6 is present Failed oral steroids and muscle relaxers for relief Hx of colon cancer Patient unable to maintain work due to limited use of arm, which is exacerbating depression and anxiety.  Recommend MRI of cervical spine for further evaluation.       Relevant Medications   tiZANidine (ZANAFLEX) 4 MG tablet   Other Relevant Orders   MR Cervical Spine Wo Contrast   Weakness of right arm    Review of note and imaging from UC Significant weakness present with loss of sensation on the distal forearm and hand.  Tenderness present to scapular region on left side Muscle spasms present in left cervical and scapular areas Pulses intact, skin warm and dry, capillary refill normal Suspect nerve impingement or myelopathy due to  presence of bone spurs and intermittent symptoms Unable to tolerate  steroids due to severe diarrhea Hx of colon cancer raises concern for possibility of metastasis, although unlikely.  Recommend MRI of neck for further evaluation of symptoms Will determine f/u after imaging reviewed      Relevant Orders   MR Cervical Spine Wo Contrast   Psychophysiological insomnia    Difficulty falling asleep related to numbness in arm and increased depression and anxiety symptoms related to life events. No SI/HI present today Recent separation from husband, raising multiple children in the household on her own, unable to work due to numbness and weakness in right arm, significant financial concerns present.  At this time recommend hydroxyzine to help her rest Indicates that she has quit drinking and does not wish to start back, although this has helped her rest in the past. Encouraged patient to avoid all alcohol and utilize hydroxyzine as directed. Plan to f/u in 4 weeks.       Relevant Medications   hydrOXYzine (ATARAX/VISTARIL) 50 MG tablet   sertraline (ZOLOFT) 50 MG tablet   Bone spur    Bone spurring to C5-C6 detected on x-ray Neurological symptoms present in right arm concerning for nerve impingement or myelopathy Recommend MRI for further evaluation given severity of symptoms and length of time present.       Relevant Orders   MR Cervical Spine Wo Contrast   Muscle spasms of neck    Spasms present with right arm weakness and numbness. Concerns for possible cervical etiology given combination of symptoms X-rays from UC reviewed Given prolonged period of symptoms with no improvement and no etiology present on x-ray, recommend MRI of cervical spine for further evaluation Patient does have hx of colon cancer with treatment completed Unable to tolerate oral steroids F/U to be determined based on imaging study      Relevant Medications   tiZANidine (ZANAFLEX) 4 MG tablet   Other Relevant Orders   MR Cervical Spine Wo Contrast    Education provided  today during visit and on AVS for patient to review at home.  Diet and Exercise recommendations provided.  Current diagnoses and recommendations discussed. HM recommendations reviewed with recommendations.    Outpatient Encounter Medications as of 10/07/2020  Medication Sig   hydrOXYzine (ATARAX/VISTARIL) 50 MG tablet Take 1 tablet (50 mg total) by mouth at bedtime and may repeat dose one time if needed.   Multiple Vitamin (MULTIVITAMIN) tablet Take 1 tablet by mouth daily. Concieve Plus supplement   predniSONE (STERAPRED UNI-PAK 21 TAB) 10 MG (21) TBPK tablet As directed.   sertraline (ZOLOFT) 50 MG tablet Take one tablet ('50mg'$ ) by mouth daily at bedtime for 7 days then increase to 1.5 tabs ('75mg'$ ) at bedtime.   [DISCONTINUED] tiZANidine (ZANAFLEX) 4 MG tablet Take 4 mg by mouth 2 (two) times daily as needed.   tiZANidine (ZANAFLEX) 4 MG tablet Take 1 tablet (4 mg total) by mouth 2 (two) times daily as needed for muscle spasms.   [DISCONTINUED] tiZANidine (ZANAFLEX) 4 MG capsule Take 1 capsule (4 mg total) by mouth 2 (two) times daily as needed for muscle spasms.   No facility-administered encounter medications on file as of 10/07/2020.    Return in about 4 weeks (around 11/04/2020) for Mood- Virtual Visit .  Time: 60 minutes, >50% spent counseling, care coordination, chart review, and documentation.   Orma Render, DNP, AGNP-c

## 2020-10-07 ENCOUNTER — Other Ambulatory Visit: Payer: Self-pay

## 2020-10-07 ENCOUNTER — Encounter (HOSPITAL_BASED_OUTPATIENT_CLINIC_OR_DEPARTMENT_OTHER): Payer: Self-pay | Admitting: Nurse Practitioner

## 2020-10-07 ENCOUNTER — Ambulatory Visit (INDEPENDENT_AMBULATORY_CARE_PROVIDER_SITE_OTHER): Payer: Medicaid Other | Admitting: Nurse Practitioner

## 2020-10-07 VITALS — BP 102/72 | HR 71 | Ht 63.0 in | Wt 181.6 lb

## 2020-10-07 DIAGNOSIS — M62838 Other muscle spasm: Secondary | ICD-10-CM

## 2020-10-07 DIAGNOSIS — R29898 Other symptoms and signs involving the musculoskeletal system: Secondary | ICD-10-CM

## 2020-10-07 DIAGNOSIS — M779 Enthesopathy, unspecified: Secondary | ICD-10-CM | POA: Diagnosis not present

## 2020-10-07 DIAGNOSIS — Z0000 Encounter for general adult medical examination without abnormal findings: Secondary | ICD-10-CM | POA: Insufficient documentation

## 2020-10-07 DIAGNOSIS — Z7689 Persons encountering health services in other specified circumstances: Secondary | ICD-10-CM

## 2020-10-07 DIAGNOSIS — F5104 Psychophysiologic insomnia: Secondary | ICD-10-CM

## 2020-10-07 DIAGNOSIS — R200 Anesthesia of skin: Secondary | ICD-10-CM

## 2020-10-07 DIAGNOSIS — F419 Anxiety disorder, unspecified: Secondary | ICD-10-CM

## 2020-10-07 DIAGNOSIS — Z716 Tobacco abuse counseling: Secondary | ICD-10-CM

## 2020-10-07 DIAGNOSIS — F32A Depression, unspecified: Secondary | ICD-10-CM

## 2020-10-07 HISTORY — DX: Persons encountering health services in other specified circumstances: Z76.89

## 2020-10-07 HISTORY — DX: Anesthesia of skin: R20.0

## 2020-10-07 HISTORY — DX: Other symptoms and signs involving the musculoskeletal system: R29.898

## 2020-10-07 HISTORY — DX: Psychophysiologic insomnia: F51.04

## 2020-10-07 HISTORY — DX: Other muscle spasm: M62.838

## 2020-10-07 MED ORDER — TIZANIDINE HCL 4 MG PO TABS: 4.0000 mg | ORAL_TABLET | Freq: Two times a day (BID) | ORAL | 0 refills | Status: DC | PRN

## 2020-10-07 MED ORDER — HYDROXYZINE HCL 50 MG PO TABS: 50.0000 mg | ORAL_TABLET | Freq: Every evening | ORAL | 3 refills | Status: DC | PRN

## 2020-10-07 MED ORDER — SERTRALINE HCL 50 MG PO TABS: ORAL_TABLET | ORAL | 3 refills | Status: DC

## 2020-10-07 NOTE — Assessment & Plan Note (Signed)
Spasms present with right arm weakness and numbness. Concerns for possible cervical etiology given combination of symptoms X-rays from UC reviewed Given prolonged period of symptoms with no improvement and no etiology present on x-ray, recommend MRI of cervical spine for further evaluation Patient does have hx of colon cancer with treatment completed Unable to tolerate oral steroids F/U to be determined based on imaging study

## 2020-10-07 NOTE — Assessment & Plan Note (Signed)
Numbness and weakness without pain in right arm since May of this year Blood flow intact No injury with sudden onset X-ray from UC reviewed with no distinct etiology, however spurring of C5-C6 is present Failed oral steroids and muscle relaxers for relief Hx of colon cancer Patient unable to maintain work due to limited use of arm, which is exacerbating depression and anxiety.  Recommend MRI of cervical spine for further evaluation.

## 2020-10-07 NOTE — Assessment & Plan Note (Signed)
Review of current and past medical history, social history, medication, and family history.  Review of care gaps and health maintenance recommendations.  Records from recent providers to be requested if not available in Chart Review or Care Everywhere.  Recommendations for health maintenance, diet, and exercise provided.   

## 2020-10-07 NOTE — Assessment & Plan Note (Signed)
Bone spurring to C5-C6 detected on x-ray Neurological symptoms present in right arm concerning for nerve impingement or myelopathy Recommend MRI for further evaluation given severity of symptoms and length of time present.

## 2020-10-07 NOTE — Assessment & Plan Note (Signed)
Review of note and imaging from UC Significant weakness present with loss of sensation on the distal forearm and hand.  Tenderness present to scapular region on left side Muscle spasms present in left cervical and scapular areas Pulses intact, skin warm and dry, capillary refill normal Suspect nerve impingement or myelopathy due to presence of bone spurs and intermittent symptoms Unable to tolerate steroids due to severe diarrhea Hx of colon cancer raises concern for possibility of metastasis, although unlikely.  Recommend MRI of neck for further evaluation of symptoms Will determine f/u after imaging reviewed

## 2020-10-07 NOTE — Assessment & Plan Note (Signed)
Difficulty falling asleep related to numbness in arm and increased depression and anxiety symptoms related to life events. No SI/HI present today Recent separation from husband, raising multiple children in the household on her own, unable to work due to numbness and weakness in right arm, significant financial concerns present.  At this time recommend hydroxyzine to help her rest Indicates that she has quit drinking and does not wish to start back, although this has helped her rest in the past. Encouraged patient to avoid all alcohol and utilize hydroxyzine as directed. Plan to f/u in 4 weeks.

## 2020-10-07 NOTE — Assessment & Plan Note (Signed)
Positive GAD7 and PHQ9 today with tearfulness throughout interview.  No SI/HI or alarm symptoms present today Significant life stressors in last several months with separation from spouse, raising multiple children in the household alone, difficulty finding and maintaining work due to weakness/numbness in right arm, financial difficulties and fear of losing housing, and very limited social support.  Recommend starting on sertraline for mood, hydroxyzine for sleep, and counseling for support.  Discussed community resources that may be helpful for her until she is able to get stabilized.  F/U in 4 weeks or sooner if needed.

## 2020-10-14 ENCOUNTER — Other Ambulatory Visit: Payer: Self-pay

## 2020-10-14 ENCOUNTER — Emergency Department (HOSPITAL_COMMUNITY)
Admission: EM | Admit: 2020-10-14 | Discharge: 2020-10-15 | Disposition: A | Discharge status: Home / Self Care | Payer: Medicaid Other | Attending: Emergency Medicine | Admitting: Emergency Medicine

## 2020-10-14 ENCOUNTER — Other Ambulatory Visit (HOSPITAL_BASED_OUTPATIENT_CLINIC_OR_DEPARTMENT_OTHER): Payer: Self-pay

## 2020-10-14 DIAGNOSIS — N898 Other specified noninflammatory disorders of vagina: Secondary | ICD-10-CM | POA: Insufficient documentation

## 2020-10-14 DIAGNOSIS — R109 Unspecified abdominal pain: Secondary | ICD-10-CM | POA: Insufficient documentation

## 2020-10-14 DIAGNOSIS — Z87891 Personal history of nicotine dependence: Secondary | ICD-10-CM | POA: Diagnosis not present

## 2020-10-14 DIAGNOSIS — Z85038 Personal history of other malignant neoplasm of large intestine: Secondary | ICD-10-CM | POA: Diagnosis not present

## 2020-10-14 DIAGNOSIS — R197 Diarrhea, unspecified: Secondary | ICD-10-CM | POA: Insufficient documentation

## 2020-10-14 LAB — CBC WITH DIFFERENTIAL/PLATELET
Abs Immature Granulocytes: 0.02 10*3/uL (ref 0.00–0.07)
Basophils Absolute: 0 10*3/uL (ref 0.0–0.1)
Basophils Relative: 1 %
Eosinophils Absolute: 0.1 10*3/uL (ref 0.0–0.5)
Eosinophils Relative: 1 %
HCT: 35.8 % — ABNORMAL LOW (ref 36.0–46.0)
Hemoglobin: 11.9 g/dL — ABNORMAL LOW (ref 12.0–15.0)
Immature Granulocytes: 0 %
Lymphocytes Relative: 48 %
Lymphs Abs: 2.8 10*3/uL (ref 0.7–4.0)
MCH: 30.5 pg (ref 26.0–34.0)
MCHC: 33.2 g/dL (ref 30.0–36.0)
MCV: 91.8 fL (ref 80.0–100.0)
Monocytes Absolute: 0.3 10*3/uL (ref 0.1–1.0)
Monocytes Relative: 6 %
Neutro Abs: 2.6 10*3/uL (ref 1.7–7.7)
Neutrophils Relative %: 44 %
Platelets: 300 10*3/uL (ref 150–400)
RBC: 3.9 MIL/uL (ref 3.87–5.11)
RDW: 13.2 % (ref 11.5–15.5)
WBC: 5.8 10*3/uL (ref 4.0–10.5)
nRBC: 0 % (ref 0.0–0.2)

## 2020-10-14 LAB — COMPREHENSIVE METABOLIC PANEL
ALT: 27 U/L (ref 0–44)
AST: 17 U/L (ref 15–41)
Albumin: 4.1 g/dL (ref 3.5–5.0)
Alkaline Phosphatase: 36 U/L — ABNORMAL LOW (ref 38–126)
Anion gap: 5 (ref 5–15)
BUN: 12 mg/dL (ref 6–20)
CO2: 27 mmol/L (ref 22–32)
Calcium: 9.4 mg/dL (ref 8.9–10.3)
Chloride: 105 mmol/L (ref 98–111)
Creatinine, Ser: 0.83 mg/dL (ref 0.44–1.00)
GFR, Estimated: >60 mL/min (ref >60)
Glucose, Bld: 103 mg/dL — ABNORMAL HIGH (ref 70–99)
Potassium: 4 mmol/L (ref 3.5–5.1)
Sodium: 137 mmol/L (ref 135–145)
Total Bilirubin: 0.3 mg/dL (ref 0.3–1.2)
Total Protein: 7 g/dL (ref 6.5–8.1)

## 2020-10-14 LAB — I-STAT BETA HCG BLOOD, ED (MC, WL, AP ONLY): I-stat hCG, quantitative: <5 m[IU]/mL (ref <5)

## 2020-10-14 LAB — LIPASE, BLOOD: Lipase: 38 U/L (ref 11–51)

## 2020-10-14 MED ORDER — ONDANSETRON HCL 4 MG/2ML IJ SOLN
4.0000 mg | Freq: Once | INTRAMUSCULAR | Status: AC
  Administered 2020-10-15: 4 mg via INTRAVENOUS
  Filled 2020-10-14: qty 2

## 2020-10-14 MED ORDER — KETOROLAC TROMETHAMINE 30 MG/ML IJ SOLN
30.0000 mg | Freq: Once | INTRAMUSCULAR | Status: AC
  Administered 2020-10-15: 30 mg via INTRAVENOUS
  Filled 2020-10-14: qty 1

## 2020-10-14 MED ORDER — SODIUM CHLORIDE 0.9 % IV BOLUS
1000.0000 mL | Freq: Once | INTRAVENOUS | Status: AC
  Administered 2020-10-15: 1000 mL via INTRAVENOUS

## 2020-10-14 NOTE — ED Provider Notes (Signed)
[place] EMERGENCY DEPARTMENT Provider Note   CSN: [id number] Arrival date & time: 10/14/20  1644     History Chief Complaint  Patient presents with   Diarrhea   Abdominal Pain    Katie Reed is a 40 y.o. female.  Patient is a 40 year old female with past medical history of colon cancer treated surgically and with oral chemo in 2019.  Patient presenting today for evaluation of abdominal pain and diarrhea.  This has been present for 2 weeks.  This seemed to begin after she was started on prednisone and a muscle relaxer for shoulder pain.  Symptoms have been worsening.  Diarrhea has been nonbloody and nonmelanotic.  She denies fevers or chills.  She denies vomiting.  She reports a 10 pound weight loss over this period of time.  The history is provided by the patient.  Diarrhea Quality:  Watery Severity:  Moderate Onset quality:  Gradual Duration:  2 weeks Timing:  Constant Progression:  Worsening Relieved by:  Nothing Worsened by:  Nothing Ineffective treatments:  None tried Associated symptoms: abdominal pain   Abdominal Pain Associated symptoms: diarrhea       Past Medical History:  Diagnosis Date   Abnormal glucose tolerance test (GTT) during pregnancy, antepartum 12/31/2014   Early 1 hour = 135, passed 3 hr; Third trim= 39 Proven to 9#8oz   Active labor 04/01/2015   AMA (advanced maternal age) multigravida 35+ 12/31/2014   Anxiety    Colon cancer ([place]) dx'd 12/2016   oral chemo comp 2019   Depression    High risk pregnancy due to history of preterm labor 12/31/2014   Delivered at 35 weeks   Obesity in pregnancy 12/31/2014   Preterm labor    Supervision of high-risk pregnancy 12/31/2014    Clinic   High Risk - transfer from [facility]  Dating  by LMP Blood type: A/Positive/-- (10/20 0000)   Genetic Screen 1 Screen: too late   AFP: too late    Quad:  Too late    NIPS: Antibody:Negative (10/20 0000)  Anatomic US  wnl Rubella:  Immune (10/20 0000)  GTT Early:   135  3 hour = 79-150-125-129 (passed)      Third trimester: 85  RPR:     Flu vaccine  declined HBsAg:   ne   Tobacco smoking affecting pregnancy, antepartum 12/31/2014    Patient Active Problem List   Diagnosis Date Noted   Encounter to establish care with new doctor 10/07/2020   Right arm numbness 10/07/2020   Weakness of right arm 10/07/2020   Psychophysiological insomnia 10/07/2020   Bone spur 10/07/2020   Muscle spasms of neck 10/07/2020   Colon cancer ([place]) 08/07/2017   Colonic mass 07/25/2017   Intussusception intestine ([place]) 02/19/2017   Anxiety and depression    Anxiety    ASCUS with positive high risk HPV 03/23/2015    Past Surgical History:  Procedure Laterality Date   FLEXIBLE SIGMOIDOSCOPY N/A 02/19/2017   Procedure: FLEXIBLE SIGMOIDOSCOPY;  Surgeon: Otis Brace, MD;  Location: [facility];  Service: Gastroenterology;  Laterality: N/A;   NO PAST SURGERIES     THERAPEUTIC ABORTION     elective     OB History     Gravida  6   Para  5   Term  4   Preterm  1   AB  1   Living  5      SAB  0   IAB  1  Ectopic  0   Multiple  0   Live Births  5           Family History  Problem Relation Age of Onset   Cancer Mother        Colon   Hypertension Mother    Colon cancer Mother    Colon cancer Sister    Alcohol abuse Neg Hx    Arthritis Neg Hx    Asthma Neg Hx    Birth defects Neg Hx    COPD Neg Hx    Depression Neg Hx    Diabetes Neg Hx    Drug abuse Neg Hx    Early death Neg Hx    Hearing loss Neg Hx    Heart disease Neg Hx    Hyperlipidemia Neg Hx    Kidney disease Neg Hx    Learning disabilities Neg Hx    Mental illness Neg Hx    Mental retardation Neg Hx    Miscarriages / Stillbirths Neg Hx    Stroke Neg Hx    Vision loss Neg Hx    Varicose Veins Neg Hx     Social History   Tobacco Use   Smoking status: Former    Packs/day: 0.25    Years: 14.00    Pack years: 3.50    Types:  Cigarettes   Smokeless tobacco: Never  Vaping Use   Vaping Use: Never used  Substance Use Topics   Alcohol use: Not Currently   Drug use: No    Home Medications Prior to Admission medications   Medication Sig Start Date End Date Taking? Authorizing Provider  hydrOXYzine (ATARAX/VISTARIL) 50 MG tablet Take 1 tablet (50 mg total) by mouth at bedtime and may repeat dose one time if needed. 10/07/20   Orma Render, NP  Multiple Vitamin (MULTIVITAMIN) tablet Take 1 tablet by mouth daily. Concieve Plus supplement    [provider]  predniSONE (STERAPRED UNI-PAK 21 TAB) 10 MG (21) TBPK tablet As directed. 08/29/20   Raspet, Derry Skill, PA-C  sertraline (ZOLOFT) 50 MG tablet Take one tablet ('50mg'$ ) by mouth daily at bedtime for 7 days then increase to 1.5 tabs ('75mg'$ ) at bedtime. 10/07/20   Orma Render, NP  tiZANidine (ZANAFLEX) 4 MG tablet Take 1 tablet (4 mg total) by mouth 2 (two) times daily as needed for muscle spasms. 10/07/20   Orma Render, NP    Allergies    Patient has no known allergies.  Review of Systems   Review of Systems  Gastrointestinal:  Positive for abdominal pain and diarrhea.  All other systems reviewed and are negative.  Physical Exam Updated Vital Signs BP 121/79 (BP Location: Right Arm)   Pulse 62   Temp 99 F (37.2 C)   Resp 18   SpO2 100%   Physical Exam Vitals and nursing note reviewed.  Constitutional:      General: She is not in acute distress.    Appearance: She is well-developed. She is not diaphoretic.  HENT:     Head: Normocephalic and atraumatic.  Cardiovascular:     Rate and Rhythm: Normal rate and regular rhythm.     Heart sounds: No murmur heard.   No friction rub. No gallop.  Pulmonary:     Effort: Pulmonary effort is normal. No respiratory distress.     Breath sounds: Normal breath sounds. No wheezing.  Abdominal:     General: Bowel sounds are normal. There is no distension.  Palpations: Abdomen is soft.     Tenderness: There  is abdominal tenderness in the epigastric area. There is no right CVA tenderness, left CVA tenderness, guarding or rebound.  Musculoskeletal:        General: Normal range of motion.     Cervical back: Normal range of motion and neck supple.  Skin:    General: Skin is warm and dry.  Neurological:     General: No focal deficit present.     Mental Status: She is alert and oriented to person, place, and time.    ED Results / Procedures / Treatments   Labs (all labs ordered are listed, but only abnormal results are displayed) Labs Reviewed  COMPREHENSIVE METABOLIC PANEL - Abnormal; Notable for the following components:      Result Value   Glucose, Bld 103 (*)    Alkaline Phosphatase 36 (*)    All other components within normal limits  CBC WITH DIFFERENTIAL/PLATELET - Abnormal; Notable for the following components:   Hemoglobin 11.9 (*)    HCT 35.8 (*)    All other components within normal limits  LIPASE, BLOOD  URINALYSIS, ROUTINE W REFLEX MICROSCOPIC  I-STAT BETA HCG BLOOD, ED ([facility], [facility], [facility] ONLY)    EKG None  Radiology No results found.  Procedures Procedures   Medications Ordered in ED Medications  sodium chloride 0.9 % bolus 1,000 mL (has no administration in time range)  ondansetron (ZOFRAN) injection 4 mg (has no administration in time range)  ketorolac (TORADOL) 30 MG/ML injection 30 mg (has no administration in time range)    ED Course  I have reviewed the triage vital signs and the nursing notes.  Pertinent labs & imaging results that were available during my care of the patient were reviewed by me and considered in my medical decision making (see chart for details).    MDM Rules/Calculators/A&P  Patient is a 40 year old female with medical history as per HPI presenting with complaints of diarrhea.  This has been persistent for 2 weeks.  She reports a 10 pound weight loss, but no bloody stools.  Patient arrives afebrile with stable vital signs.  Her physical  examination reveals generalized abdominal tenderness, but is most tender in the epigastric region.  Work-up shows unremarkable laboratory studies, clear urinalysis, and CT scan of the abdomen and pelvis that is unremarkable.  Due to the prolonged nature of her symptoms, I feel as though treatment with Flagyl is appropriate.  Patient to follow-up with primary doctor if not improving, and return to the ER if symptoms worsen or change.  Final Clinical Impression(s) / ED Diagnoses Final diagnoses:  None    Rx / DC Orders ED Discharge Orders     None        Veryl Speak, MD 10/15/20 [phone]

## 2020-10-14 NOTE — ED Triage Notes (Signed)
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 Denies n/v. Pain worse after eating and with bowel movement.s Denies sick contacts.

## 2020-10-14 NOTE — ED Provider Notes (Signed)
Emergency Medicine Provider Triage Evaluation Note  Katie Reed , a 40 y.o. female  was evaluated in triage.  Pt complains of diarrhea and periumbilical abdominal pain.  Patient has been dealing with the symptoms over the last 2 weeks.  Patient reports that symptoms started after she was started on pain medication, muscle relaxers, steroids for arthritis.  Patient reports that diarrhea comes anytime she eats any food.  Patient reports that periumbilical pain has been intermittent, pain is present with defecation or eating food.  No recent travel or antibiotic use.  Patient reports decreased food intake due to fear of diarrhea.  Patient denies any fevers, chills, nausea, vomiting, blood in stool, melena, dysuria, urinary frequency, pain with defecation.Marland Kitchen  LMP started this week.  Patient reports previous history of colon cancer.  Review of Systems  Positive: Diarrhea, abdominal pain Negative:  fevers, chills, nausea, vomiting, blood in stool, melena, dysuria, urinary frequency  Physical Exam  BP 105/70 (BP Location: Left Arm)   Pulse 62   Temp 99 F (37.2 C)   Resp 18   SpO2 100%  Gen:   Awake, no distress   Resp:  Normal effort  MSK:   Moves extremities without difficulty  Other:  Abdomen soft, nondistended, nontender, no guarding or rebound tenderness.  Medical Decision Making  Medically screening exam initiated at 5:49 PM.  Appropriate orders placed.  Rosali C Hottle was informed that the remainder of the evaluation will be completed by another provider, this initial triage assessment does not replace that evaluation, and the importance of remaining in the ED until their evaluation is complete.  The patient appears stable so that the remainder of the work up may be completed by another provider.      Loni Beckwith, PA-C [phone]    Lianne Cure, DO [id number]

## 2020-10-15 ENCOUNTER — Emergency Department (HOSPITAL_COMMUNITY): Payer: Medicaid Other

## 2020-10-15 LAB — URINALYSIS, ROUTINE W REFLEX MICROSCOPIC
Bilirubin Urine: NEGATIVE
Glucose, UA: NEGATIVE mg/dL
Hgb urine dipstick: NEGATIVE
Ketones, ur: NEGATIVE mg/dL
Leukocytes,Ua: NEGATIVE
Nitrite: NEGATIVE
Protein, ur: NEGATIVE mg/dL
Specific Gravity, Urine: 1.024 (ref 1.005–1.030)
pH: 5 (ref 5.0–8.0)

## 2020-10-15 MED ORDER — METRONIDAZOLE 500 MG PO TABS: 500.0000 mg | ORAL_TABLET | Freq: Two times a day (BID) | ORAL | 0 refills | Status: DC

## 2020-10-15 MED ORDER — IOHEXOL 350 MG/ML SOLN
80.0000 mL | Freq: Once | INTRAVENOUS | Status: AC | PRN
  Administered 2020-10-15: 80 mL via INTRAVENOUS

## 2020-10-15 NOTE — ED Notes (Signed)
Pharmacy at bedside to verify medications.  Patient will then be transported to CT.

## 2020-10-15 NOTE — Discharge Instructions (Addendum)
Begin taking Flagyl as prescribed.  Follow-up with primary doctor if symptoms are not improving in the next week, and return to the ER if you develop severe abdominal pain, bloody stools, high fevers, or other new and concerning symptoms.

## 2020-10-23 ENCOUNTER — Other Ambulatory Visit: Payer: Self-pay

## 2020-10-23 ENCOUNTER — Ambulatory Visit
Admission: RE | Admit: 2020-10-23 | Discharge: 2020-10-23 | Disposition: A | Discharge status: Home / Self Care | Payer: Medicaid Other | Source: Ambulatory Visit | Attending: Nurse Practitioner | Admitting: Nurse Practitioner

## 2020-10-23 DIAGNOSIS — R29898 Other symptoms and signs involving the musculoskeletal system: Secondary | ICD-10-CM

## 2020-10-23 DIAGNOSIS — M779 Enthesopathy, unspecified: Secondary | ICD-10-CM

## 2020-10-23 DIAGNOSIS — M62838 Other muscle spasm: Secondary | ICD-10-CM

## 2020-10-23 DIAGNOSIS — R200 Anesthesia of skin: Secondary | ICD-10-CM

## 2020-10-25 ENCOUNTER — Telehealth (HOSPITAL_BASED_OUTPATIENT_CLINIC_OR_DEPARTMENT_OTHER): Payer: Self-pay

## 2020-10-25 DIAGNOSIS — M5412 Radiculopathy, cervical region: Secondary | ICD-10-CM

## 2020-10-25 DIAGNOSIS — M5020 Other cervical disc displacement, unspecified cervical region: Secondary | ICD-10-CM

## 2020-10-25 NOTE — Telephone Encounter (Signed)
Called patient to discuss MRI results.  Patient is aware and agreeable with recommendations. Referral placed.

## 2020-10-25 NOTE — Progress Notes (Signed)
Please call patient:  MRI reviewed.  There appears to be small protrusions of the discs between the bones of the spine in 2 areas of the neck. This could be the cause of the symptoms you are experiencing.   I recommend that we send you to the spine specialist for evaluation and recommendations since you are having symptoms with this.   If patient is agreeable, OK to send referral for [facility]. 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

## 2020-10-25 NOTE — Telephone Encounter (Signed)
-----   Message from Orma Render, NP sent at 10/25/2020  8:06 AM EDT ----- Please call patient:  MRI reviewed.  There appears to be small protrusions of the discs between the bones of the spine in 2 areas of the neck. This could be the cause of the symptoms you are experiencing.   I recommend that we send you to the spine specialist for evaluation and recommendations since you are having symptoms with this.   If patient is agreeable, OK to send referral for [facility]. 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

## 2020-11-03 ENCOUNTER — Other Ambulatory Visit: Payer: Self-pay

## 2020-11-03 ENCOUNTER — Ambulatory Visit (HOSPITAL_COMMUNITY)
Admission: EM | Admit: 2020-11-03 | Discharge: 2020-11-03 | Disposition: A | Discharge status: Home / Self Care | Payer: Medicaid Other | Attending: Family Medicine | Admitting: Family Medicine

## 2020-11-03 ENCOUNTER — Encounter (HOSPITAL_COMMUNITY): Payer: Self-pay | Admitting: Emergency Medicine

## 2020-11-03 DIAGNOSIS — S39012A Strain of muscle, fascia and tendon of lower back, initial encounter: Secondary | ICD-10-CM | POA: Diagnosis not present

## 2020-11-03 DIAGNOSIS — R200 Anesthesia of skin: Secondary | ICD-10-CM | POA: Diagnosis not present

## 2020-11-03 DIAGNOSIS — M62838 Other muscle spasm: Secondary | ICD-10-CM | POA: Diagnosis not present

## 2020-11-03 MED ORDER — TIZANIDINE HCL 4 MG PO TABS: 4.0000 mg | ORAL_TABLET | Freq: Two times a day (BID) | ORAL | 0 refills | Status: DC | PRN

## 2020-11-03 MED ORDER — PREDNISONE 20 MG PO TABS: 40.0000 mg | ORAL_TABLET | Freq: Every day | ORAL | 0 refills | Status: DC

## 2020-11-03 NOTE — ED Provider Notes (Signed)
[facility]    CSN: [id number] Arrival date & time: 11/03/20  1654      History   Chief Complaint Chief Complaint  Patient presents with   Back Pain    HPI Katie Reed is a 40 y.o. female.   Patient presenting today with 21-monthhistory of bilateral low back pain and muscle spasms.  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  The past few days have been worse than typical and she states the only comfortable position is to be arching her back over a chair or sofa.  States the pain worsens with movement or lifting of objects.  Denies radiation of pain down legs, numbness, tingling, weakness, bowel or bladder incontinence, fevers, chills.  No traumatic injury to the area.  Muscle relaxers do seem to help.   Past Medical History:  Diagnosis Date   Abnormal glucose tolerance test (GTT) during pregnancy, antepartum 12/31/2014   Early 1 hour = 135, passed 3 hr; Third trim= 834Proven to 9#8oz   Active labor 04/01/2015   AMA (advanced maternal age) multigravida 35+ 12/31/2014   Anxiety    [place] dx[place] 12/[place]   oral chemo comp 2019   Depression    High risk pregnancy due to history of preterm labor 12/31/2014   Delivered at 35 weeks   Obesity in pregnancy 12/31/2014   Preterm labor    Supervision of high-risk pregnancy 12/31/2014    Clinic   [place]  by LMP Blood type: A/Positive/-- (10/20 0000)   Genetic Screen 1 Screen: too late   AFP: too late    Quad:  Too late    NIPS: Antibody:Negative (10/20 0000)  Anatomic [place] w[place] Rubella: Immune (10/20 0000)  GTT Early:   135  3 hour = 79-150-125-129 (passed)      Third trimester: 85  RPR:     Flu vaccine  declined HBsAg:   ne   Tobacco smoking affecting pregnancy, antepartum 12/31/2014    Patient Active Problem List   Diagnosis Date Noted   Encounter to establish care with new doctor 10/07/2020   Right arm numbness 10/07/2020   Weakness  of right arm 10/07/2020   Psychophysiological insomnia 10/07/2020   Bone spur 10/07/2020   Muscle spasms of neck 10/07/2020   [place]   Colonic mass 07/25/2017   [place] 02/19[place]   Anxiety and depression    Anxiety    ASCUS with positive high risk HPV 03/23/2015    Past Surgical History:  Procedure Laterality Date   FLEXIBLE SIGMOIDOSCOPY N/A 02/19/2017   Procedure: FLEXIBLE SIGMOIDOSCOPY;  Surgeon: BOtis Brace MD;  Location: [facility];  Service: Gastroenterology;  Laterality: N/A;   NO PAST SURGERIES     THERAPEUTIC ABORTION     elective    OB History     Gravida  6   Para  5   Term  4   Preterm  1   AB  1   Living  5      SAB  0   IAB  1   Ectopic  0   Multiple  0   Live Births  5            Home Medications    Prior to Admission medications   Medication Sig Start Date End Date Taking? Authorizing Provider  predniSONE (DELTASONE) 20 MG tablet Take 2 tablets (  40 mg total) by mouth daily with breakfast. 11/03/20  Yes Volney American, PA-C  hydrOXYzine (ATARAX/VISTARIL) 50 MG tablet Take 1 tablet (50 mg total) by mouth at bedtime and may repeat dose one time if needed. 10/07/20   Orma Render, NP  metroNIDAZOLE (FLAGYL) 500 MG tablet Take 1 tablet (500 mg total) by mouth 2 (two) times daily. One po bid x 7 days 10/15/20   Veryl Speak, MD  Multiple Vitamin (MULTIVITAMIN) tablet Take 1 tablet by mouth daily. Concieve Plus supplement    [provider]  predniSONE (STERAPRED UNI-PAK 21 TAB) 10 MG (21) TBPK tablet As directed. Patient not taking: No sig reported 08/29/20   Raspet, Erin K, PA-C  sertraline (ZOLOFT) 50 MG tablet Take one tablet ('50mg'$ ) by mouth daily at bedtime for 7 days then increase to 1.5 tabs ('75mg'$ ) at bedtime. 10/07/20   Orma Render, NP  tiZANidine (ZANAFLEX) 4 MG tablet Take 1 tablet (4 mg total) by mouth 2 (two) times daily as needed for muscle spasms. Do not drink  alcohol or drive while taking this medication.  May cause drowsiness. 11/03/20   Volney American, PA-C    Family History Family History  Problem Relation Age of Onset   Cancer Mother        Colon   Hypertension Mother    Colon cancer Mother    Colon cancer Sister    Alcohol abuse Neg Hx    Arthritis Neg Hx    Asthma Neg Hx    Birth defects Neg Hx    COPD Neg Hx    Depression Neg Hx    Diabetes Neg Hx    Drug abuse Neg Hx    Early death Neg Hx    Hearing loss Neg Hx    Heart disease Neg Hx    Hyperlipidemia Neg Hx    Kidney disease Neg Hx    Learning disabilities Neg Hx    Mental illness Neg Hx    Mental retardation Neg Hx    Miscarriages / Stillbirths Neg Hx    Stroke Neg Hx    Vision loss Neg Hx    Varicose Veins Neg Hx     Social History Social History   Tobacco Use   Smoking status: Former    Packs/day: 0.25    Years: 14.00    Pack years: 3.50    Types: Cigarettes   Smokeless tobacco: Never  Vaping Use   Vaping Use: Never used  Substance Use Topics   Alcohol use: Not Currently   Drug use: No     Allergies   Patient has no known allergies.   Review of Systems Review of Systems Per HPI  Physical Exam Triage Vital Signs ED Triage Vitals  Enc Vitals Group     BP 11/03/20 1804 115/66     Pulse Rate 11/03/20 1804 65     Resp 11/03/20 1804 17     Temp 11/03/20 1804 99 F (37.2 C)     Temp Source 11/03/20 1804 Oral     SpO2 11/03/20 1804 100 %     Weight --      Height --      Head Circumference --      Peak Flow --      Pain Score 11/03/20 1803 5     Pain Loc --      Pain Edu? --      Excl. in [place]? --    No data found.  Updated Vital Signs BP 115/66 (BP Location: Right Arm)   Pulse 65   Temp 99 F (37.2 C) (Oral)   Resp 17   LMP 10/02/2020   SpO2 100%   Visual Acuity Right Eye Distance:   Left Eye Distance:   Bilateral Distance:    Right Eye Near:   Left Eye Near:    Bilateral Near:     Physical Exam Vitals and  nursing note reviewed.  Constitutional:      Appearance: Normal appearance. She is not ill-appearing.  HENT:     Head: Atraumatic.     Mouth/Throat:     Mouth: Mucous membranes are moist.  Eyes:     Extraocular Movements: Extraocular movements intact.     Conjunctiva/sclera: Conjunctivae normal.  Cardiovascular:     Rate and Rhythm: Normal rate and regular rhythm.     Heart sounds: Normal heart sounds.  Pulmonary:     Effort: Pulmonary effort is normal.     Breath sounds: Normal breath sounds.  Abdominal:     General: Bowel sounds are normal. There is no distension.     Palpations: Abdomen is soft.     Tenderness: There is no abdominal tenderness. There is no guarding.  Musculoskeletal:        General: Tenderness present. No swelling, deformity or signs of injury. Normal range of motion.     Cervical back: Normal range of motion and neck supple.     Comments: No midline spinal tenderness palpation diffusely.  Bilateral lumbar lateral musculature tender to palpation and mild spasm.  Negative straight leg raise bilateral lower extremities.  Normal gait  Skin:    General: Skin is warm and dry.     Findings: No erythema.  Neurological:     Mental Status: She is alert and oriented to person, place, and time.     Comments: Bilateral lower extremities neurovascular intact  Psychiatric:        Mood and Affect: Mood normal.        Thought Content: Thought content normal.        Judgment: Judgment normal.     UC Treatments / Results  Labs (all labs ordered are listed, but only abnormal results are displayed) Labs Reviewed - No data to display  EKG   Radiology No results found.  Procedures Procedures (including critical care time)  Medications Ordered in UC Medications - No data to display  Initial Impression / Assessment and Plan / UC Course  I have reviewed the triage vital signs and the nursing notes.  Pertinent labs & imaging results that were available during my  care of the patient were reviewed by me and considered in my medical decision making (see chart for details).     Consistent with muscular strain of both areas.  We will treat with prednisone burst, Zanaflex, stretches, heat, rest.  Follow-up with sports medicine if worsening or not resolving.  Final Clinical Impressions(s) / UC Diagnoses   Final diagnoses:  Strain of lumbar region, initial encounter  Right arm numbness  Muscle spasms of neck   Discharge Instructions   None    ED Prescriptions     Medication Sig Dispense Auth. Provider   predniSONE (DELTASONE) 20 MG tablet Take 2 tablets (40 mg total) by mouth daily with breakfast. 10 tablet Volney American, PA-C   tiZANidine (ZANAFLEX) 4 MG tablet Take 1 tablet (4 mg total) by mouth 2 (two) times daily as needed for muscle spasms. Do not drink  alcohol or drive while taking this medication.  May cause drowsiness. 20 tablet Volney American, [place]      PDMP not reviewed this encounter.   Volney American, [place] 11/03/20 1840

## 2020-11-03 NOTE — ED Triage Notes (Signed)
Pt c/o back pain for 3 months. Reports worse today. Tried taking muscle relaxer which only helped a little. Denies injuries or falls. Reports lifting at work.

## 2020-11-08 ENCOUNTER — Ambulatory Visit (HOSPITAL_BASED_OUTPATIENT_CLINIC_OR_DEPARTMENT_OTHER): Payer: Medicaid Other | Admitting: Nurse Practitioner

## 2020-11-08 ENCOUNTER — Encounter (HOSPITAL_BASED_OUTPATIENT_CLINIC_OR_DEPARTMENT_OTHER): Payer: Self-pay | Admitting: Nurse Practitioner

## 2020-11-08 ENCOUNTER — Other Ambulatory Visit: Payer: Self-pay

## 2020-11-08 VITALS — BP 111/72 | HR 60 | Ht 63.0 in | Wt 179.6 lb

## 2020-11-08 DIAGNOSIS — M779 Enthesopathy, unspecified: Secondary | ICD-10-CM | POA: Diagnosis not present

## 2020-11-08 DIAGNOSIS — F411 Generalized anxiety disorder: Secondary | ICD-10-CM | POA: Diagnosis not present

## 2020-11-08 DIAGNOSIS — M5459 Other low back pain: Secondary | ICD-10-CM

## 2020-11-08 DIAGNOSIS — F332 Major depressive disorder, recurrent severe without psychotic features: Secondary | ICD-10-CM

## 2020-11-08 DIAGNOSIS — F331 Major depressive disorder, recurrent, moderate: Secondary | ICD-10-CM | POA: Insufficient documentation

## 2020-11-08 DIAGNOSIS — M5020 Other cervical disc displacement, unspecified cervical region: Secondary | ICD-10-CM | POA: Diagnosis not present

## 2020-11-08 DIAGNOSIS — F5104 Psychophysiologic insomnia: Secondary | ICD-10-CM

## 2020-11-08 DIAGNOSIS — M5136 Other intervertebral disc degeneration, lumbar region with discogenic back pain only: Secondary | ICD-10-CM | POA: Insufficient documentation

## 2020-11-08 MED ORDER — SERTRALINE HCL 100 MG PO TABS: 100.0000 mg | ORAL_TABLET | Freq: Every day | ORAL | 3 refills | Status: DC

## 2020-11-08 MED ORDER — PREDNISONE 10 MG PO TABS
ORAL_TABLET | Freq: Every day | ORAL | 0 refills | Status: DC
  Filled 2020-11-08: qty 48, 12d supply, fill #0

## 2020-11-08 MED ORDER — SERTRALINE HCL 100 MG PO TABS
100.0000 mg | ORAL_TABLET | Freq: Every day | ORAL | 3 refills | Status: DC
Start: 2020-11-08 — End: 2020-12-06
  Filled 2020-11-08: qty 30, 30d supply, fill #0

## 2020-11-08 NOTE — Progress Notes (Signed)
Established Patient Office Visit  Subjective:  Patient ID: Katie Reed, female    DOB: 03/23/1980  Age: 40 y.o. MRN: [id number]  CC:  Chief Complaint  Patient presents with   Follow-up    Patient was seen in urgent care on last Wednesday for back pain.  She was prescribed prednisone which she has not started taking yet.    HPI Katie Reed presents for encounter for follow up on mood and new medications.  At her last visit (10/07/2020) she reported increased life stressors and exacerbation of anxiety and depression symptoms. She was going through a separation with her husband, having difficulty finding employment, she had little social support and children at home, her daughter (49) was recently diagnosed with epilepsy, and financial concerns were a significant factor for her.   She was started on sertraline '50mg'$  qHS and hydroxyzine '50mg'$  PRN qHS for symptom management. She was also referred to psychology for counseling services.   Since her last visit she tells me she is not feeling much better. She reports she has been taking the sertraline '50mg'$  at bedtime, but has not taken the hydrozyzine as she "googled it and it said it was for pets".  She reports that she has not heard anything from counseling. She states that her mood is still poor and she is very depressed and anxious, but denies SI/HI. She has significant anxiety over her living situation- she is expecting a letter for eviction this week and has been unable to find help with resources until she actually has an eviction notice. She has received help with her utilities, which she says has helped.  She reports that her daughter's condition has improved since she was started on medication and this is 1 stressor that has been reduced.  She tells me that she was able to find work, but her back pain has made working difficult and a recent UC visit revealed buldging discs in the cervical and lumbar spine. She was put on a  steroid taper and given muscle relaxers.  She was unable to pick up the steroid taper due to cost. She reports that her back pain is still present however she feels that the muscle relaxer is helping.  Past Medical History:  Diagnosis Date   Abnormal glucose tolerance test (GTT) during pregnancy, antepartum 12/31/2014   [name] 1 hour = 135, passed 3 hr; Third trim= 85 Proven to 9#8oz   Active labor 04/01/2015   AMA (advanced maternal age) multigravida 35+ 12/31/2014   Anxiety    Anxiety    Anxiety and depression    Colon cancer ([place]) dx'd 12/2016   oral chemo comp 2019   Depression    High risk pregnancy due to history of preterm labor 12/31/2014   Delivered at 35 weeks   Obesity in pregnancy 12/31/2014   Preterm labor    Supervision of high-risk pregnancy 12/31/2014    Clinic   High Risk - transfer from [place]  Dating  by LMP Blood type: A/Positive/-- (10/20 0000)   Genetic Screen 1 Screen: too late   AFP: too late    Quad:  Too late    NIPS: Antibody:Negative (10/20 0000)  Anatomic US  wnl Rubella: Immune (10/20 0000)  GTT Katie Reed:   135  3 hour = 79-150-125-129 (passed)      Third trimester: 85  RPR:     Flu vaccine  declined HBsAg:   ne   Tobacco smoking affecting pregnancy, antepartum 12/31/2014  Past Surgical History:  Procedure Laterality Date   FLEXIBLE SIGMOIDOSCOPY N/A 02/19/2017   Procedure: FLEXIBLE SIGMOIDOSCOPY;  Surgeon: Otis Brace, MD;  Location: [facility];  Service: Gastroenterology;  Laterality: N/A;   NO PAST SURGERIES     THERAPEUTIC ABORTION     elective    Family History  Problem Relation Age of Onset   Cancer Mother        Colon   Hypertension Mother    Colon cancer Mother    Colon cancer Sister    Alcohol abuse Neg Hx    Arthritis Neg Hx    Asthma Neg Hx    Birth defects Neg Hx    COPD Neg Hx    Depression Neg Hx    Diabetes Neg Hx    Drug abuse Neg Hx    Caide Campi death Neg Hx    Hearing loss Neg Hx    Heart disease Neg  Hx    Hyperlipidemia Neg Hx    Kidney disease Neg Hx    Learning disabilities Neg Hx    Mental illness Neg Hx    Mental retardation Neg Hx    Miscarriages / Stillbirths Neg Hx    Stroke Neg Hx    Vision loss Neg Hx    Varicose Veins Neg Hx     Social History   Socioeconomic History   Marital status: Legally Separated    Spouse name: Not on file   Number of children: 4   Years of education: Not on file   Highest education level: Not on file  Occupational History   Occupation: stay at home mom  Tobacco Use   Smoking status: Former    Packs/day: 0.25    Years: 14.00    Pack years: 3.50    Types: Cigarettes   Smokeless tobacco: Never  Vaping Use   Vaping Use: Never used  Substance and Sexual Activity   Alcohol use: Not Currently   Drug use: No   Sexual activity: Not Currently    Birth control/protection: None  Other Topics Concern   Not on file  Social History Narrative   Currently separated   2 adult children, 2 younger children living at home, caring for one other family member   Social Determinants of Health   Financial Resource Strain: High Risk   Difficulty of Paying Living Expenses: Very hard  Food Insecurity: Landscape architect Present   Worried About Charity fundraiser in the Last Year: Often true   Arboriculturist in the Last Year: Often true  Transportation Needs: No Transportation Needs   Lack of Transportation (Medical): No   Lack of Transportation (Non-Medical): No  Physical Activity: Not on file  Stress: Stress Concern Present   Feeling of Stress : Very much  Social Connections: Not on file  Intimate Partner Violence: Not on file    Outpatient Medications Prior to Visit  Medication Sig Dispense Refill   hydrOXYzine (ATARAX/VISTARIL) 50 MG tablet Take 1 tablet (50 mg total) by mouth at bedtime and may repeat dose one time if needed. 60 tablet 3   Multiple Vitamin (MULTIVITAMIN) tablet Take 1 tablet by mouth daily. Concieve Plus supplement      tiZANidine (ZANAFLEX) 4 MG tablet Take 1 tablet (4 mg total) by mouth 2 (two) times daily as needed for muscle spasms. Do not drink alcohol or drive while taking this medication.  May cause drowsiness. 20 tablet 0   metroNIDAZOLE (FLAGYL) 500 MG tablet Take 1 tablet (  500 mg total) by mouth 2 (two) times daily. One po bid x 7 days 14 tablet 0   predniSONE (DELTASONE) 20 MG tablet Take 2 tablets (40 mg total) by mouth daily with breakfast. 10 tablet 0   predniSONE (STERAPRED UNI-PAK 21 TAB) 10 MG (21) TBPK tablet As directed. 21 tablet 0   sertraline (ZOLOFT) 50 MG tablet Take one tablet ('50mg'$ ) by mouth daily at bedtime for 7 days then increase to 1.5 tabs ('75mg'$ ) at bedtime. 45 tablet 3   No facility-administered medications prior to visit.    No Known Allergies  ROS Review of Systems See HPI   Objective:    Physical Exam Vitals and nursing note reviewed.  Constitutional:      Appearance: Normal appearance.  HENT:     Head: Normocephalic.  Eyes:     Extraocular Movements: Extraocular movements intact.     Conjunctiva/sclera: Conjunctivae normal.     Pupils: Pupils are equal, round, and reactive to light.  Cardiovascular:     Rate and Rhythm: Normal rate and regular rhythm.     Pulses: Normal pulses.  Pulmonary:     Effort: Pulmonary effort is normal.  Musculoskeletal:     Cervical back: Normal range of motion.  Neurological:     General: No focal deficit present.     Mental Status: She is alert and oriented to person, place, and time.  Psychiatric:        Attention and Perception: Attention normal.        Mood and Affect: Mood is depressed. Affect is blunt.        Speech: Speech normal.        Behavior: Behavior normal.        Thought Content: Thought content normal.        Cognition and Memory: Cognition normal.        Judgment: Judgment normal.    BP 111/72   Pulse 60   Ht '5\' 3"'$  (1.6 m)   Wt 179 lb 9.6 oz (81.5 kg)   SpO2 100%   BMI 31.81 kg/m  Wt Readings from  Last 3 Encounters:  11/08/20 179 lb 9.6 oz (81.5 kg)  10/07/20 181 lb 9.6 oz (82.4 kg)  05/31/20 193 lb 3.2 oz (87.6 kg)     Health Maintenance Due  Topic Date Due   Pneumococcal Vaccine 67-33 Years old (1 - PCV) Never done   TETANUS/TDAP  Never done   COVID-19 Vaccine (3 - Moderna risk series) 08/06/2019    There are no preventive care reminders to display for this patient.  No results found for: TSH Lab Results  Component Value Date   WBC 5.8 10/14/2020   HGB 11.9 (L) 10/14/2020   HCT 35.8 (L) 10/14/2020   MCV 91.8 10/14/2020   PLT 300 10/14/2020   Lab Results  Component Value Date   NA 137 10/14/2020   K 4.0 10/14/2020   CO2 27 10/14/2020   GLUCOSE 103 (H) 10/14/2020   BUN 12 10/14/2020   CREATININE 0.83 10/14/2020   BILITOT 0.3 10/14/2020   ALKPHOS 36 (L) 10/14/2020   AST 17 10/14/2020   ALT 27 10/14/2020   PROT 7.0 10/14/2020   ALBUMIN 4.1 10/14/2020   CALCIUM 9.4 10/14/2020   ANIONGAP 5 10/14/2020   No results found for: CHOL No results found for: HDL No results found for: LDLCALC No results found for: TRIG No results found for: CHOLHDL No results found for: HGBA1C    Assessment & Plan:  Problem List Items Addressed This Visit     Psychophysiological insomnia    Insomnia in the setting of severe depression and anxiety symptoms. Discussed with the patient the safety of hydroxyzine in humans and that many medications can be used in both humans and animals and that this was safe to use. Encourage patient not to take muscle relaxer for sleep but for musculoskeletal symptoms only as this can become habit forming. Follow-up in 4 weeks.      Moderate episode of recurrent major depressive disorder ([place]) - Primary    Symptoms consistent with major depressive disorder. Unfortunately she does have many life circumstances which are contributing to the symptoms and exacerbation of this problem. We will increase sertraline to 100 mg/day encouraged the patient  to begin hydroxyzine 50 mg at bedtime to help with sleep and anxiety symptoms. Also discussed options that may be available to her with DSS now that eviction notice has been received. Counseling referral placed. Follow-up in 4 weeks or sooner if needed.      Relevant Medications   sertraline (ZOLOFT) 100 MG tablet   Generalized anxiety disorder    Symptoms not well controlled with 50 mg sertraline-will increase dose to 100 mg at bedtime. Discussed with patient the use of hydroxyzine in both people and animals and explained that this is a common medication used in humans to help with sleep and anxiety and would be a safe option for her. New referral to counseling services placed today.  Patient instructed to contact the office if she has not heard anything within 7 business days. Unfortunately all resources that we have available have been tried by the patient for assistance with housing.  Since she now does have an eviction notice Department of Social Services should be able to step in and help. Recommend with the patient that she contact DSS again once the letter has been received and see if they are able to help with her housing needs. Follow-up in 4 weeks with virtual visit to monitor medication adjustment.       Relevant Medications   sertraline (ZOLOFT) 100 MG tablet   Other Relevant Orders   Ambulatory referral to Psychology   Discogenic lumbar pain    Recent visit to urgent care with MRI showing displacement of cervical and lumbar disks. She has been unable to pick up prednisone due to cost. [facility] for lower cost option. Recommend starting prednisone burst and exercises provided for at home use. If symptoms continue or fail to improve we will send for formal physical therapy however would like to avoid that at this time as this could be a significant stressor for her in the setting of additional stressors.      Relevant  Medications   predniSONE (DELTASONE) 10 MG tablet   Cervical disc displacement    Recent urgent care visit with MRI shows cervical disc displacement. She has not experiencing any cervical neck pain at this time however there are some paresthesias that she has expressed concern about in the past and her upper extremities. Resent the prednisone prescription to the [facility] pharmacy for more affordable option. Instructions provided for gentle at home exercises and stretches that may be beneficial. Recommend follow-up in 4 weeks or sooner if needed.      Relevant Medications   predniSONE (DELTASONE) 10 MG tablet   Bone spur   Relevant Medications   predniSONE (DELTASONE) 10 MG tablet  Meds ordered this encounter  Medications   DISCONTD: sertraline (ZOLOFT) 100 MG tablet    Sig: Take 1 tablet (100 mg total) by mouth at bedtime.    Dispense:  30 tablet    Refill:  3   sertraline (ZOLOFT) 100 MG tablet    Sig: Take 1 tablet (100 mg total) by mouth at bedtime.    Dispense:  30 tablet    Refill:  3   predniSONE (DELTASONE) 10 MG tablet    Sig: For the first three days, take 4 tablets every morning with breakfast, take 3 tablets for 3 days, take 2 tablets for 3 days then, take 1 tablet for 3 days    Dispense:  48 tablet    Refill:  0    Follow-up: Return in about 4 weeks (around 12/06/2020) for Mood- Virtual.    Orma Render, NP

## 2020-11-08 NOTE — Assessment & Plan Note (Signed)
Symptoms consistent with major depressive disorder. Unfortunately she does have many life circumstances which are contributing to the symptoms and exacerbation of this problem. We will increase sertraline to 100 mg/day encouraged the patient to begin hydroxyzine 50 mg at bedtime to help with sleep and anxiety symptoms. Also discussed options that may be available to her with DSS now that eviction notice has been received. Counseling referral placed. Follow-up in 4 weeks or sooner if needed.

## 2020-11-08 NOTE — Assessment & Plan Note (Signed)
Recent urgent care visit with MRI shows cervical disc displacement. She has not experiencing any cervical neck pain at this time however there are some paresthesias that she has expressed concern about in the past and her upper extremities. Resent the prednisone prescription to the [facility] for more affordable option. Instructions provided for gentle at home exercises and stretches that may be beneficial. Recommend follow-up in 4 weeks or sooner if needed.

## 2020-11-08 NOTE — Patient Instructions (Signed)
I recommend using the stretching exercises at least twice a day for your back. We can always look into formal PT if this is not helpful.  Use the backbrace while at work, but keep stretching and give breaks when you are not lifting.   I have resent the referral for counseling. If they have not contacted you in 7 business days, please call us.  I want you to increase your sertraline to '100mg'$  per day- for now take 2 tabs at bedtime until you run out of the prescription of medication you have then when you pick up the new prescription you will only take one tablet.   Take hydroxyzine as needed for anxiety and sleep.   Follow-up with video visit in 4 weeks.

## 2020-11-08 NOTE — Assessment & Plan Note (Signed)
Insomnia in the setting of severe depression and anxiety symptoms. Discussed with the patient the safety of hydroxyzine in humans and that many medications can be used in both humans and animals and that this was safe to use. Encourage patient not to take muscle relaxer for sleep but for musculoskeletal symptoms only as this can become habit forming. Follow-up in 4 weeks.

## 2020-11-08 NOTE — Assessment & Plan Note (Signed)
Symptoms not well controlled with 50 mg sertraline-will increase dose to 100 mg at bedtime. Discussed with patient the use of hydroxyzine in both people and animals and explained that this is a common medication used in humans to help with sleep and anxiety and would be a safe option for her. New referral to counseling services placed today.  Patient instructed to contact the office if she has not heard anything within 7 business days. Unfortunately all resources that we have available have been tried by the patient for assistance with housing.  Since she now does have an eviction notice Department of Social Services should be able to step in and help. Recommend with the patient that she contact DSS again once the letter has been received and see if they are able to help with her housing needs. Follow-up in 4 weeks with virtual visit to monitor medication adjustment.

## 2020-11-08 NOTE — Assessment & Plan Note (Signed)
Recent visit to urgent care with MRI showing displacement of cervical and lumbar disks. She has been unable to pick up prednisone due to cost. Will send medication [facility] for lower cost option. Recommend starting prednisone burst and exercises provided for at home use. If symptoms continue or fail to improve we will send for formal physical therapy however would like to avoid that at this time as this could be a significant stressor for her in the setting of additional stressors.

## 2020-11-30 ENCOUNTER — Inpatient Hospital Stay: Payer: Medicaid Other | Admitting: Oncology

## 2020-11-30 ENCOUNTER — Ambulatory Visit (HOSPITAL_BASED_OUTPATIENT_CLINIC_OR_DEPARTMENT_OTHER): Payer: Medicaid Other | Attending: Nurse Practitioner

## 2020-11-30 ENCOUNTER — Inpatient Hospital Stay: Payer: Medicaid Other

## 2020-12-06 ENCOUNTER — Other Ambulatory Visit: Payer: Self-pay

## 2020-12-06 ENCOUNTER — Telehealth (INDEPENDENT_AMBULATORY_CARE_PROVIDER_SITE_OTHER): Payer: Medicaid Other | Admitting: Nurse Practitioner

## 2020-12-06 VITALS — Ht 63.0 in | Wt 183.0 lb

## 2020-12-06 DIAGNOSIS — F5104 Psychophysiologic insomnia: Secondary | ICD-10-CM

## 2020-12-06 DIAGNOSIS — F411 Generalized anxiety disorder: Secondary | ICD-10-CM

## 2020-12-06 DIAGNOSIS — F332 Major depressive disorder, recurrent severe without psychotic features: Secondary | ICD-10-CM

## 2020-12-06 DIAGNOSIS — Z91199 Patient's noncompliance with other medical treatment and regimen due to unspecified reason: Secondary | ICD-10-CM

## 2020-12-06 MED ORDER — HYDROXYZINE HCL 50 MG PO TABS: 50.0000 mg | ORAL_TABLET | Freq: Every evening | ORAL | 3 refills | Status: DC | PRN

## 2020-12-06 MED ORDER — SERTRALINE HCL 100 MG PO TABS: 100.0000 mg | ORAL_TABLET | Freq: Every day | ORAL | 3 refills | Status: DC

## 2020-12-06 NOTE — Progress Notes (Signed)
Unable to get in touch with patient for visit.  Will work to reschedule.

## 2021-02-02 ENCOUNTER — Telehealth: Payer: Self-pay | Admitting: *Deleted

## 2021-02-02 NOTE — Telephone Encounter (Signed)
Patient agreed to have CT scan that was ordered for October 2022. Scheduled at 0845/0900 same day as there lab/OV. Patient aware.

## 2021-02-10 ENCOUNTER — Inpatient Hospital Stay (HOSPITAL_BASED_OUTPATIENT_CLINIC_OR_DEPARTMENT_OTHER): Payer: Medicaid Other | Admitting: Oncology

## 2021-02-10 ENCOUNTER — Ambulatory Visit (HOSPITAL_BASED_OUTPATIENT_CLINIC_OR_DEPARTMENT_OTHER)
Admission: RE | Admit: 2021-02-10 | Discharge: 2021-02-10 | Disposition: A | Discharge status: Home / Self Care | Payer: Medicaid Other | Source: Ambulatory Visit | Attending: Nurse Practitioner | Admitting: Nurse Practitioner

## 2021-02-10 ENCOUNTER — Inpatient Hospital Stay: Payer: Medicaid Other | Attending: Oncology

## 2021-02-10 ENCOUNTER — Other Ambulatory Visit: Payer: Self-pay

## 2021-02-10 ENCOUNTER — Encounter: Payer: Self-pay | Admitting: *Deleted

## 2021-02-10 VITALS — BP 118/63 | HR 87 | Temp 97.8°F | Resp 18 | Ht 63.0 in | Wt 186.2 lb

## 2021-02-10 DIAGNOSIS — M542 Cervicalgia: Secondary | ICD-10-CM | POA: Diagnosis not present

## 2021-02-10 DIAGNOSIS — Z85038 Personal history of other malignant neoplasm of large intestine: Secondary | ICD-10-CM | POA: Insufficient documentation

## 2021-02-10 DIAGNOSIS — C187 Malignant neoplasm of sigmoid colon: Secondary | ICD-10-CM

## 2021-02-10 DIAGNOSIS — Z9221 Personal history of antineoplastic chemotherapy: Secondary | ICD-10-CM | POA: Diagnosis not present

## 2021-02-10 DIAGNOSIS — R200 Anesthesia of skin: Secondary | ICD-10-CM | POA: Diagnosis not present

## 2021-02-10 LAB — CEA (ACCESS): CEA (CHCC): 2.96 ng/mL (ref 0.00–5.00)

## 2021-02-10 NOTE — Progress Notes (Signed)
[place] OFFICE PROGRESS NOTE   Diagnosis: Colon cancer  INTERVAL HISTORY:   Katie Reed returns as scheduled.  Good appetite and energy level.  No difficulty with bowel function.  No rectal bleeding. She reports neck and back pain for many months.  She has been evaluated by Judson Roch early.  She was prescribed a prednisone taper and tizanidine in September.  Her symptoms persist.  She now has numbness in the right arm in the evening. A cervical spine MRI 10/23/2020 revealed right paracentral disc protrusions at C3-4 and C5-6.  Objective:  Vital signs in last 24 hours:  Blood pressure 118/63, pulse 87, temperature 97.8 F (36.6 C), temperature source Oral, resp. rate 18, height _0  (1.6 m), weight 186 lb 3.2 oz (84.5 kg), SpO2 100 %.    Lymphatics: No cervical, supraclavicular, axillary, or inguinal nodes Resp: Lungs clear bilaterally Cardio: Regular rate and rhythm GI: No hepatosplenomegaly, no mass, nontender Vascular: No leg edema Neuro: The motor exam appears intact in the upper extremities bilaterally Skin skeletal: No tenderness at the neck or lower back  Portacath/PICC-without erythema  Lab Results:  Lab Results  Component Value Date   WBC 5.8 10/14/2020   HGB 11.9 (L) 10/14/2020   HCT 35.8 (L) 10/14/2020   MCV 91.8 10/14/2020   PLT 300 10/14/2020   NEUTROABS 2.6 10/14/2020    CMP  Lab Results  Component Value Date   NA 137 10/14/2020   K 4.0 10/14/2020   CL 105 10/14/2020   CO2 27 10/14/2020   GLUCOSE 103 (H) 10/14/2020   BUN 12 10/14/2020   CREATININE 0.83 10/14/2020   CALCIUM 9.4 10/14/2020   PROT 7.0 10/14/2020   ALBUMIN 4.1 10/14/2020   AST 17 10/14/2020   ALT 27 10/14/2020   ALKPHOS 36 (L) 10/14/2020   BILITOT 0.3 10/14/2020   GFRNONAA >60 10/14/2020   GFRAA >60 10/07/2019    Lab Results  Component Value Date   CEA1 2.01 05/24/2020   CEA 2.96 02/10/2021   Medications: I have reviewed the patient's current  medications.   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, status post a robotic assisted sigmoidectomy 07/25/2017, MSI-stable, no loss of mismatch repair protein expression 0/25 lymph nodes positive Tumor penetrated through the muscularis to within a cell of the serosal surface per personal discussion with the pathologist, therefore clinical impression is of an early T4 lesion Cycle 1 adjuvant Xeloda 08/13/2017 Cycle 2 adjuvant Xeloda 09/03/2017 Cycle 3  adjuvant Xeloda 09/24/2017 Cycle 4 adjuvant Xeloda 10/15/2017 Cycle 5 adjuvant Xeloda 11/04/2017 Cycle 6 adjuvant Xeloda 11/26/2017 Cycle 7 adjuvant Xeloda 12/17/2017  Cycle 8 adjuvant Xeloda 01/07/2018 CTs 07/25/2018- no metastatic disease; solitary tiny to minimally solid right upper lobe pulmonary nodule CT chest 12/04/2018-stable 2 mm subpleural nodule anterior right upper lobe CT abdomen/pelvis 05/20/2019-mildly dilated versus thickened appendix, small amount of nonspecific pelvic fluid, right ovarian cyst CT chest 12/09/2019-stable 2 mm subpleural anterior right upper lobe nodule, no new nodules, no evidence of metastatic disease, CT follow-up of this nodule not recommended CT abdomen/pelvis 05/24/2020-no evidence of recurrent or metastatic disease in the abdomen or pelvis Transverse colon polyp noted on colonoscopy 03/13/2017, tubular adenoma            Colonoscopy 07/30/2018-polyp removed from the sigmoid colon-hyperplastic polyp-next colonoscopy 3-year interval     Disposition: She remains in clinical remission from colon cancer.  We will follow-up on the chest CT from today.  She will return for an  office visit and CEA in 6 months.  I referred [name] to neurosurgery to evaluate the persistent neck pain and right arm numbness.  She will follow-up with Judson Roch early for medical therapy.  Betsy Coder, MD  02/10/2021  1:12 PM

## 2021-02-10 NOTE — Progress Notes (Signed)
Faxed referral order, demographics and chart information to [facility] for Dr. Vertell Limber, Trenton Gammon or Ronnald Ramp. Fax [phone]

## 2021-02-17 ENCOUNTER — Encounter: Payer: Self-pay | Admitting: General Practice

## 2021-03-24 ENCOUNTER — Other Ambulatory Visit: Payer: Self-pay

## 2021-03-24 ENCOUNTER — Other Ambulatory Visit (HOSPITAL_BASED_OUTPATIENT_CLINIC_OR_DEPARTMENT_OTHER): Payer: Self-pay | Admitting: Nurse Practitioner

## 2021-03-24 DIAGNOSIS — M5459 Other low back pain: Secondary | ICD-10-CM

## 2021-03-24 DIAGNOSIS — F411 Generalized anxiety disorder: Secondary | ICD-10-CM

## 2021-03-24 DIAGNOSIS — M779 Enthesopathy, unspecified: Secondary | ICD-10-CM

## 2021-03-24 DIAGNOSIS — F332 Major depressive disorder, recurrent severe without psychotic features: Secondary | ICD-10-CM

## 2021-03-24 DIAGNOSIS — M5020 Other cervical disc displacement, unspecified cervical region: Secondary | ICD-10-CM

## 2021-03-25 ENCOUNTER — Other Ambulatory Visit: Payer: Self-pay

## 2021-07-15 ENCOUNTER — Encounter: Payer: Self-pay | Admitting: Gastroenterology

## 2021-08-11 ENCOUNTER — Inpatient Hospital Stay: Payer: Medicaid Other | Admitting: Nurse Practitioner

## 2021-08-11 ENCOUNTER — Inpatient Hospital Stay: Payer: Medicaid Other

## 2021-08-14 ENCOUNTER — Ambulatory Visit (HOSPITAL_COMMUNITY): Payer: Self-pay

## 2021-08-15 ENCOUNTER — Inpatient Hospital Stay: Payer: Medicaid Other | Admitting: Nurse Practitioner

## 2021-08-15 ENCOUNTER — Inpatient Hospital Stay: Payer: Medicaid Other | Attending: Nurse Practitioner

## 2021-08-15 ENCOUNTER — Encounter: Payer: Self-pay | Admitting: *Deleted

## 2021-08-15 NOTE — Progress Notes (Signed)
"  No show" for lab/OV today. Scheduling message sent to reschedule for next month.

## 2021-08-19 ENCOUNTER — Other Ambulatory Visit (HOSPITAL_BASED_OUTPATIENT_CLINIC_OR_DEPARTMENT_OTHER): Payer: Self-pay | Admitting: Nurse Practitioner

## 2021-08-19 DIAGNOSIS — M779 Enthesopathy, unspecified: Secondary | ICD-10-CM

## 2021-08-19 DIAGNOSIS — M5459 Other low back pain: Secondary | ICD-10-CM

## 2021-08-19 DIAGNOSIS — M5020 Other cervical disc displacement, unspecified cervical region: Secondary | ICD-10-CM

## 2021-08-23 ENCOUNTER — Other Ambulatory Visit: Payer: Self-pay

## 2021-08-26 ENCOUNTER — Ambulatory Visit (HOSPITAL_BASED_OUTPATIENT_CLINIC_OR_DEPARTMENT_OTHER): Payer: Medicaid Other | Admitting: Nurse Practitioner

## 2021-08-31 ENCOUNTER — Other Ambulatory Visit (HOSPITAL_BASED_OUTPATIENT_CLINIC_OR_DEPARTMENT_OTHER): Payer: Self-pay | Admitting: Nurse Practitioner

## 2021-09-02 ENCOUNTER — Other Ambulatory Visit: Payer: Self-pay

## 2021-09-07 ENCOUNTER — Inpatient Hospital Stay (HOSPITAL_BASED_OUTPATIENT_CLINIC_OR_DEPARTMENT_OTHER): Payer: Medicaid Other | Admitting: Nurse Practitioner

## 2021-09-07 ENCOUNTER — Encounter: Payer: Self-pay | Admitting: Nurse Practitioner

## 2021-09-07 ENCOUNTER — Inpatient Hospital Stay: Payer: Medicaid Other | Attending: Nurse Practitioner

## 2021-09-07 VITALS — BP 105/72 | HR 69 | Temp 98.2°F | Resp 18 | Ht 63.0 in | Wt 187.0 lb

## 2021-09-07 DIAGNOSIS — R200 Anesthesia of skin: Secondary | ICD-10-CM | POA: Diagnosis not present

## 2021-09-07 DIAGNOSIS — Z85038 Personal history of other malignant neoplasm of large intestine: Secondary | ICD-10-CM | POA: Diagnosis present

## 2021-09-07 DIAGNOSIS — Z9221 Personal history of antineoplastic chemotherapy: Secondary | ICD-10-CM | POA: Insufficient documentation

## 2021-09-07 DIAGNOSIS — C187 Malignant neoplasm of sigmoid colon: Secondary | ICD-10-CM

## 2021-09-07 DIAGNOSIS — Z9049 Acquired absence of other specified parts of digestive tract: Secondary | ICD-10-CM | POA: Insufficient documentation

## 2021-09-07 LAB — CEA (ACCESS): CEA (CHCC): 4.26 ng/mL (ref 0.00–5.00)

## 2021-09-07 NOTE — Progress Notes (Signed)
  [facility] OFFICE PROGRESS NOTE   Diagnosis: Colon cancer  INTERVAL HISTORY:   Katie Reed returns for follow-up.  No change in bowel habits.  No bleeding with bowel movements.  She continues to have right arm numbness.  She was recently able to secure housing but does not have furniture.  Objective:  Vital signs in last 24 hours:  Blood pressure 105/72, pulse 69, temperature 98.2 F (36.8 C), temperature source Oral, resp. rate 18, height _0  (1.6 m), weight 187 lb (84.8 kg), SpO2 100 %.   Lymphatics: No palpable cervical, supraclavicular, axillary or inguinal lymph nodes. Resp: Lungs clear bilaterally. Cardio: Regular rate and rhythm. GI: Abdomen soft and nontender.  No hepatosplenomegaly. Vascular: No leg edema.  Lab Results:  Lab Results  Component Value Date   WBC 5.8 10/14/2020   HGB 11.9 (L) 10/14/2020   HCT 35.8 (L) 10/14/2020   MCV 91.8 10/14/2020   PLT 300 10/14/2020   NEUTROABS 2.6 10/14/2020    Imaging:  No results found.  Medications: I have reviewed the patient's current medications.  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, status post a robotic assisted sigmoidectomy 07/25/2017, MSI-stable, no loss of mismatch repair protein expression 0/25 lymph nodes positive Tumor penetrated through the muscularis to within a cell of the serosal surface per personal discussion with the pathologist, therefore clinical impression is of an early T4 lesion Cycle 1 adjuvant Xeloda 08/13/2017 Cycle 2 adjuvant Xeloda 09/03/2017 Cycle 3  adjuvant Xeloda 09/24/2017 Cycle 4 adjuvant Xeloda 10/15/2017 Cycle 5 adjuvant Xeloda 11/04/2017 Cycle 6 adjuvant Xeloda 11/26/2017 Cycle 7 adjuvant Xeloda 12/17/2017  Cycle 8 adjuvant Xeloda 01/07/2018 CTs 07/25/2018- no metastatic disease; solitary tiny to minimally solid right upper lobe pulmonary nodule CT chest 12/04/2018-stable 2 mm subpleural nodule anterior right  upper lobe CT abdomen/pelvis 05/20/2019-mildly dilated versus thickened appendix, small amount of nonspecific pelvic fluid, right ovarian cyst CT chest 12/09/2019-stable 2 mm subpleural anterior right upper lobe nodule, no new nodules, no evidence of metastatic disease, CT follow-up of this nodule not recommended CT abdomen/pelvis 05/24/2020-no evidence of recurrent or metastatic disease in the abdomen or pelvis CT chest 02/10/2021-no evidence of metastatic disease in the chest.  Stable tiny nodule anterior right upper lobe. Transverse colon polyp noted on colonoscopy 03/13/2017, tubular adenoma            Colonoscopy 07/30/2018-polyp removed from the sigmoid colon-hyperplastic polyp-next colonoscopy 3-year interval      Disposition: Katie Reed remains in clinical remission from colon cancer.  We will follow-up on the CEA from today.  Plan for surveillance CT scans prior to next office visit.  She is due for a colonoscopy.  Referral made to Dr. Ardis Hughs.  She will return for follow-up in approximately 6 months.  We are available to see her sooner if needed.  She was provided with information to contact the [place] worker regarding current housing situation.  She will follow-up with Katie Spring, NP regarding right arm numbness.    [name] ANP/GNP-BC   09/07/2021  2:57 PM

## 2021-09-08 ENCOUNTER — Telehealth: Payer: Self-pay | Admitting: Nurse Practitioner

## 2021-09-08 ENCOUNTER — Encounter: Payer: Self-pay | Admitting: *Deleted

## 2021-09-08 NOTE — Telephone Encounter (Signed)
Attempted to contact patient in regards to scheduling follow up per los 7/12. No answer so voicemail was left for patient to call back

## 2021-09-08 NOTE — Progress Notes (Signed)
Faxed referral to [facility] for Dr. Ardis Hughs. Patient is due colonoscopy. Also made CSW aware of referral order placed yesterday.

## 2021-09-08 NOTE — Progress Notes (Signed)
Katie Reed CSW Progress Note  Clinical Education officer, museum contacted patient by phone to assess needs per [name], NP, referral.  Patient stated she needed assistance with obtaining furniture and other resources.  She agreed to have CSW email her with the information.  Provided CSW and email information and encouraged her to contact CSW with any other needs.  She stated she had no other needs at this time.  Patient said she had three children living with her.  CSW gave her food pantry information, two resources for furniture at no clost, [employer] and [employer], as well as, contacts for clothing and household goods.    Katie Pickle Deeanne Deininger, LCSW    Patient is participating in a Managed Medicaid Plan:  Yes

## 2021-09-12 ENCOUNTER — Other Ambulatory Visit: Payer: Self-pay

## 2021-09-12 ENCOUNTER — Other Ambulatory Visit (HOSPITAL_BASED_OUTPATIENT_CLINIC_OR_DEPARTMENT_OTHER): Payer: Self-pay | Admitting: Nurse Practitioner

## 2021-09-12 DIAGNOSIS — R200 Anesthesia of skin: Secondary | ICD-10-CM

## 2021-09-12 DIAGNOSIS — M62838 Other muscle spasm: Secondary | ICD-10-CM

## 2021-09-12 MED ORDER — TIZANIDINE HCL 4 MG PO TABS: 4.0000 mg | ORAL_TABLET | Freq: Two times a day (BID) | ORAL | 0 refills | Status: DC | PRN

## 2021-09-13 ENCOUNTER — Other Ambulatory Visit: Payer: Medicaid Other | Admitting: Licensed Clinical Social Worker

## 2021-09-16 ENCOUNTER — Encounter (HOSPITAL_BASED_OUTPATIENT_CLINIC_OR_DEPARTMENT_OTHER): Payer: Self-pay | Admitting: Nurse Practitioner

## 2021-10-21 ENCOUNTER — Other Ambulatory Visit (HOSPITAL_BASED_OUTPATIENT_CLINIC_OR_DEPARTMENT_OTHER): Payer: Self-pay | Admitting: Nurse Practitioner

## 2021-10-21 DIAGNOSIS — F411 Generalized anxiety disorder: Secondary | ICD-10-CM

## 2021-10-21 DIAGNOSIS — F332 Major depressive disorder, recurrent severe without psychotic features: Secondary | ICD-10-CM

## 2021-10-21 MED ORDER — SERTRALINE HCL 100 MG PO TABS: 100.0000 mg | ORAL_TABLET | Freq: Every day | ORAL | 3 refills | Status: DC

## 2021-10-21 NOTE — Progress Notes (Signed)
Notification from [facility] received requesting 90 day script for sertraline '100mg'$  at patient request. #90 with Refills sent to pharmacy on file

## 2021-10-26 ENCOUNTER — Telehealth (HOSPITAL_BASED_OUTPATIENT_CLINIC_OR_DEPARTMENT_OTHER): Payer: Self-pay

## 2021-10-26 NOTE — Telephone Encounter (Signed)
Received fax from  [facility] for 90 day supply Sertealine '100mg'$ . Laretta Bolster signed and I faxed it back to [phone].

## 2021-11-08 ENCOUNTER — Encounter: Payer: Self-pay | Admitting: Gastroenterology

## 2021-11-18 ENCOUNTER — Ambulatory Visit (AMBULATORY_SURGERY_CENTER): Payer: Self-pay

## 2021-11-18 VITALS — Ht 63.0 in | Wt 185.0 lb

## 2021-11-18 DIAGNOSIS — Z85038 Personal history of other malignant neoplasm of large intestine: Secondary | ICD-10-CM

## 2021-11-18 MED ORDER — PEG 3350-KCL-NA BICARB-NACL 420 G PO SOLR: 4000.0000 mL | Freq: Once | ORAL | 0 refills | Status: AC

## 2021-11-18 NOTE — Progress Notes (Signed)
No egg or soy allergy known to patient  No issues known to pt with past sedation with any surgeries or procedures Patient denies ever being told they had issues or difficulty with intubation  No FH of Malignant Hyperthermia Pt is not on diet pills Pt is not on  home 02  Pt is not on blood thinners  Pt denies issues with constipation  No A fib or A flutter Have any cardiac testing pending--denied Pt instructed to use Singlecare.com or GoodRx for a price reduction on prep   Instructions printed for review and sent to MtyChart

## 2021-12-08 ENCOUNTER — Inpatient Hospital Stay: Payer: Medicaid Other | Attending: Nurse Practitioner

## 2021-12-08 DIAGNOSIS — C187 Malignant neoplasm of sigmoid colon: Secondary | ICD-10-CM

## 2021-12-08 DIAGNOSIS — Z85038 Personal history of other malignant neoplasm of large intestine: Secondary | ICD-10-CM | POA: Diagnosis not present

## 2021-12-08 LAB — CEA (ACCESS): CEA (CHCC): 3.41 ng/mL (ref 0.00–5.00)

## 2021-12-10 ENCOUNTER — Encounter: Payer: Self-pay | Admitting: Certified Registered Nurse Anesthetist

## 2021-12-14 ENCOUNTER — Ambulatory Visit (AMBULATORY_SURGERY_CENTER): Payer: Medicaid Other | Admitting: Gastroenterology

## 2021-12-14 ENCOUNTER — Encounter: Payer: Self-pay | Admitting: Gastroenterology

## 2021-12-14 VITALS — BP 92/68 | HR 56 | Temp 97.5°F | Resp 15 | Ht 63.0 in | Wt 185.0 lb

## 2021-12-14 DIAGNOSIS — K635 Polyp of colon: Secondary | ICD-10-CM

## 2021-12-14 DIAGNOSIS — Z08 Encounter for follow-up examination after completed treatment for malignant neoplasm: Secondary | ICD-10-CM

## 2021-12-14 DIAGNOSIS — Z85038 Personal history of other malignant neoplasm of large intestine: Secondary | ICD-10-CM

## 2021-12-14 DIAGNOSIS — D123 Benign neoplasm of transverse colon: Secondary | ICD-10-CM

## 2021-12-14 MED ORDER — SODIUM CHLORIDE 0.9 % IV SOLN: 500.0000 mL | Freq: Once | INTRAVENOUS | Status: DC

## 2021-12-14 NOTE — Progress Notes (Signed)
Referring Provider: Orma Render, NP Primary Care Physician:  Orma Render, NP  Indication for Procedure:  Colon cancer Surveillance   IMPRESSION:  Need for colon cancer surveillance Personal history of colon cancer Family history of colon cancer Appropriate candidate for monitored anesthesia care  PLAN: Colonoscopy in the [place] today   HPI: Katie Reed is a 41 y.o. female presents for surveillance colonoscopy.  Prior endoscopic history: -Flexible sigmoidoscopy with Dr. Alessandra Bevels 02/19/2017 diagnosed a stage IIb (T4a, N0) well-differentiated adenocarcinoma of the sigmoid colon, status post a robotic assisted sigmoidectomy 07/25/2017. , MSI-stable, no loss of mismatch repair protein expression. -Colonoscopy with Dr. Ardis Hughs 07/30/2018 revealed a small, hyperplastic polyp.  Surveillance recommended in 3 years.   Past Medical History:  Diagnosis Date   Abnormal glucose tolerance test (GTT) during pregnancy, antepartum 12/31/2014   Early 1 hour = 135, passed 3 hr; Third trim= 85 Proven to 9#8oz   Active labor 04/01/2015   AMA (advanced maternal age) multigravida 35+ 12/31/2014   Anxiety    Anxiety    Anxiety and depression    Colon cancer ([facility]) dx'd 12/2016   oral chemo comp 2019   Depression    High risk pregnancy due to history of preterm labor 12/31/2014   Delivered at 35 weeks   Obesity in pregnancy 12/31/2014   Preterm labor    Supervision of high-risk pregnancy 12/31/2014    Clinic   High Risk - transfer from [place]  Dating  by LMP Blood type: A/Positive/-- (10/20 0000)   Genetic Screen 1 Screen: too late   AFP: too late    Quad:  Too late    NIPS: Antibody:Negative (10/20 0000)  Anatomic US  wnl Rubella: Immune (10/20 0000)  GTT Early:   135  3 hour = 79-150-125-129 (passed)      Third trimester: 85  RPR:     Flu vaccine  declined HBsAg:   ne   Tobacco smoking affecting pregnancy, antepartum 12/31/2014    Past Surgical History:  Procedure  Laterality Date   FLEXIBLE SIGMOIDOSCOPY N/A 02/19/2017   Procedure: FLEXIBLE SIGMOIDOSCOPY;  Surgeon: Otis Brace, MD;  Location: [facility];  Service: Gastroenterology;  Laterality: N/A;   THERAPEUTIC ABORTION     elective    Current Outpatient Medications  Medication Sig Dispense Refill   tiZANidine (ZANAFLEX) 4 MG tablet Take 1 tablet (4 mg total) by mouth 2 (two) times daily as needed for muscle spasms. Do not drink alcohol or drive while taking this medication.  May cause drowsiness. 20 tablet 0   predniSONE (DELTASONE) 20 MG tablet Take 20 mg by mouth 2 (two) times daily. (Patient not taking: Reported on 12/14/2021)     sertraline (ZOLOFT) 100 MG tablet Take 1 tablet (100 mg total) by mouth at bedtime. (Patient not taking: Reported on 12/14/2021) 90 tablet 3   Current Facility-Administered Medications  Medication Dose Route Frequency Provider Last Rate Last Admin   0.9 %  sodium chloride infusion  500 mL Intravenous Once Thornton Park, MD        Allergies as of 12/14/2021   (No Known Allergies)    Family History  Problem Relation Age of Onset   Cancer Mother 89       Colon   Hypertension Mother    Colon cancer Mother    Colon cancer Sister 65 - 14   Alcohol abuse Neg Hx    Arthritis Neg Hx    Asthma Neg Hx  Birth defects Neg Hx    COPD Neg Hx    Depression Neg Hx    Diabetes Neg Hx    Drug abuse Neg Hx    Early death Neg Hx    Hearing loss Neg Hx    Heart disease Neg Hx    Hyperlipidemia Neg Hx    Kidney disease Neg Hx    Learning disabilities Neg Hx    Mental illness Neg Hx    Mental retardation Neg Hx    Miscarriages / Stillbirths Neg Hx    Stroke Neg Hx    Vision loss Neg Hx    Varicose Veins Neg Hx    Esophageal cancer Neg Hx    Rectal cancer Neg Hx    Stomach cancer Neg Hx      Physical Exam: General:   Alert,  well-nourished, pleasant and cooperative in NAD Head:  Normocephalic and atraumatic. Eyes:  Sclera clear, no icterus.    Conjunctiva pink. Mouth:  No deformity or lesions.   Neck:  Supple; no masses or thyromegaly. Lungs:  Clear throughout to auscultation.   No wheezes. Heart:  Regular rate and rhythm; no murmurs. Abdomen:  Soft, non-tender, nondistended, normal bowel sounds, no rebound or guarding.  Msk:  Symmetrical. No boney deformities LAD: No inguinal or umbilical LAD Extremities:  No clubbing or edema. Neurologic:  Alert and  oriented x4;  grossly nonfocal Skin:  No obvious rash or bruise. Psych:  Alert and cooperative. Normal mood and affect.     Studies/Results: No results found.    Makinsey Pepitone L. Tarri Glenn, MD, MPH 12/14/2021, 10:02 AM

## 2021-12-14 NOTE — Patient Instructions (Signed)
Handouts provided on polyps, diverticulosis and high fiber diet.   Recommend a high fiber diet (see handout).   Continue present medications. Await pathology results.  Repeat colonoscopy in 5 years for surveillance, earlier with new symptoms.    YOU HAD AN ENDOSCOPIC PROCEDURE TODAY AT [facility]:   Refer to the procedure report that was given to you for any specific questions about what was found during the examination.  If the procedure report does not answer your questions, please call your gastroenterologist to clarify.  If you requested that your care partner not be given the details of your procedure findings, then the procedure report has been included in a sealed envelope for you to review at your convenience later.  YOU SHOULD EXPECT: Some feelings of bloating in the abdomen. Passage of more gas than usual.  Walking can help get rid of the air that was put into your GI tract during the procedure and reduce the bloating. If you had a lower endoscopy (such as a colonoscopy or flexible sigmoidoscopy) you may notice spotting of blood in your stool or on the toilet paper. If you underwent a bowel prep for your procedure, you may not have a normal bowel movement for a few days.  Please Note:  You might notice some irritation and congestion in your nose or some drainage.  This is from the oxygen used during your procedure.  There is no need for concern and it should clear up in a day or so.  SYMPTOMS TO REPORT IMMEDIATELY:  Following lower endoscopy (colonoscopy or flexible sigmoidoscopy):  Excessive amounts of blood in the stool  Significant tenderness or worsening of abdominal pains  Swelling of the abdomen that is new, acute  Fever of 100F or higher  For urgent or emergent issues, a gastroenterologist can be reached at any hour by calling [phone]. Do not use MyChart messaging for urgent concerns.    DIET:  We do recommend a small meal at first, but then you  may proceed to your regular diet.  Drink plenty of fluids but you should avoid alcoholic beverages for 24 hours.  ACTIVITY:  You should plan to take it easy for the rest of today and you should NOT DRIVE or use heavy machinery until tomorrow (because of the sedation medicines used during the test).    FOLLOW UP: Our staff will call the number listed on your records the next business day following your procedure.  We will call around 7:15- 8:00 am to check on you and address any questions or concerns that you may have regarding the information given to you following your procedure. If we do not reach you, we will leave a message.     If any biopsies were taken you will be contacted by phone or by letter within the next 1-3 weeks.  Please call us at [phone] if you have not heard about the biopsies in 3 weeks.    SIGNATURES/CONFIDENTIALITY: You and/or your care partner have signed paperwork which will be entered into your electronic medical record.  These signatures attest to the fact that that the information above on your After Visit Summary has been reviewed and is understood.  Full responsibility of the confidentiality of this discharge information lies with you and/or your care-partner.

## 2021-12-14 NOTE — Progress Notes (Signed)
Report given to PACU, vss 

## 2021-12-14 NOTE — Op Note (Signed)
[facility] Patient Name: Katie Reed Procedure Date: 12/14/2021 10:07 AM MRN: [id number] Endoscopist: Thornton Park MD, MD Age: 41 Referring MD:  Date of Birth: [birth year] Gender: Female Account #: [id number] Procedure:                Colonoscopy Indications:              High risk colon cancer surveillance: Personal                            history of colon cancer Medicines:                Monitored Anesthesia Care Procedure:                Pre-Anesthesia Assessment:                           - Prior to the procedure, a History and Physical                            was performed, and patient medications and                            allergies were reviewed. The patient's tolerance of                            previous anesthesia was also reviewed. The risks                            and benefits of the procedure and the sedation                            options and risks were discussed with the patient.                            All questions were answered, and informed consent                            was obtained. Prior Anticoagulants: The patient has                            taken no previous anticoagulant or antiplatelet                            agents. ASA Grade Assessment: II - A patient with                            mild systemic disease. After reviewing the risks                            and benefits, the patient was deemed in                            satisfactory condition to undergo the procedure.  After obtaining informed consent, the colonoscope                            was passed under direct vision. Throughout the                            procedure, the patient's blood pressure, pulse, and                            oxygen saturations were monitored continuously. The                            CF [id number] #[id number] was introduced through the anus                            and advanced to the 3 cm into  the ileum. A second                            forward view of the right colon was performed. The                            colonoscopy was performed without difficulty. The                            patient tolerated the procedure well. The quality                            of the bowel preparation was good. The terminal                            ileum, ileocecal valve, appendiceal orifice, and                            rectum were photographed. Scope In: 10:14:00 AM Scope Out: 10:28:38 AM Scope Withdrawal Time: 0 hours 12 minutes 31 seconds  Total Procedure Duration: 0 hours 14 minutes 38 seconds  Findings:                 The perianal and digital rectal examinations were                            normal.                           A few small and large-mouthed diverticula were                            found in the sigmoid colon.                           There was evidence of a prior end-to-end                            colo-colonic anastomosis in the sigmoid colon. This  was patent and was characterized by healthy                            appearing mucosa.                           A 3 mm polyp was found in the hepatic flexure. The                            polyp was sessile. The polyp was removed with a                            cold snare. Resection and retrieval were complete.                            Estimated blood loss was minimal.                           The exam was otherwise without abnormality on                            direct and retroflexion views. Complications:            No immediate complications. Estimated Blood Loss:     Estimated blood loss was minimal. Impression:               - Diverticulosis in the sigmoid colon.                           - Patent end-to-end colo-colonic anastomosis,                            characterized by healthy appearing mucosa.                           - One 3 mm polyp at the hepatic  flexure, removed                            with a cold snare. Resected and retrieved.                           - The examination was otherwise normal on direct                            and retroflexion views. Recommendation:           - Patient has a contact number available for                            emergencies. The signs and symptoms of potential                            delayed complications were discussed with the                            patient. Return  to normal activities tomorrow.                            Written discharge instructions were provided to the                            patient.                           - High fiber diet.                           - Continue present medications.                           - Await pathology results.                           - Repeat colonoscopy in 5 years for surveillance,                            earlier with new symptoms.                           - Emerging evidence supports eating a diet of                            fruits, vegetables, grains, calcium, and yogurt                            while reducing red meat and alcohol may reduce the                            risk of colon cancer. Thornton Park MD, MD 12/14/2021 10:34:13 AM This report has been signed electronically.

## 2021-12-14 NOTE — Progress Notes (Signed)
Called to room to assist during endoscopic procedure.  Patient ID and intended procedure confirmed with present staff. Received instructions for my participation in the procedure from the performing physician.  

## 2021-12-14 NOTE — Progress Notes (Signed)
Pt's states no medical or surgical changes since previsit or office visit. VS assessed by C.W 

## 2021-12-15 ENCOUNTER — Telehealth: Payer: Self-pay

## 2021-12-15 NOTE — Telephone Encounter (Signed)
  Follow up Call-     12/14/2021    9:02 AM  Call back number  Post procedure Call Back phone  # [phone]  Permission to leave phone message Yes     Patient questions:  Do you have a fever, pain , or abdominal swelling? No. Pain Score  0 *  Have you tolerated food without any problems? Yes.    Have you been able to return to your normal activities? Yes.    Do you have any questions about your discharge instructions: Diet   No. Medications  No. Follow up visit  No.  Do you have questions or concerns about your Care? No.  Actions: * If pain score is 4 or above: No action needed, pain <4.

## 2021-12-16 ENCOUNTER — Encounter: Payer: Self-pay | Admitting: Gastroenterology

## 2021-12-26 ENCOUNTER — Telehealth: Payer: Self-pay | Admitting: Gastroenterology

## 2021-12-26 NOTE — Telephone Encounter (Signed)
Left message for patient to call back  

## 2021-12-26 NOTE — Telephone Encounter (Signed)
Patient called requesting to speak with a nurse regarding some symptoms she is having since her procedure. She states still not having a BM and is also bleeding but from her virginal area and she is not due to get  her menstrual cycle.

## 2021-12-26 NOTE — Telephone Encounter (Signed)
Inbound call from patient returning call. Please give patient a call to further advise.  Thank you

## 2021-12-27 ENCOUNTER — Other Ambulatory Visit: Payer: Self-pay

## 2021-12-27 DIAGNOSIS — Z7689 Persons encountering health services in other specified circumstances: Secondary | ICD-10-CM

## 2021-12-27 DIAGNOSIS — N939 Abnormal uterine and vaginal bleeding, unspecified: Secondary | ICD-10-CM

## 2021-12-27 NOTE — Telephone Encounter (Signed)
Patient returned call from yesterday & stated bowel movements are back to normal since procedure. She is concerned that she has been having vaginal bleeding since 12/23/21, she was not due for a cycle for another two weeks. Bleeding has lasted longer than normal. Pt wanted MD to be aware to see if this is something that can happen after a colonoscopy. Advised her to contact her GYN for their recommendations, however patient does not have a GYN. Will route to MD to make her aware. Colon was on 12/14/21 with Dr. Tarri Glenn.

## 2021-12-27 NOTE — Telephone Encounter (Signed)
Spoke with patient regarding MD recommendations. Patient would like GYN referral to be placed & will reach out to PCP while waiting for appointment.

## 2022-02-07 ENCOUNTER — Inpatient Hospital Stay: Payer: Medicaid Other | Attending: Nurse Practitioner

## 2022-02-07 ENCOUNTER — Ambulatory Visit (HOSPITAL_BASED_OUTPATIENT_CLINIC_OR_DEPARTMENT_OTHER)
Admission: RE | Admit: 2022-02-07 | Discharge: 2022-02-07 | Disposition: A | Discharge status: Home / Self Care | Payer: Medicaid Other | Source: Ambulatory Visit | Attending: Nurse Practitioner | Admitting: Nurse Practitioner

## 2022-02-07 ENCOUNTER — Ambulatory Visit: Payer: Medicaid Other | Admitting: Oncology

## 2022-02-07 DIAGNOSIS — Z85038 Personal history of other malignant neoplasm of large intestine: Secondary | ICD-10-CM | POA: Diagnosis present

## 2022-02-07 DIAGNOSIS — C187 Malignant neoplasm of sigmoid colon: Secondary | ICD-10-CM | POA: Insufficient documentation

## 2022-02-07 LAB — BASIC METABOLIC PANEL - CANCER CENTER ONLY
Anion gap: 9 (ref 5–15)
BUN: 14 mg/dL (ref 6–20)
CO2: 24 mmol/L (ref 22–32)
Calcium: 9.4 mg/dL (ref 8.9–10.3)
Chloride: 105 mmol/L (ref 98–111)
Creatinine: 0.83 mg/dL (ref 0.44–1.00)
GFR, Estimated: >60 mL/min (ref >60)
Glucose, Bld: 94 mg/dL (ref 70–99)
Potassium: 4 mmol/L (ref 3.5–5.1)
Sodium: 138 mmol/L (ref 135–145)

## 2022-02-07 MED ORDER — IOHEXOL 300 MG/ML  SOLN
100.0000 mL | Freq: Once | INTRAMUSCULAR | Status: AC | PRN
  Administered 2022-02-07: 80 mL via INTRAVENOUS

## 2022-02-08 ENCOUNTER — Other Ambulatory Visit: Payer: Self-pay | Admitting: Nurse Practitioner

## 2022-02-14 ENCOUNTER — Inpatient Hospital Stay: Payer: Medicaid Other | Admitting: Oncology

## 2022-03-01 ENCOUNTER — Other Ambulatory Visit (HOSPITAL_COMMUNITY)
Admission: RE | Admit: 2022-03-01 | Discharge: 2022-03-01 | Disposition: A | Discharge status: Home / Self Care | Payer: Medicaid Other | Source: Ambulatory Visit | Attending: Obstetrics and Gynecology | Admitting: Obstetrics and Gynecology

## 2022-03-01 ENCOUNTER — Encounter: Payer: Self-pay | Admitting: Obstetrics and Gynecology

## 2022-03-01 ENCOUNTER — Other Ambulatory Visit: Payer: Self-pay | Admitting: Nurse Practitioner

## 2022-03-01 ENCOUNTER — Ambulatory Visit (INDEPENDENT_AMBULATORY_CARE_PROVIDER_SITE_OTHER): Payer: Medicaid Other | Admitting: Obstetrics and Gynecology

## 2022-03-01 VITALS — BP 110/73 | HR 67 | Ht 63.0 in | Wt 180.0 lb

## 2022-03-01 DIAGNOSIS — N979 Female infertility, unspecified: Secondary | ICD-10-CM

## 2022-03-01 DIAGNOSIS — Z1231 Encounter for screening mammogram for malignant neoplasm of breast: Secondary | ICD-10-CM

## 2022-03-01 DIAGNOSIS — Z01419 Encounter for gynecological examination (general) (routine) without abnormal findings: Secondary | ICD-10-CM

## 2022-03-01 DIAGNOSIS — Z113 Encounter for screening for infections with a predominantly sexual mode of transmission: Secondary | ICD-10-CM | POA: Insufficient documentation

## 2022-03-01 MED ORDER — PREPLUS 27-1 MG PO TABS: 1.0000 | ORAL_TABLET | Freq: Every day | ORAL | 3 refills | Status: DC

## 2022-03-01 NOTE — Progress Notes (Signed)
42 y.o New GYN presents for AEX/PAP/STD Screening.  Last Mammogram 2021

## 2022-03-01 NOTE — Patient Instructions (Addendum)
Please have your partner reach out to his PCP for a semen analysis. If you have issues getting it ordered, let me know.  The best days to conceive are the 1-2 days before you ovulate and the day you ovulate.  You can predict the day of ovulation in 4 ways: Tracking with a calendar. This works best if you have extremely regular menstrual cycles. Tracking changes to cervical mucus. You are most likely to conceive when cervical mucus is clear and slippery. Tracking your body temperature. This is less helpful because your body temperature doesn't rise until AFTER you ovulated (too late to time intercourse) It is ideal to use a special basal body temperature thermometer since it can pick up subtle differences. You take your temperature first thing in the morning - before you get out of bed, use the bathroom, or eat/drink anything. You will need to chart your temperature every day to be able to pick up on a meaningful rise. Tracking with ovulation predictor kits. These are urine tests that you do at home. They pick up a hormone in your urine called "LH" which spikes right before you ovulate. You should time intercourse to the day of the [facility] spike. You want to start testing in the 5 days before you think you'll ovulate, but the kits usually come with instructions that explain when to use them.  If you are trying to get pregnant, take a prenatal vitamin every day. They work best to help your baby's brain & spinal cord growth if you're taking them BEFORE you get pregnant.

## 2022-03-01 NOTE — Progress Notes (Signed)
ANNUAL EXAM Patient name: Katie Reed MRN [id number]  Date of birth: 22-Sep-1980 Chief Complaint:   NEW GYN/ANNUAL  History of Present Illness:   Katie Reed is a 42 y.o. [id number] with Patient's last menstrual period was 02/13/2022 (exact date). being seen today for a routine annual exam.   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 Cycles regular w/ no intermenstrual bleeding. Not using OPKs. No prior infertility evaluation. Has had 5 term SVDs and 1 IAB several years ago.    Upstream - 03/01/22 1021       Pregnancy Intention Screening   Does the patient want to become pregnant in the next year? Yes    Does the patient's partner want to become pregnant in the next year? Yes    Would the patient like to discuss contraceptive options today? No      Contraception Wrap Up   Current Method No Contraceptive Precautions            The pregnancy intention screening data noted above was reviewed. Potential methods of contraception were discussed. The patient elected to proceed with No data recorded.   Last pap 06/04/2019. Results were: NILM w/ HRHPV negative. H/O abnormal pap: yes LSIL 2020 Last mammogram: 02/24/20. Results were: normal. Family h/o breast cancer: no Last colonoscopy: Known history of colorectal cancer s/p sigmoidectomy & chemo in 2019. No MMR/concern for lynch. Getting CSY q3 years.     03/01/2022   10:22 AM 10/07/2020    2:34 PM 01/02/2020   11:03 AM 05/01/2017    4:13 PM 12/23/2014    3:28 PM  Depression screen PHQ 2/9  Decreased Interest 0 2 1  0  Down, Depressed, Hopeless _0 0  PHQ - 2 Score _1 0  Altered sleeping 0 2 2 0   Tired, decreased energy _2 Change in appetite _3 Feeling bad or failure about yourself  1 1 0 1   Trouble concentrating 0 1 0 0   Moving slowly or fidgety/restless 0 0 2 0   Suicidal thoughts 0 0 0 0   PHQ-9 Score _4 Difficult doing work/chores Not difficult at all Extremely dIfficult          10/07/2020    2:37 PM 01/02/2020   11:04 AM 05/01/2017    4:14 PM  GAD 7 : Generalized Anxiety Score  Nervous, Anxious, on Edge 2  0  Control/stop worrying 3 2 0  Worry too much - different things _5 Trouble relaxing _6 Restless _7 Easily annoyed or irritable 2  1  Afraid - awful might happen _8 Total GAD 7 Score 14  10  Anxiety Difficulty Extremely difficult     Review of Systems:   Pertinent items are noted in HPI Denies any headaches, blurred vision, fatigue, shortness of breath, chest pain, abdominal pain, abnormal vaginal discharge/itching/odor/irritation, problems with periods, bowel movements, urination, or intercourse unless otherwise stated above. Pertinent History Reviewed:  Reviewed past medical,surgical, social and family history.  Reviewed problem list, medications and allergies. Physical Assessment:   Vitals:   03/01/22 1012  BP: 110/73  Pulse: 67  Weight: 180 lb (81.6 kg)  Height: _9  (1.6 m)  Body mass index is 31.89 kg/m.  Physical Examination:   General appearance - well appearing, and in no distress  Mental status - alert, oriented to person, place, and time  Chest - respiratory effort normal  Heart - normal peripheral perfusion  Breasts - breasts appear normal, no suspicious masses, no skin or nipple changes or axillary nodes  Abdomen - soft, nontender, nondistended, no masses or organomegaly  Pelvic - VULVA: normal appearing vulva with no masses, tenderness or lesions  VAGINA: normal appearing vagina with normal color and discharge, no lesions  CERVIX: normal texture, no masses  UTERUS: 15wk uterus, nontender, mobile  ADNEXA: No adnexal masses or tenderness noted.  Chaperone present for exam  No results found for this or any previous visit (from the past 24 hour(s)).  Assessment & Plan:  1) Well-Woman Exam Mammogram: schedule screening mammo as  soon as possible, or sooner if problems Colonoscopy: per GI, or sooner if problems Pap: Due 2026 Gardasil: n/a GC/CT: Collected HIV/HCV: Collected  2) Secondary infertility - Discussed the utility of tracking cycles +/- ovulation predictor kits with timed intercourse the 1-2 days prior to ovulation and the day of ovulation.  - Semen analysis - pt's partner will contact PCP to order - D3 FSH, E2, & AMH - TSH - Sonohysterogram with tubal patency - Counseled patient to start PNV - Referral to REI placed given age and hx colon ca  Labs/procedures today:  Orders Placed This Encounter  Procedures   MM 3D SCREEN BREAST BILATERAL   US Sonohysterogram   FSH/LH   Anti-Mullerian Hormone ([place]), Female   Estradiol   TSH   Hepatitis B Surface AntiGEN   Hepatitis C Antibody   HIV antibody (with reflex)   RPR   Ambulatory referral to Infertility   Meds:  Meds ordered this encounter  Medications   Prenatal Vit-Fe Fumarate-FA (PREPLUS) 27-1 MG TABS    Sig: Take 1 tablet by mouth daily.    Dispense:  90 tablet    Refill:  3    Follow-up: Return in 15 days (on 03/16/2022) for lab visit - must be 1/18 or 1/19.  [name], MD 03/01/2022 3:44 PM

## 2022-03-02 LAB — CERVICOVAGINAL ANCILLARY ONLY
Bacterial Vaginitis (gardnerella): POSITIVE — AB
Candida Glabrata: NEGATIVE
Candida Vaginitis: NEGATIVE
Chlamydia: NEGATIVE
Comment: NEGATIVE
Comment: NEGATIVE
Comment: NEGATIVE
Comment: NEGATIVE
Comment: NEGATIVE
Comment: NORMAL
Neisseria Gonorrhea: NEGATIVE
Trichomonas: POSITIVE — AB

## 2022-03-02 MED ORDER — METRONIDAZOLE 500 MG PO TABS: 500.0000 mg | ORAL_TABLET | Freq: Two times a day (BID) | ORAL | 0 refills | Status: AC

## 2022-03-02 NOTE — Addendum Note (Signed)
Addended by: Gale Journey on: 03/02/2022 05:12 PM   Modules accepted: Orders

## 2022-03-03 ENCOUNTER — Telehealth: Payer: Self-pay | Admitting: Emergency Medicine

## 2022-03-03 NOTE — Telephone Encounter (Signed)
-----   Message from [name], MD sent at 03/02/2022  5:12 PM EST ----- + for trich. Notified in [place] comment and rx sent. Routing for coordination of repeat testing as noted below. Please let me know if you need anything else! - KF

## 2022-03-03 NOTE — Telephone Encounter (Signed)
Pt informed of results, and has picked up Rx.

## 2022-03-07 ENCOUNTER — Encounter: Payer: Self-pay | Admitting: Internal Medicine

## 2022-03-20 ENCOUNTER — Encounter: Payer: Self-pay | Admitting: Obstetrics and Gynecology

## 2022-03-20 ENCOUNTER — Other Ambulatory Visit: Payer: Self-pay | Admitting: Emergency Medicine

## 2022-03-20 DIAGNOSIS — N898 Other specified noninflammatory disorders of vagina: Secondary | ICD-10-CM

## 2022-03-20 MED ORDER — FLUCONAZOLE 150 MG PO TABS: 150.0000 mg | ORAL_TABLET | Freq: Once | ORAL | 0 refills | Status: AC

## 2022-03-20 NOTE — Progress Notes (Signed)
Rx for vaginal itching.

## 2022-03-21 ENCOUNTER — Other Ambulatory Visit: Payer: Self-pay | Admitting: Emergency Medicine

## 2022-03-21 MED ORDER — FLUCONAZOLE 150 MG PO TABS: 150.0000 mg | ORAL_TABLET | Freq: Once | ORAL | 0 refills | Status: DC

## 2022-03-21 MED ORDER — FLUCONAZOLE 150 MG PO TABS: 150.0000 mg | ORAL_TABLET | Freq: Once | ORAL | 0 refills | Status: AC

## 2022-03-21 NOTE — Progress Notes (Signed)
Rx for vaginal yeast

## 2022-03-22 ENCOUNTER — Encounter: Payer: Self-pay | Admitting: Obstetrics and Gynecology

## 2022-03-27 ENCOUNTER — Encounter: Payer: Self-pay | Admitting: Obstetrics and Gynecology

## 2022-03-29 ENCOUNTER — Ambulatory Visit (INDEPENDENT_AMBULATORY_CARE_PROVIDER_SITE_OTHER): Payer: Medicaid Other | Admitting: Emergency Medicine

## 2022-03-29 ENCOUNTER — Other Ambulatory Visit (HOSPITAL_COMMUNITY)
Admission: RE | Admit: 2022-03-29 | Discharge: 2022-03-29 | Disposition: A | Discharge status: Home / Self Care | Payer: Medicaid Other | Source: Ambulatory Visit | Attending: Obstetrics and Gynecology | Admitting: Obstetrics and Gynecology

## 2022-03-29 VITALS — BP 109/78 | HR 80 | Ht 63.0 in | Wt 184.1 lb

## 2022-03-29 DIAGNOSIS — Z113 Encounter for screening for infections with a predominantly sexual mode of transmission: Secondary | ICD-10-CM

## 2022-03-29 NOTE — Progress Notes (Signed)
SUBJECTIVE:  42 y.o. female desires TOC for positive trich on 1/3. Denies abnormal discharge, abnormal vaginal bleeding or significant pelvic pain or fever. No UTI symptoms. Denies history of known exposure to STD.    OBJECTIVE:  She appears well, afebrile.   PLAN:  GC, chlamydia, trichomonas, BVAG, CVAG probe sent to lab. Treatment: To be determined once lab results are received ROV prn if symptoms persist or worsen.

## 2022-03-30 ENCOUNTER — Other Ambulatory Visit: Payer: Self-pay

## 2022-03-30 DIAGNOSIS — B9689 Other specified bacterial agents as the cause of diseases classified elsewhere: Secondary | ICD-10-CM

## 2022-03-30 LAB — CERVICOVAGINAL ANCILLARY ONLY
Bacterial Vaginitis (gardnerella): POSITIVE — AB
Candida Glabrata: NEGATIVE
Candida Vaginitis: NEGATIVE
Chlamydia: NEGATIVE
Comment: NEGATIVE
Comment: NEGATIVE
Comment: NEGATIVE
Comment: NEGATIVE
Comment: NEGATIVE
Comment: NORMAL
Neisseria Gonorrhea: NEGATIVE
Trichomonas: NEGATIVE

## 2022-03-30 MED ORDER — METRONIDAZOLE 0.75 % VA GEL: 1.0000 | Freq: Every day | VAGINAL | 1 refills | Status: DC

## 2022-04-05 ENCOUNTER — Other Ambulatory Visit: Payer: Self-pay | Admitting: Obstetrics and Gynecology

## 2022-04-05 DIAGNOSIS — N979 Female infertility, unspecified: Secondary | ICD-10-CM

## 2022-04-05 NOTE — Progress Notes (Signed)
Order entered for HSG. Can we ask radiology to reach out to the patient with an estimate of out of pocket cost for an HSG and for a transvaginal pelvic ultrasound?  Thanks! KF

## 2022-05-02 ENCOUNTER — Encounter (HOSPITAL_COMMUNITY): Payer: Self-pay | Admitting: Emergency Medicine

## 2022-05-02 ENCOUNTER — Ambulatory Visit (HOSPITAL_COMMUNITY)
Admission: EM | Admit: 2022-05-02 | Discharge: 2022-05-02 | Disposition: A | Discharge status: Home / Self Care | Payer: Medicaid Other | Attending: Family Medicine | Admitting: Family Medicine

## 2022-05-02 ENCOUNTER — Ambulatory Visit (INDEPENDENT_AMBULATORY_CARE_PROVIDER_SITE_OTHER): Payer: Medicaid Other

## 2022-05-02 ENCOUNTER — Other Ambulatory Visit: Payer: Self-pay

## 2022-05-02 DIAGNOSIS — G8929 Other chronic pain: Secondary | ICD-10-CM

## 2022-05-02 DIAGNOSIS — M25551 Pain in right hip: Secondary | ICD-10-CM

## 2022-05-02 DIAGNOSIS — M545 Low back pain, unspecified: Secondary | ICD-10-CM

## 2022-05-02 DIAGNOSIS — M5412 Radiculopathy, cervical region: Secondary | ICD-10-CM

## 2022-05-02 DIAGNOSIS — M25552 Pain in left hip: Secondary | ICD-10-CM | POA: Diagnosis not present

## 2022-05-02 DIAGNOSIS — R102 Pelvic and perineal pain: Secondary | ICD-10-CM | POA: Diagnosis not present

## 2022-05-02 MED ORDER — METHYLPREDNISOLONE 4 MG PO TBPK: ORAL_TABLET | ORAL | 0 refills | Status: DC

## 2022-05-02 MED ORDER — KETOROLAC TROMETHAMINE 30 MG/ML IJ SOLN
30.0000 mg | Freq: Once | INTRAMUSCULAR | Status: AC
  Administered 2022-05-02: 30 mg via INTRAMUSCULAR

## 2022-05-02 MED ORDER — TIZANIDINE HCL 4 MG PO TABS: 4.0000 mg | ORAL_TABLET | Freq: Four times a day (QID) | ORAL | 0 refills | Status: DC | PRN

## 2022-05-02 MED ORDER — KETOROLAC TROMETHAMINE 30 MG/ML IJ SOLN
INTRAMUSCULAR | Status: AC
  Filled 2022-05-02: qty 1

## 2022-05-02 NOTE — ED Provider Notes (Signed)
[name]    CSN: [id number] Arrival date & time: 05/02/22  1229      History   Chief Complaint Chief Complaint  Patient presents with   Back Pain    HPI Katie Reed is a 42 y.o. female.   Patient is here for a flare of chronic pain.  She is having pain in her lower back and hips.  She feels that the low back/spine is not turning the right way, bones feel like they are grinding.   Worse in the morning and evening.  She was in severe pain this morning just to get out of bed.  No numbness/tingling in the legs or buttocks.  She has chronic neck pain, right arm numbness/tingling.  She has had an MRI in the past of her cervical spine but not of her lumbar spine.  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  Seh is not taking anything for pain currently.        Past Medical History:  Diagnosis Date   Abnormal glucose tolerance test (GTT) during pregnancy, antepartum 12/31/2014   Early 1 hour = 135, passed 3 hr; Third trim= 85 Proven to 9#8oz   Active labor 04/01/2015   AMA (advanced maternal age) multigravida 35+ 12/31/2014   Anxiety    Anxiety    Anxiety and depression    Colon cancer ([place]) dx'd 12/2016   oral chemo comp 2019   Depression    High risk pregnancy due to history of preterm labor 12/31/2014   Delivered at 35 weeks   Obesity in pregnancy 12/31/2014   Preterm labor    Supervision of high-risk pregnancy 12/31/2014    Clinic   High Risk - transfer from [facility]  Dating  by LMP Blood type: A/Positive/-- (10/20 0000)   Genetic Screen 1 Screen: too late   AFP: too late    Quad:  Too late    NIPS: Antibody:Negative (10/20 0000)  Anatomic US  wnl Rubella: Immune (10/20 0000)  GTT Early:   135  3 hour = 79-150-125-129 (passed)      Third trimester: 85  RPR:     Flu vaccine  declined HBsAg:   ne   Tobacco smoking affecting pregnancy, antepartum 12/31/2014     Patient Active Problem List   Diagnosis Date Noted   Moderate episode of recurrent major depressive disorder ([place]) 11/08/2020   Generalized anxiety disorder 11/08/2020   Discogenic lumbar pain 11/08/2020   Cervical disc displacement 11/08/2020   Encounter to establish care with new doctor 10/07/2020   Right arm numbness 10/07/2020   Weakness of right arm 10/07/2020   Psychophysiological insomnia 10/07/2020   Bone spur 10/07/2020   Muscle spasms of neck 10/07/2020   Colon cancer ([place]) 08/07/2017   Colonic mass 07/25/2017   Intussusception intestine ([place]) 02/19/2017   ASCUS with positive high risk HPV 03/23/2015    Past Surgical History:  Procedure Laterality Date   FLEXIBLE SIGMOIDOSCOPY N/A 02/19/2017   Procedure: FLEXIBLE SIGMOIDOSCOPY;  Surgeon: Otis Brace, MD;  Location: [facility];  Service: Gastroenterology;  Laterality: N/A;   THERAPEUTIC ABORTION     elective    OB History     Gravida  6   Para  5   Term  4   Preterm  1   AB  1   Living  5      SAB  0   IAB  1   Ectopic  0   Multiple  0   Live Births  5            Home Medications    Prior to Admission medications   Medication Sig Start Date End Date Taking? Authorizing Provider  metroNIDAZOLE (METROGEL) 0.75 % vaginal gel Place 1 Applicatorful vaginally at bedtime. Apply one applicatorful to vagina at bedtime for 5 days Patient not taking: Reported on 05/02/2022 03/30/22   Chancy Milroy, MD  predniSONE (DELTASONE) 20 MG tablet Take 20 mg by mouth 2 (two) times daily. Patient not taking: Reported on 12/14/2021 07/20/21   [provider]  Prenatal Vit-Fe Fumarate-FA (PREPLUS) 27-1 MG TABS Take 1 tablet by mouth daily. Patient not taking: Reported on 03/29/2022 03/01/22   [name], MD  sertraline (ZOLOFT) 100 MG tablet Take 1 tablet (100 mg total) by mouth at bedtime. 10/21/21   Orma Render, NP  tiZANidine (ZANAFLEX) 4 MG tablet Take 1 tablet (4 mg total) by  mouth 2 (two) times daily as needed for muscle spasms. Do not drink alcohol or drive while taking this medication.  May cause drowsiness. Patient not taking: Reported on 03/01/2022 09/12/21   Early, Coralee Pesa, NP    Family History Family History  Problem Relation Age of Onset   Cancer Mother 91       Colon   Hypertension Mother    Colon cancer Mother    Cancer Sister    Colon cancer Sister 66 - 29   Alcohol abuse Neg Hx    Arthritis Neg Hx    Asthma Neg Hx    Birth defects Neg Hx    COPD Neg Hx    Depression Neg Hx    Diabetes Neg Hx    Drug abuse Neg Hx    Early death Neg Hx    Hearing loss Neg Hx    Heart disease Neg Hx    Hyperlipidemia Neg Hx    Kidney disease Neg Hx    Learning disabilities Neg Hx    Mental illness Neg Hx    Mental retardation Neg Hx    Miscarriages / Stillbirths Neg Hx    Stroke Neg Hx    Vision loss Neg Hx    Varicose Veins Neg Hx    Esophageal cancer Neg Hx    Rectal cancer Neg Hx    Stomach cancer Neg Hx     Social History Social History   Tobacco Use   Smoking status: Some Days    Packs/day: 0.25    Years: 14.00    Total pack years: 3.50    Types: Cigarettes   Smokeless tobacco: Never  Vaping Use   Vaping Use: Never used  Substance Use Topics   Alcohol use: Yes   Drug use: No     Allergies   Patient has no known allergies.   Review of Systems Review of Systems  Constitutional: Negative.   HENT: Negative.    Respiratory: Negative.    Cardiovascular: Negative.   Gastrointestinal: Negative.   Musculoskeletal:  Positive for back pain.  Neurological:  Positive for numbness.     Physical Exam Triage Vital Signs ED Triage Vitals  Enc Vitals Group     BP 05/02/22 1502 116/66     Pulse Rate 05/02/22 1502 68     Resp 05/02/22 1502 18     Temp 05/02/22 1502 98.1 F (36.7 C)     Temp  Source 05/02/22 1502 Oral     SpO2 05/02/22 1502 96 %     Weight --      Height --      Head Circumference --      Peak Flow --      Pain  Score 05/02/22 1459 8     Pain Loc --      Pain Edu? --      Excl. in [place]? --    No data found.  Updated Vital Signs BP 116/66 (BP Location: Left Arm)   Pulse 68   Temp 98.1 F (36.7 C) (Oral)   Resp 18   LMP 04/27/2022   SpO2 96%   Visual Acuity Right Eye Distance:   Left Eye Distance:   Bilateral Distance:    Right Eye Near:   Left Eye Near:    Bilateral Near:     Physical Exam Constitutional:      General: She is not in acute distress.    Appearance: Normal appearance. She is not ill-appearing.     Comments: She is tearful  Cardiovascular:     Rate and Rhythm: Normal rate.  Pulmonary:     Effort: Pulmonary effort is normal.  Musculoskeletal:     Comments: No spinous tenderness;  + TTP to the right paraspinals;  decreased rom at the neck and right shoulder/arm.  + TTP to the lumbar spine and across the low back into the hips bilaterally;  decreased rom due to pain  Skin:    General: Skin is warm.  Neurological:     Mental Status: She is alert.  Psychiatric:        Mood and Affect: Mood normal.      UC Treatments / Results  Labs (all labs ordered are listed, but only abnormal results are displayed) Labs Reviewed - No data to display  EKG   Radiology No results found.  Procedures Procedures (including critical care time)  Medications Ordered in UC Medications  ketorolac (TORADOL) 30 MG/ML injection 30 mg (has no administration in time range)    Initial Impression / Assessment and Plan / UC Course  I have reviewed the triage vital signs and the nursing notes.  Pertinent labs & imaging results that were available during my care of the patient were reviewed by me and considered in my medical decision making (see chart for details).   Final Clinical Impressions(s) / UC Diagnoses   Final diagnoses:  Chronic bilateral low back pain without sciatica  Bilateral hip pain  Cervical radiculopathy     Discharge Instructions      You were seen  today for worsening of chronic pain.  I have given you a shot of toradol for pain.  I have sent out a steroid pack and refilled your muscle relaxer.  I cannot give pain medication for chronic pain.  You may continue tylenol or motrin for pain at home.  Your xray today did not show anything concerning today.  I recommend you find a primary care provider for further care and work up of this chronic pain.  Please go to [email or web address] to find a provider.     ED Prescriptions     Medication Sig Dispense Auth. Provider   tiZANidine (ZANAFLEX) 4 MG tablet Take 1 tablet (4 mg total) by mouth every 6 (six) hours as needed for muscle spasms. 30 tablet Kyrus Hyde, MD   methylPREDNISolone (MEDROL DOSEPAK) 4 MG TBPK tablet Take as directed 1 each Rondel Oh, MD  PDMP not reviewed this encounter.   Rondel Oh, MD 05/02/22 [phone]

## 2022-05-02 NOTE — Discharge Instructions (Addendum)
You were seen today for worsening of chronic pain.  I have given you a shot of toradol for pain.  I have sent out a steroid pack and refilled your muscle relaxer.  I cannot give pain medication for chronic pain.  You may continue tylenol or motrin for pain at home.  Your xray today did not show anything concerning today.  I recommend you find a primary care provider for further care and work up of this chronic pain.  Please go to [email or web address] to find a provider.

## 2022-05-02 NOTE — ED Triage Notes (Signed)
Back and hip pain and arm numbness.  Back and right pelvis pain for several years.  Right arm numbness for a year.  Patient has had studies on all these areas.    No pcp, does have a cancer doctor.    Patient has had steroids before for this.  Has had muscle relaxers

## 2022-05-11 ENCOUNTER — Ambulatory Visit: Payer: Medicaid Other

## 2022-07-31 ENCOUNTER — Ambulatory Visit (HOSPITAL_COMMUNITY)
Admission: EM | Admit: 2022-07-31 | Discharge: 2022-07-31 | Discharge status: Against Medical Advice | Payer: Medicaid Other

## 2022-07-31 ENCOUNTER — Ambulatory Visit (HOSPITAL_COMMUNITY)
Admission: EM | Admit: 2022-07-31 | Discharge: 2022-07-31 | Disposition: A | Discharge status: Home / Self Care | Payer: Medicaid Other | Attending: Internal Medicine | Admitting: Internal Medicine

## 2022-07-31 ENCOUNTER — Encounter (HOSPITAL_COMMUNITY): Payer: Self-pay

## 2022-07-31 DIAGNOSIS — M545 Low back pain, unspecified: Secondary | ICD-10-CM | POA: Diagnosis not present

## 2022-07-31 DIAGNOSIS — G8929 Other chronic pain: Secondary | ICD-10-CM

## 2022-07-31 LAB — POCT URINALYSIS DIP (MANUAL ENTRY)
Bilirubin, UA: NEGATIVE
Blood, UA: NEGATIVE
Glucose, UA: NEGATIVE mg/dL
Ketones, POC UA: NEGATIVE mg/dL
Leukocytes, UA: NEGATIVE
Nitrite, UA: NEGATIVE
Protein Ur, POC: NEGATIVE mg/dL
Spec Grav, UA: >=1.03 — AB (ref 1.010–1.025)
Urobilinogen, UA: 1 E.U./dL
pH, UA: 7.5 (ref 5.0–8.0)

## 2022-07-31 LAB — POCT URINE PREGNANCY: Preg Test, Ur: NEGATIVE

## 2022-07-31 MED ORDER — KETOROLAC TROMETHAMINE 30 MG/ML IJ SOLN
30.0000 mg | Freq: Once | INTRAMUSCULAR | Status: AC
  Administered 2022-07-31: 30 mg via INTRAMUSCULAR

## 2022-07-31 MED ORDER — METHOCARBAMOL 500 MG PO TABS: 500.0000 mg | ORAL_TABLET | Freq: Every evening | ORAL | 0 refills | Status: DC | PRN

## 2022-07-31 MED ORDER — KETOROLAC TROMETHAMINE 30 MG/ML IJ SOLN
INTRAMUSCULAR | Status: AC
  Filled 2022-07-31: qty 1

## 2022-07-31 MED ORDER — NAPROXEN 375 MG PO TABS: 375.0000 mg | ORAL_TABLET | Freq: Two times a day (BID) | ORAL | 0 refills | Status: DC

## 2022-07-31 NOTE — Discharge Instructions (Addendum)
Gentle range of motion exercises Please take medications as prescribed Heating pad only 20 minutes on-20 minutes off cycle Return to urgent care if symptoms worsen.

## 2022-07-31 NOTE — ED Triage Notes (Addendum)
Pt presents with back pain and lower abd pain. She is here to get an injection to reduce the pain.

## 2022-07-31 NOTE — ED Provider Notes (Signed)
[facility]    CSN: [id number] Arrival date & time: 07/31/22  1502      History   Chief Complaint Chief Complaint  Patient presents with   Abdominal Pain    HPI Katie Reed is a 42 y.o. female with a history of recurrent low back pain comes to urgent care with severe sharp lower back pain which started yesterday.  Pain is constant, severe-currently 10 out of 10, aggravated by movement.  No known relieving factors.  No weakness in the lower extremities.  No falls or trauma to the back.  No heavy lifting.  No numbness or tingling in the lower extremities.  No radiation of the pain.  Patient complains of worsening pain when she tries to spread her legs apart.  No urinary or bowel difficulties. HPI  Past Medical History:  Diagnosis Date   Abnormal glucose tolerance test (GTT) during pregnancy, antepartum 12/31/2014   Early 1 hour = 135, passed 3 hr; Third trim= 85 Proven to 9#8oz   Active labor 04/01/2015   AMA (advanced maternal age) multigravida 35+ 12/31/2014   Anxiety    Anxiety    Anxiety and depression    Colon cancer ([facility]) dx'd 12/2016   oral chemo comp 2019   Depression    High risk pregnancy due to history of preterm labor 12/31/2014   Delivered at 35 weeks   Obesity in pregnancy 12/31/2014   Preterm labor    Supervision of high-risk pregnancy 12/31/2014    Clinic   High Risk - transfer from [facility] Prenatal Labs  Dating  by LMP Blood type: A/Positive/-- (10/20 0000)   Genetic Screen 1 Screen: too late   AFP: too late    Quad:  Too late    NIPS: Antibody:Negative (10/20 0000)  Anatomic US  wnl Rubella: Immune (10/20 0000)  GTT Early:   135  3 hour = 79-150-125-129 (passed)      Third trimester: 85  RPR:     Flu vaccine  declined HBsAg:   ne   Tobacco smoking affecting pregnancy, antepartum 12/31/2014    Patient Active Problem List   Diagnosis Date Noted   Moderate episode of recurrent major depressive disorder ([facility]) 11/08/2020   Generalized anxiety  disorder 11/08/2020   Discogenic lumbar pain 11/08/2020   Cervical disc displacement 11/08/2020   Encounter to establish care with new doctor 10/07/2020   Right arm numbness 10/07/2020   Weakness of right arm 10/07/2020   Psychophysiological insomnia 10/07/2020   Bone spur 10/07/2020   Muscle spasms of neck 10/07/2020   Colon cancer ([facility]) 08/07/2017   Colonic mass 07/25/2017   Intussusception intestine ([facility]) 02/19/2017   ASCUS with positive high risk HPV 03/23/2015    Past Surgical History:  Procedure Laterality Date   FLEXIBLE SIGMOIDOSCOPY N/A 02/19/2017   Procedure: FLEXIBLE SIGMOIDOSCOPY;  Surgeon: Kathi Der, MD;  Location: [facility];  Service: Gastroenterology;  Laterality: N/A;   THERAPEUTIC ABORTION     elective    OB History     Gravida  6   Para  5   Term  4   Preterm  1   AB  1   Living  5      SAB  0   IAB  1   Ectopic  0   Multiple  0   Live Births  5            Home Medications    Prior to Admission medications   Medication  Sig Start Date End Date Taking? Authorizing Provider  methocarbamol (ROBAXIN) 500 MG tablet Take 1 tablet (500 mg total) by mouth at bedtime as needed for muscle spasms. 07/31/22  Yes Liliah Dorian, Britta Mccreedy, MD  naproxen (NAPROSYN) 375 MG tablet Take 1 tablet (375 mg total) by mouth 2 (two) times daily. 07/31/22  Yes Kadia Abaya, Britta Mccreedy, MD  methylPREDNISolone (MEDROL DOSEPAK) 4 MG TBPK tablet Take as directed 05/02/22   Jannifer Franklin, MD  sertraline (ZOLOFT) 100 MG tablet Take 1 tablet (100 mg total) by mouth at bedtime. 10/21/21   Tollie Eth, NP    Family History Family History  Problem Relation Age of Onset   Cancer Mother 54       Colon   Hypertension Mother    Colon cancer Mother    Cancer Sister    Colon cancer Sister 38 - 53   Alcohol abuse Neg Hx    Arthritis Neg Hx    Asthma Neg Hx    Birth defects Neg Hx    COPD Neg Hx    Depression Neg Hx    Diabetes Neg Hx    Drug abuse Neg Hx    Early death  Neg Hx    Hearing loss Neg Hx    Heart disease Neg Hx    Hyperlipidemia Neg Hx    Kidney disease Neg Hx    Learning disabilities Neg Hx    Mental illness Neg Hx    Mental retardation Neg Hx    Miscarriages / Stillbirths Neg Hx    Stroke Neg Hx    Vision loss Neg Hx    Varicose Veins Neg Hx    Esophageal cancer Neg Hx    Rectal cancer Neg Hx    Stomach cancer Neg Hx     Social History Social History   Tobacco Use   Smoking status: Some Days    Packs/day: 0.25    Years: 14.00    Additional pack years: 0.00    Total pack years: 3.50    Types: Cigarettes   Smokeless tobacco: Never  Vaping Use   Vaping Use: Never used  Substance Use Topics   Alcohol use: Yes   Drug use: No     Allergies   Patient has no known allergies.   Review of Systems Review of Systems As per HPI  Physical Exam Triage Vital Signs ED Triage Vitals  Enc Vitals Group     BP 07/31/22 1551 100/63     Pulse Rate 07/31/22 1550 68     Resp 07/31/22 1550 18     Temp 07/31/22 1550 97.9 F (36.6 C)     Temp Source 07/31/22 1550 Oral     SpO2 07/31/22 1550 98 %     Weight --      Height --      Head Circumference --      Peak Flow --      Pain Score 07/31/22 1549 6     Pain Loc --      Pain Edu? --      Excl. in [place]? --    No data found.  Updated Vital Signs BP 100/63   Pulse 68   Temp 97.9 F (36.6 C) (Oral)   Resp 18   LMP 07/15/2022 (Exact Date)   SpO2 98%   Visual Acuity Right Eye Distance:   Left Eye Distance:   Bilateral Distance:    Right Eye Near:   Left Eye Near:  Bilateral Near:     Physical Exam Vitals and nursing note reviewed.  Constitutional:      General: She is not in acute distress.    Appearance: She is not ill-appearing.  Cardiovascular:     Rate and Rhythm: Normal rate and regular rhythm.  Pulmonary:     Effort: Pulmonary effort is normal.     Breath sounds: Normal breath sounds.  Abdominal:     General: Bowel sounds are normal.     Palpations:  Abdomen is soft. There is no shifting dullness, hepatomegaly or splenomegaly.     Tenderness: There is no abdominal tenderness.     Hernia: No hernia is present.     Comments: Tenderness over the sacrococcygeal region.  No bruising.  Neurological:     Mental Status: She is alert.      UC Treatments / Results  Labs (all labs ordered are listed, but only abnormal results are displayed) Labs Reviewed  POCT URINALYSIS DIP (MANUAL ENTRY) - Abnormal; Notable for the following components:      Result Value   Spec Grav, UA >=1.030 (*)    All other components within normal limits  POCT URINE PREGNANCY    EKG   Radiology No results found.  Procedures Procedures (including critical care time)  Medications Ordered in UC Medications  ketorolac (TORADOL) 30 MG/ML injection 30 mg (30 mg Intramuscular Given 07/31/22 1706)    Initial Impression / Assessment and Plan / UC Course  I have reviewed the triage vital signs and the nursing notes.  Pertinent labs & imaging results that were available during my care of the patient were reviewed by me and considered in my medical decision making (see chart for details).     1.  Chronic bilateral low back pain without sciatica: Toradol 30 mg IM x 1 dose Naproxen 375 mg twice daily as needed for pain Robaxin 500 mg at bedtime-medication precautions given Heating pad use only 20 minutes on-20 minutes off cycle Gentle range of motion exercises as the pain improves Return precautions given. Final Clinical Impressions(s) / UC Diagnoses   Final diagnoses:  Chronic bilateral low back pain without sciatica     Discharge Instructions      Gentle range of motion exercises    ED Prescriptions     Medication Sig Dispense Auth. Provider   naproxen (NAPROSYN) 375 MG tablet Take 1 tablet (375 mg total) by mouth 2 (two) times daily. 30 tablet Britten Seyfried, Britta Mccreedy, MD   methocarbamol (ROBAXIN) 500 MG tablet Take 1 tablet (500 mg total) by mouth at  bedtime as needed for muscle spasms. 20 tablet Rehan Holness, Britta Mccreedy, MD      PDMP not reviewed this encounter.   Merrilee Jansky, MD 07/31/22 Windy Fast

## 2022-08-01 ENCOUNTER — Ambulatory Visit (HOSPITAL_COMMUNITY): Payer: Medicaid Other

## 2022-08-03 ENCOUNTER — Encounter (HOSPITAL_COMMUNITY): Payer: Self-pay

## 2022-08-03 ENCOUNTER — Emergency Department (HOSPITAL_COMMUNITY): Payer: Medicaid Other

## 2022-08-03 ENCOUNTER — Other Ambulatory Visit: Payer: Self-pay

## 2022-08-03 ENCOUNTER — Emergency Department (HOSPITAL_COMMUNITY)
Admission: EM | Admit: 2022-08-03 | Discharge: 2022-08-04 | Disposition: A | Discharge status: Home / Self Care | Payer: Medicaid Other | Attending: Emergency Medicine | Admitting: Emergency Medicine

## 2022-08-03 DIAGNOSIS — F17210 Nicotine dependence, cigarettes, uncomplicated: Secondary | ICD-10-CM | POA: Insufficient documentation

## 2022-08-03 DIAGNOSIS — R1031 Right lower quadrant pain: Secondary | ICD-10-CM | POA: Insufficient documentation

## 2022-08-03 DIAGNOSIS — Z85038 Personal history of other malignant neoplasm of large intestine: Secondary | ICD-10-CM | POA: Diagnosis not present

## 2022-08-03 LAB — COMPREHENSIVE METABOLIC PANEL
ALT: 24 U/L (ref 0–44)
AST: 16 U/L (ref 15–41)
Albumin: 4 g/dL (ref 3.5–5.0)
Alkaline Phosphatase: 39 U/L (ref 38–126)
Anion gap: 9 (ref 5–15)
BUN: 21 mg/dL — ABNORMAL HIGH (ref 6–20)
CO2: 23 mmol/L (ref 22–32)
Calcium: 9 mg/dL (ref 8.9–10.3)
Chloride: 105 mmol/L (ref 98–111)
Creatinine, Ser: 1.04 mg/dL — ABNORMAL HIGH (ref 0.44–1.00)
GFR, Estimated: >60 mL/min (ref >60)
Glucose, Bld: 114 mg/dL — ABNORMAL HIGH (ref 70–99)
Potassium: 3.9 mmol/L (ref 3.5–5.1)
Sodium: 137 mmol/L (ref 135–145)
Total Bilirubin: 0.4 mg/dL (ref 0.3–1.2)
Total Protein: 7.2 g/dL (ref 6.5–8.1)

## 2022-08-03 LAB — CBC
HCT: 36.1 % (ref 36.0–46.0)
Hemoglobin: 11.8 g/dL — ABNORMAL LOW (ref 12.0–15.0)
MCH: 30.8 pg (ref 26.0–34.0)
MCHC: 32.7 g/dL (ref 30.0–36.0)
MCV: 94.3 fL (ref 80.0–100.0)
Platelets: 311 10*3/uL (ref 150–400)
RBC: 3.83 MIL/uL — ABNORMAL LOW (ref 3.87–5.11)
RDW: 13.5 % (ref 11.5–15.5)
WBC: 7.2 10*3/uL (ref 4.0–10.5)
nRBC: 0 % (ref 0.0–0.2)

## 2022-08-03 LAB — URINALYSIS, ROUTINE W REFLEX MICROSCOPIC
Bacteria, UA: NONE SEEN
Bilirubin Urine: NEGATIVE
Glucose, UA: NEGATIVE mg/dL
Ketones, ur: NEGATIVE mg/dL
Leukocytes,Ua: NEGATIVE
Nitrite: NEGATIVE
Protein, ur: NEGATIVE mg/dL
Specific Gravity, Urine: 1.024 (ref 1.005–1.030)
pH: 5 (ref 5.0–8.0)

## 2022-08-03 LAB — LIPASE, BLOOD: Lipase: 35 U/L (ref 11–51)

## 2022-08-03 LAB — I-STAT BETA HCG BLOOD, ED (MC, WL, AP ONLY): I-stat hCG, quantitative: <5 m[IU]/mL (ref <5)

## 2022-08-03 MED ORDER — IOHEXOL 300 MG/ML  SOLN
100.0000 mL | Freq: Once | INTRAMUSCULAR | Status: AC | PRN
  Administered 2022-08-03: 100 mL via INTRAVENOUS

## 2022-08-03 MED ORDER — HYDROMORPHONE HCL 1 MG/ML IJ SOLN
0.5000 mg | Freq: Once | INTRAMUSCULAR | Status: AC
  Administered 2022-08-03: 0.5 mg via INTRAVENOUS
  Filled 2022-08-03: qty 1

## 2022-08-03 NOTE — ED Triage Notes (Signed)
Patient has had lower right abdominal pain since Tuesday. No vomiting or diarrhea.

## 2022-08-03 NOTE — ED Provider Notes (Signed)
[place] EMERGENCY DEPARTMENT AT [facility] Provider Note  CSN: [id number] Arrival date & time: 08/03/22 1737  Chief Complaint(s) Abdominal Pain  HPI Katie Reed is a 42 y.o. female with prior history of colon cancer in remission presenting to the emergency department with right lower quadrant pain.  She reports that she has had pain for the last 2 days.  She reports pain is constant.  No nausea or vomiting.  She took some naproxen and Robaxin, reports mild improvement.  No fevers or chills.  No urinary symptoms.  No diarrhea.  No syncope or lightheadedness. No vaginal bleeding or discharge   Past Medical History Past Medical History:  Diagnosis Date   Abnormal glucose tolerance test (GTT) during pregnancy, antepartum 12/31/2014   Early 1 hour = 135, passed 3 hr; Third trim= 85 Proven to 9#8oz   Active labor 04/01/2015   AMA (advanced maternal age) multigravida 35+ 12/31/2014   Anxiety    Anxiety    Anxiety and depression    Colon cancer (HCC) dx'd 12/2016   oral chemo comp 2019   Depression    High risk pregnancy due to history of preterm labor 12/31/2014   Delivered at 35 weeks   Obesity in pregnancy 12/31/2014   Preterm labor    Supervision of high-risk pregnancy 12/31/2014    Clinic   High Risk - transfer from [facility] P[facility]atal Labs  Dating  by LMP Blood type: A/Positive/-- (10/20 0000)   Genetic Screen 1 Screen: too late   AFP: too late    Quad:  Too late    NIPS: Antibody:Negative (10/20 0000)  Anatomic US  wnl Rubella: Immune (10/20 0000)  GTT Early:   135  3 hour = 79-150-125-129 (passed)      Third trimester: 85  RPR:     Flu vaccine  declined HBsAg:   ne   Tobacco smoking affecting pregnancy, antepartum 12/31/2014   Patient Active Problem List   Diagnosis Date Noted   Moderate episode of recurrent major depressive disorder (HCC) 11/08/2020   Generalized anxiety disorder 11/08/2020   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 Authorizing Provider  methocarbamol (ROBAXIN) 500 MG tablet Take 1 tablet (500 mg total) by mouth at bedtime as needed for muscle spasms. 07/31/22   Merrilee Jansky, MD  methylPREDNISolone (MEDROL DOSEPAK) 4 MG TBPK tablet Take as directed 05/02/22   Jannifer Franklin, MD  naproxen (NAPROSYN) 375 MG tablet Take 1 tablet (375 mg total) by mouth 2 (two) times daily. 07/31/22   Merrilee Jansky, MD  sertraline (ZOLOFT) 100 MG tablet Take 1 tablet (100 mg total) by mouth at bedtime. 10/21/21   Tollie Eth, NP  Past Surgical History Past Surgical History:  Procedure Laterality Date   FLEXIBLE SIGMOIDOSCOPY N/A 02/19/2017   Procedure: FLEXIBLE SIGMOIDOSCOPY;  Surgeon: Kathi Der, MD;  Location: [facility];  Service: Gastroenterology;  Laterality: N/A;   THERAPEUTIC ABORTION     elective   Family History Family History  Problem Relation Age of Onset   Cancer Mother 103       Colon   Hypertension Mother    Colon cancer Mother    Cancer Sister    Colon cancer Sister 4 - 19   Alcohol abuse Neg Hx    Arthritis Neg Hx    Asthma Neg Hx    Birth defects Neg Hx    COPD Neg Hx    Depression Neg Hx    Diabetes Neg Hx    Drug abuse Neg Hx    Early death Neg Hx    Hearing loss Neg Hx    Heart disease Neg Hx    Hyperlipidemia Neg Hx    Kidney disease Neg Hx    Learning disabilities  Neg Hx    Mental illness Neg Hx    Mental retardation Neg Hx    Miscarriages / Stillbirths Neg Hx    Stroke Neg Hx    Vision loss Neg Hx    Varicose Veins Neg Hx    Esophageal cancer Neg Hx    Rectal cancer Neg Hx    Stomach cancer Neg Hx     Social History Social History   Tobacco Use   Smoking status: Some Days    Packs/day: 0.25    Years: 14.00    Additional pack years: 0.00    Total pack years: 3.50    Types: Cigarettes   Smokeless tobacco: Never  Vaping Use   Vaping Use: Never used  Substance Use Topics   Alcohol use: Yes   Drug use: No   Allergies Patient has no known allergies.  Review of Systems Review of Systems  All other systems reviewed and are negative.   Physical Exam Vital Signs  I have reviewed the triage vital signs BP 126/80   Pulse (!) 56   Temp 98.3 F (36.8 C) (Oral)   Resp 18   Ht 5\' 3"  (1.6 m)   Wt 81.6 kg   LMP 07/15/2022 (Exact Date)   SpO2 100%   BMI 31.89 kg/m  Physical Exam Vitals and nursing note reviewed.  Constitutional:      General: She is not in acute distress.    Appearance: She is well-developed.  HENT:     Head: Normocephalic and atraumatic.     Mouth/Throat:     Mouth: Mucous membranes are moist.  Eyes:     Pupils: Pupils are equal, round, and reactive to light.  Cardiovascular:     Rate and Rhythm: Normal rate and regular rhythm.     Heart sounds: No murmur heard. Pulmonary:     Effort: Pulmonary effort is normal. No respiratory distress.     Breath sounds: Normal breath sounds.  Abdominal:     General: Abdomen is flat.     Palpations: Abdomen is soft.     Tenderness: There is abdominal tenderness in the right lower quadrant.  Musculoskeletal:        General: No tenderness.     Right lower leg: No edema.     Left lower leg: No edema.  Skin:    General: Skin is warm and dry.  Neurological:     General: No  focal deficit present.     Mental Status: She is alert. Mental status is at baseline.   Psychiatric:        Mood and Affect: Mood normal.        Behavior: Behavior normal.     ED Results and Treatments Labs (all labs ordered are listed, but only abnormal results are displayed) Labs Reviewed  COMPREHENSIVE METABOLIC PANEL - Abnormal; Notable for the following components:      Result Value   Glucose, Bld 114 (*)    BUN 21 (*)    Creatinine, Ser 1.04 (*)    All other components within normal limits  CBC - Abnormal; Notable for the following components:   RBC 3.83 (*)    Hemoglobin 11.8 (*)    All other components within normal limits  URINALYSIS, ROUTINE W REFLEX MICROSCOPIC - Abnormal; Notable for the following components:   Hgb urine dipstick SMALL (*)    All other components within normal limits  LIPASE, BLOOD  I-STAT BETA HCG BLOOD, ED ([facility], [facility], [facility] ONLY)                                                                                                                          Radiology CT ABDOMEN PELVIS W CONTRAST  Result Date: 08/03/2022 CLINICAL DATA:  Right lower quadrant pain.  History of colon cancer. EXAM: CT ABDOMEN AND PELVIS WITH CONTRAST TECHNIQUE: Multidetector CT imaging of the abdomen and pelvis was performed using the standard protocol following bolus administration of intravenous contrast. RADIATION DOSE REDUCTION: This exam was performed according to the departmental dose-optimization program which includes automated exposure control, adjustment of the mA and/or kV according to patient size and/or use of iterative reconstruction technique. CONTRAST:  OMNIPAQUE IOHEXOL 300 MG/ML  SOLN COMPARISON:  CT chest abdomen and pelvis 02/07/2022 FINDINGS: Lower chest: No acute abnormality. There is a calcified granuloma in the left lower lobe. Hepatobiliary: No focal liver abnormality is seen. No gallstones, gallbladder wall thickening, or biliary dilatation. Pancreas: Unremarkable. No pancreatic ductal dilatation or surrounding inflammatory changes. Spleen:  Normal in size without focal abnormality. Adrenals/Urinary Tract: Adrenal glands are unremarkable. Kidneys are normal, without renal calculi, focal lesion, or hydronephrosis. Bladder is unremarkable. Stomach/Bowel: Stomach is within normal limits. Appendix appears normal. No evidence of bowel wall thickening, distention, or inflammatory changes. Sigmoid colon anastomosis is present and appears unremarkable. Vascular/Lymphatic: No significant vascular findings are present. No enlarged abdominal or pelvic lymph nodes. Reproductive: There is a small collapsing follicle or cyst in the right ovary. There is a dominant follicle in the left ovary. The uterus is within normal limits. Other: There is trace free fluid in the pelvis. There is no focal abdominal wall hernia. Musculoskeletal: No acute fracture. IMPRESSION: 1. Small collapsing follicle or cyst in the right ovary. 2. Trace free fluid in the pelvis. 3. No other acute localizing process in the abdomen or pelvis. Electronically Signed   By: Mcneil Sober.D.  On: 08/03/2022 22:45    Pertinent labs & imaging results that were available during my care of the patient were reviewed by me and considered in my medical decision making (see MDM for details).  Medications Ordered in ED Medications  HYDROmorphone (DILAUDID) injection 0.5 mg (0.5 mg Intravenous Given 08/03/22 2211)  iohexol (OMNIPAQUE) 300 MG/ML solution 100 mL (100 mLs Intravenous Contrast Given 08/03/22 2231)                                                                                                                                     Procedures Procedures  (including critical care time)  Medical Decision Making / ED Course   MDM:  42 year old female presenting to the emergency department with right lower quadrant pain.  Patient overall well-appearing, vital signs reassuring.  Does have right lower quadrant tenderness on exam.  Suspect likely ruptured right ovarian cyst.  Obtain CT  scan given personal history of malignancy with no evidence of acute process such as appendicitis or recurrence on imaging.  Does show right lower quadrant ovarian cyst.  Less likely torsion with 2 days of symptoms, no vomiting, but will check ultrasound.  Urine pregnancy negative, doubt ectopic pregnancy.  Will reassess.  Clinical Course as of 08/03/22 2315  Thu Aug 03, 2022  2306 Signed out to Dr. Manus Gunning pending US pelvis for ovarian cyst.  Patient feels better after pain medication.  [WS]    Clinical Course User Index [WS] Lonell Grandchild, MD     Additional history obtained:  -External records from outside source obtained and reviewed including: Chart review including previous notes, labs, imaging, consultation notes including previous GI notes related to prior cancer   Lab Tests: -I ordered, reviewed, and interpreted labs.   The pertinent results include:   Labs Reviewed  COMPREHENSIVE METABOLIC PANEL - Abnormal; Notable for the following components:      Result Value   Glucose, Bld 114 (*)    BUN 21 (*)    Creatinine, Ser 1.04 (*)    All other components within normal limits  CBC - Abnormal; Notable for the following components:   RBC 3.83 (*)    Hemoglobin 11.8 (*)    All other components within normal limits  URINALYSIS, ROUTINE W REFLEX MICROSCOPIC - Abnormal; Notable for the following components:   Hgb urine dipstick SMALL (*)    All other components within normal limits  LIPASE, BLOOD  I-STAT BETA HCG BLOOD, ED ([facility], [facility], [facility] ONLY)    Notable for mild AKI  Imaging Studies ordered: I ordered imaging studies including CT abd On my interpretation imaging demonstrates R ovarian cyst I independently visualized and interpreted imaging. I agree with the radiologist interpretation   Medicines ordered and prescription drug management: Meds ordered this encounter  Medications   HYDROmorphone (DILAUDID) injection 0.5 mg   iohexol (OMNIPAQUE) 300 MG/ML solution 100 mL     -I have  reviewed the patients home medicines and have made adjustments as needed  Cardiac Monitoring: The patient was maintained on a cardiac monitor.  I personally viewed and interpreted the cardiac monitored which showed an underlying rhythm of: NSR  Social Determinants of Health:  Diagnosis or treatment significantly limited by social determinants of health: obesity   Reevaluation: After the interventions noted above, I reevaluated the patient and found that their symptoms have improved  Co morbidities that complicate the patient evaluation  Past Medical History:  Diagnosis Date   Abnormal glucose tolerance test (GTT) during pregnancy, antepartum 12/31/2014   Early 1 hour = 135, passed 3 hr; Third trim= 85 Proven to 9#8oz   Active labor 04/01/2015   AMA (advanced maternal age) multigravida 35+ 12/31/2014   Anxiety    Anxiety    Anxiety and depression    Colon cancer (HCC) dx'd 12/2016   oral chemo comp 2019   Depression    High risk pregnancy due to history of preterm labor 12/31/2014   Delivered at 35 weeks   Obesity in pregnancy 12/31/2014   Preterm labor    Supervision of high-risk pregnancy 12/31/2014    Clinic   High Risk - transfer from [facility] Prenatal Labs  Dating  by LMP Blood type: A/Positive/-- (10/20 0000)   Genetic Screen 1 Screen: too late   AFP: too late    Quad:  Too late    NIPS: Antibody:Negative (10/20 0000)  Anatomic US  wnl Rubella: Immune (10/20 0000)  GTT Early:   135  3 hour = 79-150-125-129 (passed)      Third trimester: 85  RPR:     Flu vaccine  declined HBsAg:   ne   Tobacco smoking affecting pregnancy, antepartum 12/31/2014      Dispostion: Disposition decision including need for hospitalization was considered, and patient disposition pending at time of sign out.    Final Clinical Impression(s) / ED Diagnoses Final diagnoses:  RLQ abdominal pain     This chart was dictated using voice recognition software.  Despite best efforts to  proofread,  errors can occur which can change the documentation meaning.    Lonell Grandchild, MD 08/03/22 [phone]

## 2022-08-04 MED ORDER — NAPROXEN 500 MG PO TABS: 500.0000 mg | ORAL_TABLET | Freq: Two times a day (BID) | ORAL | 0 refills | Status: DC | PRN

## 2022-08-04 NOTE — Discharge Instructions (Addendum)
Your testing shows an ovarian cyst.  No evidence of appendicitis and no evidence of blood supply to the ovary.  Take anti-inflammatory as prescribed.  Follow-up with your primary care doctor as well as the gynecologist.  Return to the ED worsening pain, fever, vomiting or any other concerns.

## 2022-08-04 NOTE — ED Notes (Signed)
Discharge instructions reviewed by provider. Pt opted to leave before discharge papers were printed/ready by provider states she has access to them on my chart. Pt ambulatory from ED

## 2022-08-04 NOTE — ED Provider Notes (Signed)
Care assumed from Dr. Suezanne Jacquet.  Patient with right lower quadrant pain for the past 2 days without urinary or vaginal symptoms.  CT scan shows normal appendix but does show small ovarian cyst.  Pending pelvic ultrasound.  Pelvic ultrasound is unremarkable.  No evidence of ovarian torsion.  Urinalysis is negative.  Abdomen is soft without peritoneal signs.  She is stable for follow-up with her OB doctor as well as primary care   Glynn Octave, MD 08/04/22 [phone]

## 2022-08-07 ENCOUNTER — Telehealth: Payer: Self-pay

## 2022-08-07 NOTE — Transitions of Care (Post Inpatient/ED Visit) (Signed)
   08/07/2022  Name: Katie Reed MRN: [id number] DOB: Jun 18, 1980  Today's TOC FU Call Status: Today's TOC FU Call Status:: Successful TOC FU Call Competed TOC FU Call Complete Date: 08/07/22  Transition Care Management Follow-up Telephone Call Date of Discharge: 08/04/22 Discharge Facility: [facility]) Type of Discharge: Emergency Department Reason for ED Visit: Other: How have you been since you were released from the hospital?: Same Any questions or concerns?: No  Items Reviewed: Did you receive and understand the discharge instructions provided?: Yes Medications obtained,verified, and reconciled?: Yes (Medications Reviewed) Any new allergies since your discharge?: No Dietary orders reviewed?: NA Do you have support at home?: Yes  Medications Reviewed Today: Medications Reviewed Today     Reviewed by Blossom Hoops, RMA (Registered Medical Assistant) on 07/31/22 at 1549  Med List Status: <None>   Medication Order Taking? Sig Documenting Provider Last Dose Status Informant  methylPREDNISolone (MEDROL DOSEPAK) 4 MG TBPK tablet [id number]  Take as directed Jannifer Franklin, MD  Active   sertraline (ZOLOFT) 100 MG tablet [id number]  Take 1 tablet (100 mg total) by mouth at bedtime. Tollie Eth, NP  Active   tiZANidine (ZANAFLEX) 4 MG tablet [id number]  Take 1 tablet (4 mg total) by mouth every 6 (six) hours as needed for muscle spasms. Jannifer Franklin, MD  Active             Home Care and Equipment/Supplies: Were Home Health Services Ordered?: No Any new equipment or medical supplies ordered?: No  Functional Questionnaire: Do you need assistance with bathing/showering or dressing?: No Do you need assistance with meal preparation?: No Do you need assistance with eating?: No Do you have difficulty maintaining continence: No Do you need assistance with getting out of bed/getting out of a chair/moving?: No Do you have difficulty managing or taking your medications?:  No  Follow up appointments reviewed: PCP Follow-up appointment confirmed?: Yes Date of PCP follow-up appointment?: 08/17/22 Follow-up Provider: Les Pou Early NP [facility] Follow-up appointment confirmed?: No Do you need transportation to your follow-up appointment?: No Do you understand care options if your condition(s) worsen?: Yes-patient verbalized understanding    SIGNATURE Clyda Hurdle RMA

## 2022-08-17 ENCOUNTER — Encounter: Payer: Self-pay | Admitting: Nurse Practitioner

## 2022-08-17 ENCOUNTER — Ambulatory Visit: Payer: Medicaid Other | Admitting: Nurse Practitioner

## 2022-08-17 VITALS — BP 122/74 | HR 64 | Wt 179.6 lb

## 2022-08-17 DIAGNOSIS — F332 Major depressive disorder, recurrent severe without psychotic features: Secondary | ICD-10-CM

## 2022-08-17 DIAGNOSIS — F331 Major depressive disorder, recurrent, moderate: Secondary | ICD-10-CM | POA: Diagnosis not present

## 2022-08-17 DIAGNOSIS — N83201 Unspecified ovarian cyst, right side: Secondary | ICD-10-CM

## 2022-08-17 DIAGNOSIS — M5459 Other low back pain: Secondary | ICD-10-CM

## 2022-08-17 MED ORDER — METHYLPREDNISOLONE 4 MG PO TBPK: ORAL_TABLET | ORAL | 0 refills | Status: DC

## 2022-08-17 MED ORDER — SERTRALINE HCL 50 MG PO TABS
50.0000 mg | ORAL_TABLET | Freq: Every day | ORAL | 3 refills | Status: AC
Start: 2022-08-17 — End: ?

## 2022-08-17 NOTE — Progress Notes (Signed)
Tollie Eth, DNP, AGNP-c [facility] [address] [place], [place] [address] [phone]   ACUTE VISIT- ESTABLISHED PATIENT  Blood pressure 122/74, pulse 64, weight 179 lb 9.6 oz (81.5 kg), last menstrual period 07/15/2022.  Subjective:  HPI Katie Reed is a 42 y.o. female presents to day for evaluation of: hospital follow-up for ovarian cyst.   ED evaluation showed positive right ovarian cyst without torsion present. She was started on antibiotics and muscle relaxer for management of symptoms. She is still taking these medications and they are helpful for symptoms. Recommendations in ED were for f/u with GYN, she is not established with GYN at this time, but referral is pending.   She also has concerns for exacerbation of chronic lumbar pain for which she was recently seen in the UC for. Robaxin intermittently has not been particularly helpful. She has used a steroid dose pack in the past and this was helpful.   She is in need of refill on her sertraline for mood PMH, Medications, and Allergies reviewed and updated in chart as appropriate.   ROS negative except for what is listed in HPI. Objective:  Physical Exam Vitals and nursing note reviewed.  Constitutional:      Appearance: Normal appearance.  HENT:     Head: Normocephalic.  Eyes:     Pupils: Pupils are equal, round, and reactive to light.  Cardiovascular:     Rate and Rhythm: Normal rate and regular rhythm.     Pulses: Normal pulses.     Heart sounds: Normal heart sounds.  Pulmonary:     Effort: Pulmonary effort is normal.     Breath sounds: Normal breath sounds.  Abdominal:     General: There is no distension.     Tenderness: There is abdominal tenderness. There is no guarding.  Musculoskeletal:        General: Tenderness present. Normal range of motion.     Cervical back: Normal range of motion.     Comments: Sacroiliac tenderness noted.   Skin:    General: Skin is warm.   Neurological:     General: No focal deficit present.     Mental Status: She is alert and oriented to person, place, and time.  Psychiatric:        Mood and Affect: Mood normal.         Assessment & Plan:   Problem List Items Addressed This Visit     Moderate episode of recurrent major depressive disorder (HCC)    Sertraline sent for restart. No alarm symptoms.       Relevant Medications   sertraline (ZOLOFT) 50 MG tablet   Discogenic lumbar pain    Recent evaluation in UC with muscle relaxer prescribed with minimal relief. Will start medrol dose pack and monitor. PT may be beneficial.       Relevant Medications   methylPREDNISolone (MEDROL DOSEPAK) 4 MG TBPK tablet   Cyst of right ovary - Primary    Ovarian cyst present with pending GYN evaluation. No alarm symptoms at this time. We discussed etiology and recommendations. She will f/u if sx worsen or fail to improve.       Other Visit Diagnoses     Severe episode of recurrent major depressive disorder, without psychotic features (HCC)       Relevant Medications   sertraline (ZOLOFT) 50 MG tablet         Time: 21 minutes, >50% spent counseling, care coordination, chart review, and documentation.  Tollie Eth, DNP, AGNP-c   History, Medications, Surgery, SDOH, and Family History reviewed and updated as appropriate.

## 2022-08-17 NOTE — Patient Instructions (Signed)
Ovarian Cyst  An ovarian cyst is a fluid-filled sac on an ovary. Most of these cysts go away on their own and are not cancer. Some cysts need treatment. What are the causes? Ovarian hyperstimulation syndrome. Some medicines may lead to this problem. Polycystic ovarian syndrome (PCOS). Problems with body chemicals (hormones) can lead to this condition. The normal menstrual cycle. What increases the risk? Being overweight or very overweight. Taking medicines to increase your chance of getting pregnant. Using some types of birth control. Smoking. What are the signs or symptoms? Many ovarian cysts do not cause symptoms. If you get symptoms, you may have: Pain or pressure in the area between the hip bones. Pain in the lower belly. Pain during sex. Swelling in the lower belly. Periods that are not regular. Pain with periods. How is this treated? Many ovarian cysts go away on their own without treatment. If you need treatment, it may include: Medicines for pain. Fluid taken out of the cyst. The cyst being taken out. Birth control pills or other medicines. Surgery to remove the ovary. Follow these instructions at home: Take over-the-counter and prescription medicines only as told by your doctor. Ask your doctor if you should avoid driving or using machines while you are taking your medicine. Get exams and Pap tests as told by your doctor. Return to your normal activities when your doctor says that it is safe. Do not smoke or use any products that contain nicotine or tobacco. If you need help quitting, ask your doctor. Keep all follow-up visits. Contact a doctor if: Your periods: Are late. Are not regular. Stop. Are painful. You have pain in the area between your hip bones, and the pain does not go away. You feel pressure on your bladder. You have trouble peeing. You feel full, or your belly hurts, swells, or bloats. You gain or lose weight without trying, or you are less hungry  than normal. You feel pain and pressure in your back. You feel pain and pressure in the area between your hip bones. You think you may be pregnant. Get help right away if: You have pain in your belly that is very bad or gets worse. You have pain in the area between your hip bones, and the pain is very bad or gets worse. You cannot eat or drink without vomiting. You get a fever or chills all of a sudden. Your period is a lot heavier than usual. Summary An ovarian cyst is a fluid-filled sac on an ovary. Some cysts may cause problems and need treatment. Most of these cysts go away on their own. This information is not intended to replace advice given to you by your health care provider. Make sure you discuss any questions you have with your health care provider. Document Revised: 07/18/2019 Document Reviewed: 07/24/2019 Elsevier Patient Education  2024 [employer].

## 2022-09-25 ENCOUNTER — Encounter: Payer: Self-pay | Admitting: Nurse Practitioner

## 2022-09-25 NOTE — Assessment & Plan Note (Signed)
Ovarian cyst present with pending GYN evaluation. No alarm symptoms at this time. We discussed etiology and recommendations. She will f/u if sx worsen or fail to improve.

## 2022-09-25 NOTE — Assessment & Plan Note (Signed)
Sertraline sent for restart. No alarm symptoms.

## 2022-09-25 NOTE — Assessment & Plan Note (Signed)
Recent evaluation in UC with muscle relaxer prescribed with minimal relief. Will start medrol dose pack and monitor. PT may be beneficial.

## 2022-11-07 ENCOUNTER — Telehealth: Payer: Self-pay | Admitting: Nurse Practitioner

## 2022-11-07 NOTE — Telephone Encounter (Signed)
Katie Reed called and wanted to know if you wanted her to come back while she was on her period or while she was ovulating?

## 2022-11-08 ENCOUNTER — Ambulatory Visit: Payer: Medicaid Other | Admitting: Nurse Practitioner

## 2022-11-08 ENCOUNTER — Encounter: Payer: Self-pay | Admitting: Nurse Practitioner

## 2022-11-08 VITALS — BP 116/80 | HR 70 | Ht 63.25 in | Wt 186.4 lb

## 2022-11-08 DIAGNOSIS — R5382 Chronic fatigue, unspecified: Secondary | ICD-10-CM

## 2022-11-08 DIAGNOSIS — F411 Generalized anxiety disorder: Secondary | ICD-10-CM

## 2022-11-08 DIAGNOSIS — R7989 Other specified abnormal findings of blood chemistry: Secondary | ICD-10-CM

## 2022-11-08 DIAGNOSIS — G8929 Other chronic pain: Secondary | ICD-10-CM | POA: Diagnosis not present

## 2022-11-08 DIAGNOSIS — M533 Sacrococcygeal disorders, not elsewhere classified: Secondary | ICD-10-CM

## 2022-11-08 DIAGNOSIS — Z23 Encounter for immunization: Secondary | ICD-10-CM | POA: Diagnosis not present

## 2022-11-08 DIAGNOSIS — E559 Vitamin D deficiency, unspecified: Secondary | ICD-10-CM

## 2022-11-08 DIAGNOSIS — C187 Malignant neoplasm of sigmoid colon: Secondary | ICD-10-CM

## 2022-11-08 DIAGNOSIS — M5020 Other cervical disc displacement, unspecified cervical region: Secondary | ICD-10-CM

## 2022-11-08 DIAGNOSIS — Z0000 Encounter for general adult medical examination without abnormal findings: Secondary | ICD-10-CM

## 2022-11-08 DIAGNOSIS — M5459 Other low back pain: Secondary | ICD-10-CM

## 2022-11-08 DIAGNOSIS — R7303 Prediabetes: Secondary | ICD-10-CM

## 2022-11-08 DIAGNOSIS — E782 Mixed hyperlipidemia: Secondary | ICD-10-CM

## 2022-11-08 DIAGNOSIS — F331 Major depressive disorder, recurrent, moderate: Secondary | ICD-10-CM

## 2022-11-08 MED ORDER — METHYLPREDNISOLONE ACETATE 40 MG/ML IJ SUSP
40.0000 mg | Freq: Once | INTRAMUSCULAR | Status: AC
Start: 2022-11-08 — End: 2022-11-08
  Administered 2022-11-08: 40 mg via INTRAMUSCULAR

## 2022-11-08 NOTE — Patient Instructions (Signed)

## 2022-11-08 NOTE — Progress Notes (Signed)
Katie Clamp, DNP, AGNP-c [facility] [address] [place], [place] [address] Main Office [phone]  BP 116/80   Pulse 70   Ht 5' 3.25" (1.607 m)   Wt 186 lb 6.4 oz (84.6 kg)   LMP 10/28/2022   BMI 32.76 kg/m    Subjective:    Patient ID: Katie Reed, female    DOB: [birth year], 42 y.o.   MRN: [id number]  HPI: Katie Reed is a 42 y.o. female presenting on 11/08/2022 for comprehensive medical examination.   Current medical concerns include:none   A comprehensive review of systems was negative.  IMMUNIZATIONS:   Flu Vaccine: Flu vaccine declined, patient will complete later Prevnar 13: Prevnar 13 N/A for this patient Prevnar 20: Prevnar 20 N/A for this patient Pneumovax 23: Pneumovax 23 N/A for this patient Vac Shingrix: Shingrix N/A for this patient HPV: N/A or Aged Out Tetanus: Tetanus due, given today  COVID: Declined today. Information on where to obtain the vaccine was provided.  RSV: No  HEALTH MAINTENANCE: Pap Smear HM Status: is up to date and need records Mammogram HM Status: is up to date and need records Colon Cancer Screening HM Status: is up to date Bone Density HM Status: N/A STI Testing HM Status: was declined  Lung CT HM Status: N/A  Concerns with vision, hearing, or dentition: No  Dietary Habits: Eats a healthy diet for the most part.  Exercise: Activity with the children. Fatigue is a limiting factor.   Most Recent Depression Screen:     11/08/2022   11:13 AM 03/01/2022   10:22 AM 10/07/2020    2:34 PM 01/02/2020   11:03 AM 05/01/2017    4:13 PM  Depression screen PHQ 2/9  Decreased Interest 0 0 2 1   Down, Depressed, Hopeless 0 1 1 1 2   PHQ - 2 Score 0 1 3 2 2   Altered sleeping  0 2 2 0  Tired, decreased energy  1 2 3 2   Change in appetite  1 2 2 2   Feeling bad or failure about yourself   1 1 0 1  Trouble concentrating  0 1 0 0  Moving slowly or fidgety/restless  0 0 2 0  Suicidal thoughts  0 0 0 0  PHQ-9  Score  4 11 11 7   Difficult doing work/chores  Not difficult at all Extremely dIfficult     Most Recent Anxiety Screen:     10/07/2020    2:37 PM 01/02/2020   11:04 AM 05/01/2017    4:14 PM  GAD 7 : Generalized Anxiety Score  Nervous, Anxious, on Edge 2  0  Control/stop worrying 3 2 0  Worry too much - different things 1 1 2   Trouble relaxing 3 3 2   Restless 2 3 2   Easily annoyed or irritable 2  1  Afraid - awful might happen 1 1 3   Total GAD 7 Score 14  10  Anxiety Difficulty Extremely difficult     Most Recent Fall Screen:    11/08/2022   11:13 AM 10/07/2020    2:34 PM 12/23/2014    3:28 PM  Fall Risk   Falls in the past year? 0 0 No  Number falls in past yr: 0 0   Injury with Fall? 0 0   Risk for fall due to : No Fall Risks No Fall Risks   Follow up Falls evaluation completed Falls evaluation completed     Past medical history, surgical history, medications, allergies,  family history and social history reviewed with patient today and changes made to appropriate areas of the chart.  Past Medical History:  Past Medical History:  Diagnosis Date   Abnormal glucose tolerance test (GTT) during pregnancy, antepartum 12/31/2014   Shantel Wesely 1 hour = 135, passed 3 hr; Third trim= 85 Proven to 9#8oz   Active labor 04/01/2015   AMA (advanced maternal age) multigravida 35+ 12/31/2014   Anxiety    Anxiety    Anxiety and depression    Colon cancer (HCC) dx'd 12/2016   oral chemo comp 2019   Colonic mass 07/25/2017   Depression    Encounter to establish care with new doctor 10/07/2020   High risk pregnancy due to history of preterm labor 12/31/2014   Delivered at 35 weeks   Intussusception intestine (HCC) 02/19/2017   Muscle spasms of neck 10/07/2020   Obesity in pregnancy 12/31/2014   Preterm labor    Psychophysiological insomnia 10/07/2020   Right arm numbness 10/07/2020   Supervision of high-risk pregnancy 12/31/2014    Clinic   High Risk - transfer from [facility]  Prenatal Labs  Dating  by LMP Blood type: A/Positive/-- (10/20 0000)   Genetic Screen 1 Screen: too late   AFP: too late    Quad:  Too late    NIPS: Antibody:Negative (10/20 0000)  Anatomic US  wnl Rubella: Immune (10/20 0000)  GTT Reegan Mctighe:   135  3 hour = 79-150-125-129 (passed)      Third trimester: 85  RPR:     Flu vaccine  declined HBsAg:   ne   Tobacco smoking affecting pregnancy, antepartum 12/31/2014   Weakness of right arm 10/07/2020   Medications:  Current Outpatient Medications on File Prior to Visit  Medication Sig   methocarbamol (ROBAXIN) 500 MG tablet Take 1 tablet (500 mg total) by mouth at bedtime as needed for muscle spasms.   naproxen (NAPROSYN) 500 MG tablet Take 1 tablet (500 mg total) by mouth 2 (two) times daily as needed.   sertraline (ZOLOFT) 50 MG tablet Take 1 tablet (50 mg total) by mouth at bedtime.   No current facility-administered medications on file prior to visit.   Surgical History:  Past Surgical History:  Procedure Laterality Date   FLEXIBLE SIGMOIDOSCOPY N/A 02/19/2017   Procedure: FLEXIBLE SIGMOIDOSCOPY;  Surgeon: Kathi Der, MD;  Location: [facility];  Service: Gastroenterology;  Laterality: N/A;   THERAPEUTIC ABORTION     elective   Allergies:  No Known Allergies Family History:  Family History  Problem Relation Age of Onset   Cancer Mother 45       Colon   Hypertension Mother    Colon cancer Mother    Cancer Sister    Colon cancer Sister 24 - 78   Alcohol abuse Neg Hx    Arthritis Neg Hx    Asthma Neg Hx    Birth defects Neg Hx    COPD Neg Hx    Depression Neg Hx    Diabetes Neg Hx    Drug abuse Neg Hx    Navaeh Kehres death Neg Hx    Hearing loss Neg Hx    Heart disease Neg Hx    Hyperlipidemia Neg Hx    Kidney disease Neg Hx    Learning disabilities Neg Hx    Mental illness Neg Hx    Mental retardation Neg Hx    Miscarriages / Stillbirths Neg Hx    Stroke Neg Hx    Vision loss Neg Hx  Varicose Veins Neg Hx    Esophageal  cancer Neg Hx    Rectal cancer Neg Hx    Stomach cancer Neg Hx        Objective:    BP 116/80   Pulse 70   Ht 5' 3.25" (1.607 m)   Wt 186 lb 6.4 oz (84.6 kg)   LMP 10/28/2022   BMI 32.76 kg/m   Wt Readings from Last 3 Encounters:  11/08/22 186 lb 6.4 oz (84.6 kg)  08/17/22 179 lb 9.6 oz (81.5 kg)  08/03/22 180 lb (81.6 kg)    Physical Exam Vitals and nursing note reviewed.  Constitutional:      General: She is not in acute distress.    Appearance: Normal appearance.  HENT:     Head: Normocephalic and atraumatic.     Right Ear: Hearing, tympanic membrane, ear canal and external ear normal.     Left Ear: Hearing, tympanic membrane, ear canal and external ear normal.     Nose: Nose normal.     Right Sinus: No maxillary sinus tenderness or frontal sinus tenderness.     Left Sinus: No maxillary sinus tenderness or frontal sinus tenderness.     Mouth/Throat:     Lips: Pink.     Mouth: Mucous membranes are moist.     Pharynx: Oropharynx is clear.  Eyes:     General: Lids are normal. Vision grossly intact.     Extraocular Movements: Extraocular movements intact.     Conjunctiva/sclera: Conjunctivae normal.     Pupils: Pupils are equal, round, and reactive to light.     Funduscopic exam:    Right eye: Red reflex present.        Left eye: Red reflex present.    Visual Fields: Right eye visual fields normal and left eye visual fields normal.  Neck:     Thyroid: No thyromegaly.     Vascular: No carotid bruit.  Cardiovascular:     Rate and Rhythm: Normal rate and regular rhythm.     Chest Wall: PMI is not displaced.     Pulses: Normal pulses.          Dorsalis pedis pulses are 2+ on the right side and 2+ on the left side.       Posterior tibial pulses are 2+ on the right side and 2+ on the left side.     Heart sounds: Normal heart sounds. No murmur heard. Pulmonary:     Effort: Pulmonary effort is normal. No respiratory distress.     Breath sounds: Normal breath sounds.   Abdominal:     General: Abdomen is flat. Bowel sounds are normal. There is no distension.     Palpations: Abdomen is soft. There is no hepatomegaly, splenomegaly or mass.     Tenderness: There is no abdominal tenderness. There is no right CVA tenderness, left CVA tenderness, guarding or rebound.  Musculoskeletal:        General: Tenderness present. Normal range of motion.     Cervical back: Full passive range of motion without pain, normal range of motion and neck supple. No tenderness.     Right lower leg: No edema.     Left lower leg: No edema.     Comments: SI joint bilaterally notes tenderness with movement and palpation. ROM intact.   Feet:     Left foot:     Toenail Condition: Left toenails are normal.  Lymphadenopathy:     Cervical: No cervical adenopathy.  Upper Body:     Right upper body: No supraclavicular adenopathy.     Left upper body: No supraclavicular adenopathy.  Skin:    General: Skin is warm and dry.     Capillary Refill: Capillary refill takes less than 2 seconds.     Nails: There is no clubbing.  Neurological:     General: No focal deficit present.     Mental Status: She is alert and oriented to person, place, and time.     GCS: GCS eye subscore is 4. GCS verbal subscore is 5. GCS motor subscore is 6.     Sensory: Sensation is intact.     Motor: Motor function is intact.     Coordination: Coordination is intact.     Gait: Gait is intact.     Deep Tendon Reflexes: Reflexes are normal and symmetric.  Psychiatric:        Attention and Perception: Attention normal.        Mood and Affect: Mood normal.        Speech: Speech normal.        Behavior: Behavior normal. Behavior is cooperative.        Thought Content: Thought content normal.        Cognition and Memory: Cognition and memory normal.        Judgment: Judgment normal.     Results for orders placed or performed in visit on 11/08/22  Vitamin B12  Result Value Ref Range   Vitamin B-12 986 232 -  1,245 pg/mL  CMP14+EGFR  Result Value Ref Range   Glucose 87 70 - 99 mg/dL   BUN 11 6 - 24 mg/dL   Creatinine, Ser 1.61 0.57 - 1.00 mg/dL   eGFR 096 >04 VW/UJW/1.19   BUN/Creatinine Ratio 15 9 - 23   Sodium 140 134 - 144 mmol/L   Potassium 4.3 3.5 - 5.2 mmol/L   Chloride 103 96 - 106 mmol/L   CO2 25 20 - 29 mmol/L   Calcium 9.4 8.7 - 10.2 mg/dL   Total Protein 6.7 6.0 - 8.5 g/dL   Albumin 4.2 3.9 - 4.9 g/dL   Globulin, Total 2.5 1.5 - 4.5 g/dL   Bilirubin Total 0.4 0.0 - 1.2 mg/dL   Alkaline Phosphatase 42 (L) 44 - 121 IU/L   AST 18 0 - 40 IU/L   ALT 26 0 - 32 IU/L  CBC with Differential/Platelet  Result Value Ref Range   WBC 4.5 3.4 - 10.8 x10E3/uL   RBC 3.95 3.77 - 5.28 x10E6/uL   Hemoglobin 12.0 11.1 - 15.9 g/dL   Hematocrit 14.7 82.9 - 46.6 %   MCV 94 79 - 97 fL   MCH 30.4 26.6 - 33.0 pg   MCHC 32.3 31.5 - 35.7 g/dL   RDW 56.2 13.0 - 86.5 %   Platelets 328 150 - 450 x10E3/uL   Neutrophils 46 Not Estab. %   Lymphs 44 Not Estab. %   Monocytes 7 Not Estab. %   Eos 2 Not Estab. %   Basos 1 Not Estab. %   Neutrophils Absolute 2.1 1.4 - 7.0 x10E3/uL   Lymphocytes Absolute 2.0 0.7 - 3.1 x10E3/uL   Monocytes Absolute 0.3 0.1 - 0.9 x10E3/uL   EOS (ABSOLUTE) 0.1 0.0 - 0.4 x10E3/uL   Basophils Absolute 0.1 0.0 - 0.2 x10E3/uL   Immature Granulocytes 0 Not Estab. %   Immature Grans (Abs) 0.0 0.0 - 0.1 x10E3/uL  Lipid panel  Result Value Ref Range  Cholesterol, Total 219 (H) 100 - 199 mg/dL   Triglycerides 161 0 - 149 mg/dL   HDL 68 >09 mg/dL   VLDL Cholesterol Cal 19 5 - 40 mg/dL   LDL Chol Calc (NIH) 604 (H) 0 - 99 mg/dL   Chol/HDL Ratio 3.2 0.0 - 4.4 ratio  Hemoglobin A1c  Result Value Ref Range   Hgb A1c MFr Bld 5.7 (H) 4.8 - 5.6 %   Est. average glucose Bld gHb Est-mCnc 117 mg/dL  TSH  Result Value Ref Range   TSH 0.217 (L) 0.450 - 4.500 uIU/mL  VITAMIN D 25 Hydroxy (Vit-D Deficiency, Fractures)  Result Value Ref Range   Vit D, 25-Hydroxy 30.7 30.0 - 100.0  ng/mL  T4, free  Result Value Ref Range   Free T4 1.10 0.82 - 1.77 ng/dL  Thyroid antibodies  Result Value Ref Range   Thyroperoxidase Ab SerPl-aCnc 18 0 - 34 IU/mL   Thyroglobulin Antibody <1.0 0.0 - 0.9 IU/mL  Specimen status report  Result Value Ref Range   specimen status report Comment          Assessment & Plan:   Problem List Items Addressed This Visit     Encounter for annual physical exam - Primary    CPE completed today. Review of HM activities and recommendations discussed and provided on AVS. Anticipatory guidance, diet, and exercise recommendations provided. Medications, allergies, and hx reviewed and updated as necessary. Orders placed as listed below.  Plan: - Labs ordered. Will make changes as necessary based on results.  - I will review these results and send recommendations via MyChart or a telephone call.  - F/U with CPE in 1 year or sooner for acute/chronic health needs as directed.        Relevant Orders   CBC with Differential/Platelet   CMP14+EGFR   Hemoglobin A1c   Lipid panel   TSH   Ambulatory referral to Spine Surgery   VITAMIN D 25 Hydroxy (Vit-D Deficiency, Fractures)   Moderate episode of recurrent major depressive disorder (HCC)    Chronic. Well controlled at this time with Sertraline 50mg  daily. No alarm symptoms are present at this time.  Plan: - Continue to take the medication for your mood, even though your mood has improved. Stopping the medication may cause rebound of symptoms.  - If you wish to trial off of the medication, please let me know and we can set up an appointment to discuss tapering and trial.       Generalized anxiety disorder    Chronic. Well controlled on sertraline 50mg  daily. No alarm symptoms.  Plan: - Continue medication      Relevant Orders   CBC with Differential/Platelet   CMP14+EGFR   Hemoglobin A1c   Lipid panel   TSH   Ambulatory referral to Spine Surgery   VITAMIN D 25 Hydroxy (Vit-D Deficiency,  Fractures)   Discogenic lumbar pain    Lumbar pain chronic in nature. This appears to be coupled with SI joint pain. We discussed options that may be helpful. She has not had success with robaxin, NSAIDs, and at home stretching exercises. Her colon cancer is not currently active, therefore I do not feel this is playing  role in the pain.  Given that she has not had success with conservative measures, we will send a referral to the spinal specialist for further evaluation and recommendations.  Plan: - I have sent a referral to the spinal specialist for you. Someone from that office will call  to set up an appointment.  - Continue with gentle stretching and the NSAIDs to help with pain.       Relevant Orders   CBC with Differential/Platelet   CMP14+EGFR   Hemoglobin A1c   Lipid panel   TSH   Ambulatory referral to Spine Surgery   VITAMIN D 25 Hydroxy (Vit-D Deficiency, Fractures)   Cervical disc displacement    Chronic. Referral to spinal specialist.      Relevant Orders   CBC with Differential/Platelet   CMP14+EGFR   Hemoglobin A1c   Lipid panel   TSH   Ambulatory referral to Spine Surgery   VITAMIN D 25 Hydroxy (Vit-D Deficiency, Fractures)   Pre-diabetes    Chronic. Currently managed with diet and exercise. Exercise limited by her neck and low back pain. We will monitor labs today. No changes at this time.       Moderate mixed hyperlipidemia not requiring statin therapy    Chronic. Currently managed with diet and exercise. Will monitor labs today. The 10-year ASCVD risk score (Arnett DK, et al., 2019) is: 0.7%   Values used to calculate the score:     Age: 26 years     Sex: Female     Is Non-Hispanic African American: Yes     Diabetic: No     Tobacco smoker: Yes     Systolic Blood Pressure: 116 mmHg     Is BP treated: No     HDL Cholesterol: 68 mg/dL     Total Cholesterol: 219 mg/dL       Chronic SI joint pain    Chronic. She has not had success with conservative  treatment measures. I do feel that further evaluation with spinal specialist is warranted at this time to discuss further options. She does have lumbar pain associated, which may be making the pain worse. We discussed possible joint injections today, which she is open to discussing with the specialist.  Plan: - I have sent a referral to the spinal specialist. That office will call to set up an appointment for your neck and low back.       Relevant Orders   CBC with Differential/Platelet   CMP14+EGFR   Hemoglobin A1c   Lipid panel   TSH   Ambulatory referral to Spine Surgery   VITAMIN D 25 Hydroxy (Vit-D Deficiency, Fractures)   Other Visit Diagnoses     Need for Tdap vaccination       Chronic fatigue       Relevant Orders   Vitamin B12 (Completed)   Vitamin D deficiency       Relevant Medications   Vitamin D, Ergocalciferol, (DRISDOL) 1.25 MG (50000 UNIT) CAPS capsule          Follow up plan: Return in about 1 year (around 11/08/2023) for CPE.  NEXT PREVENTATIVE PHYSICAL DUE IN 1 YEAR.  PATIENT COUNSELING PROVIDED FOR ALL ADULT PATIENTS: A well balanced diet low in saturated fats, cholesterol, and moderation in carbohydrates.  This can be as simple as monitoring portion sizes and cutting back on sugary beverages such as soda and juice to start with.    Daily water consumption of at least 64 ounces.  Physical activity at least 180 minutes per week.  If just starting out, start 10 minutes a day and work your way up.   This can be as simple as taking the stairs instead of the elevator and walking 2-3 laps around the office  purposefully every day.   STD  protection, partner selection, and regular testing if high risk.  Limited consumption of alcoholic beverages if alcohol is consumed. For men, I recommend no more than 14 alcoholic beverages per week, spread out throughout the week (max 2 per day). Avoid "binge" drinking or consuming large quantities of alcohol in one  setting.  Please let me know if you feel you may need help with reduction or quitting alcohol consumption.   Avoidance of nicotine, if used. Please let me know if you feel you may need help with reduction or quitting nicotine use.   Daily mental health attention. This can be in the form of 5 minute daily meditation, prayer, journaling, yoga, reflection, etc.  Purposeful attention to your emotions and mental state can significantly improve your overall wellbeing  and  Health.  Please know that I am here to help you with all of your health care goals and am happy to work with you to find a solution that works best for you.  The greatest advice I have received with any changes in life are to take it one step at a time, that even means if all you can focus on is the next 60 seconds, then do that and celebrate your victories.  With any changes in life, you will have set backs, and that is OK. The important thing to remember is, if you have a set back, it is not a failure, it is an opportunity to try again! Screening Testing Mammogram Every 1 -2 years based on history and risk factors Starting at age 46 Pap Smear Ages 21-39 every 3 years Ages 7-65 every 5 years with HPV testing More frequent testing may be required based on results and history Colon Cancer Screening Every 1-10 years based on test performed, risk factors, and history Starting at age 42 Bone Density Screening Every 2-10 years based on history Starting at age 13 for women Recommendations for men differ based on medication usage, history, and risk factors AAA Screening One time ultrasound Men 68-28 years old who have every smoked Lung Cancer Screening Low Dose Lung CT every 12 months Age 31-80 years with a 30 pack-year smoking history who still smoke or who have quit within the last 15 years   Screening Labs Routine  Labs: Complete Blood Count (CBC), Complete Metabolic Panel (CMP), Cholesterol (Lipid Panel) Every 6-12  months based on history and medications May be recommended more frequently based on current conditions or previous results Hemoglobin A1c Lab Every 3-12 months based on history and previous results Starting at age 22 or earlier with diagnosis of diabetes, high cholesterol, BMI >26, and/or risk factors Frequent monitoring for patients with diabetes to ensure blood sugar control Thyroid Panel (TSH) Every 6 months based on history, symptoms, and risk factors May be repeated more often if on medication HIV One time testing for all patients 36 and older May be repeated more frequently for patients with increased risk factors or exposure Hepatitis C One time testing for all patients 42 and older May be repeated more frequently for patients with increased risk factors or exposure Gonorrhea, Chlamydia Every 12 months for all sexually active persons 13-24 years Additional monitoring may be recommended for those who are considered high risk or who have symptoms Every 12 months for any woman on birth control, regardless of sexual activity PSA Men 64-70 years old with risk factors Additional screening may be recommended from age 52-69 based on risk factors, symptoms, and history  Vaccine Recommendations Tetanus Booster All adults  every 10 years Flu Vaccine All patients 6 months and older every year COVID Vaccine All patients 12 years and older Initial dosing with booster May recommend additional booster based on age and health history HPV Vaccine 2 doses all patients age 71-26 Dosing may be considered for patients over 26 Shingles Vaccine (Shingrix) 2 doses all adults 55 years and older Pneumonia (Pneumovax 23) All adults 65 years and older May recommend earlier dosing based on health history One year apart from Prevnar 13 Pneumonia (Prevnar 60) All adults 65 years and older Dosed 1 year after Pneumovax 23 Pneumonia (Prevnar 20) One time alternative to the two dosing of 13 and 23 For  all adults with initial dose of 23, 20 is recommended 1 year later For all adults with initial dose of 13, 23 is still recommended as second option 1 year later

## 2022-11-09 LAB — CMP14+EGFR
ALT: 26 IU/L (ref 0–32)
AST: 18 IU/L (ref 0–40)
Albumin: 4.2 g/dL (ref 3.9–4.9)
Alkaline Phosphatase: 42 IU/L — ABNORMAL LOW (ref 44–121)
BUN/Creatinine Ratio: 15 (ref 9–23)
BUN: 11 mg/dL (ref 6–24)
Bilirubin Total: 0.4 mg/dL (ref 0.0–1.2)
CO2: 25 mmol/L (ref 20–29)
Calcium: 9.4 mg/dL (ref 8.7–10.2)
Chloride: 103 mmol/L (ref 96–106)
Creatinine, Ser: 0.75 mg/dL (ref 0.57–1.00)
Globulin, Total: 2.5 g/dL (ref 1.5–4.5)
Glucose: 87 mg/dL (ref 70–99)
Potassium: 4.3 mmol/L (ref 3.5–5.2)
Sodium: 140 mmol/L (ref 134–144)
Total Protein: 6.7 g/dL (ref 6.0–8.5)
eGFR: 101 mL/min/{1.73_m2} (ref >59)

## 2022-11-09 LAB — VITAMIN B12: Vitamin B-12: 986 pg/mL (ref 232–1245)

## 2022-11-09 LAB — CBC WITH DIFFERENTIAL/PLATELET
Basophils Absolute: 0.1 10*3/uL (ref 0.0–0.2)
Basos: 1 %
EOS (ABSOLUTE): 0.1 10*3/uL (ref 0.0–0.4)
Eos: 2 %
Hematocrit: 37.1 % (ref 34.0–46.6)
Hemoglobin: 12 g/dL (ref 11.1–15.9)
Immature Grans (Abs): 0 10*3/uL (ref 0.0–0.1)
Immature Granulocytes: 0 %
Lymphocytes Absolute: 2 10*3/uL (ref 0.7–3.1)
Lymphs: 44 %
MCH: 30.4 pg (ref 26.6–33.0)
MCHC: 32.3 g/dL (ref 31.5–35.7)
MCV: 94 fL (ref 79–97)
Monocytes Absolute: 0.3 10*3/uL (ref 0.1–0.9)
Monocytes: 7 %
Neutrophils Absolute: 2.1 10*3/uL (ref 1.4–7.0)
Neutrophils: 46 %
Platelets: 328 10*3/uL (ref 150–450)
RBC: 3.95 x10E6/uL (ref 3.77–5.28)
RDW: 12.5 % (ref 11.7–15.4)
WBC: 4.5 10*3/uL (ref 3.4–10.8)

## 2022-11-09 LAB — HEMOGLOBIN A1C
Est. average glucose Bld gHb Est-mCnc: 117 mg/dL
Hgb A1c MFr Bld: 5.7 % — ABNORMAL HIGH (ref 4.8–5.6)

## 2022-11-09 LAB — VITAMIN D 25 HYDROXY (VIT D DEFICIENCY, FRACTURES): Vit D, 25-Hydroxy: 30.7 ng/mL (ref 30.0–100.0)

## 2022-11-09 LAB — LIPID PANEL
Chol/HDL Ratio: 3.2 ratio (ref 0.0–4.4)
Cholesterol, Total: 219 mg/dL — ABNORMAL HIGH (ref 100–199)
HDL: 68 mg/dL (ref >39)
LDL Chol Calc (NIH): 132 mg/dL — ABNORMAL HIGH (ref 0–99)
Triglycerides: 106 mg/dL (ref 0–149)
VLDL Cholesterol Cal: 19 mg/dL (ref 5–40)

## 2022-11-09 LAB — TSH: TSH: 0.217 u[IU]/mL — ABNORMAL LOW (ref 0.450–4.500)

## 2022-11-10 ENCOUNTER — Other Ambulatory Visit: Payer: Self-pay | Admitting: Nurse Practitioner

## 2022-11-10 DIAGNOSIS — R7989 Other specified abnormal findings of blood chemistry: Secondary | ICD-10-CM | POA: Insufficient documentation

## 2022-11-10 DIAGNOSIS — R7303 Prediabetes: Secondary | ICD-10-CM | POA: Insufficient documentation

## 2022-11-10 DIAGNOSIS — E782 Mixed hyperlipidemia: Secondary | ICD-10-CM | POA: Insufficient documentation

## 2022-11-10 MED ORDER — VITAMIN D (ERGOCALCIFEROL) 1.25 MG (50000 UNIT) PO CAPS
50000.0000 [IU] | ORAL_CAPSULE | ORAL | 3 refills | Status: DC
Start: 2022-11-10 — End: 2023-12-12

## 2022-11-11 LAB — THYROID ANTIBODIES (THYROPEROXIDASE & THYROGLOBULIN)
Thyroglobulin Antibody: <1 [IU]/mL (ref 0.0–0.9)
Thyroperoxidase Ab SerPl-aCnc: 18 [IU]/mL (ref 0–34)

## 2022-11-11 LAB — SPECIMEN STATUS REPORT

## 2022-11-11 LAB — T4, FREE: Free T4: 1.1 ng/dL (ref 0.82–1.77)

## 2022-11-13 ENCOUNTER — Encounter: Payer: Self-pay | Admitting: Nurse Practitioner

## 2022-11-13 DIAGNOSIS — G8929 Other chronic pain: Secondary | ICD-10-CM | POA: Insufficient documentation

## 2022-11-13 NOTE — Assessment & Plan Note (Signed)
Chronic. Well controlled at this time with Sertraline 50mg  daily. No alarm symptoms are present at this time.  Plan: - Continue to take the medication for your mood, even though your mood has improved. Stopping the medication may cause rebound of symptoms.  - If you wish to trial off of the medication, please let me know and we can set up an appointment to discuss tapering and trial.

## 2022-11-13 NOTE — Assessment & Plan Note (Signed)
Lumbar pain chronic in nature. This appears to be coupled with SI joint pain. We discussed options that may be helpful. She has not had success with robaxin, NSAIDs, and at home stretching exercises. Her colon cancer is not currently active, therefore I do not feel this is playing  role in the pain.  Given that she has not had success with conservative measures, we will send a referral to the spinal specialist for further evaluation and recommendations.  Plan: - I have sent a referral to the spinal specialist for you. Someone from that office will call to set up an appointment.  - Continue with gentle stretching and the NSAIDs to help with pain.

## 2022-11-13 NOTE — Assessment & Plan Note (Signed)
Chronic. Well controlled on sertraline 50mg  daily. No alarm symptoms.  Plan: - Continue medication

## 2022-11-13 NOTE — Assessment & Plan Note (Signed)

## 2022-11-13 NOTE — Assessment & Plan Note (Signed)
Chronic. Currently managed with diet and exercise. Will monitor labs today. The 10-year ASCVD risk score (Arnett DK, et al., 2019) is: 0.7%   Values used to calculate the score:     Age: 42 years     Sex: Female     Is Non-Hispanic African American: Yes     Diabetic: No     Tobacco smoker: Yes     Systolic Blood Pressure: 116 mmHg     Is BP treated: No     HDL Cholesterol: 68 mg/dL     Total Cholesterol: 219 mg/dL

## 2022-11-13 NOTE — Assessment & Plan Note (Signed)
Chronic. Currently managed with diet and exercise. Exercise limited by her neck and low back pain. We will monitor labs today. No changes at this time.

## 2022-11-13 NOTE — Assessment & Plan Note (Signed)
Chronic. Referral to spinal specialist.

## 2022-11-13 NOTE — Assessment & Plan Note (Addendum)
Chronic. She has not had success with conservative treatment measures. We will trial an IM steroid injection today to see if this is helpful in reducing the pain and inflammation present to provide some relief.  I do feel that further evaluation with spinal specialist is warranted at this time to discuss further options. She does have lumbar pain associated, which may be making the pain worse. We discussed possible joint injections today, which she is open to discussing with the specialist.  Plan: - I have sent a referral to the spinal specialist. That office will call to set up an appointment for your neck and low back.  - You received a steroid shot today to help reduce inflammation and help with the pain.

## 2022-11-14 ENCOUNTER — Telehealth: Payer: Self-pay | Admitting: *Deleted

## 2022-11-14 NOTE — Telephone Encounter (Signed)
Patient left Mychart message requesting an appointment with Dr. Truett Perna on 9/19 at 10 am.  Called and left VM to please inform office if she is having any issues regarding her H/O colon cancer or if this is to get re-established since her last visit was 09/07/21?Marland Kitchen MD schedule is full until October if it is for routine f/u.

## 2022-12-01 ENCOUNTER — Ambulatory Visit: Payer: Medicaid Other

## 2022-12-08 ENCOUNTER — Ambulatory Visit: Payer: Medicaid Other | Admitting: Physical Therapy

## 2023-01-08 ENCOUNTER — Other Ambulatory Visit: Payer: Self-pay | Admitting: *Deleted

## 2023-01-08 DIAGNOSIS — C187 Malignant neoplasm of sigmoid colon: Secondary | ICD-10-CM

## 2023-01-16 ENCOUNTER — Inpatient Hospital Stay: Payer: Medicaid Other | Attending: Nurse Practitioner

## 2023-01-16 ENCOUNTER — Inpatient Hospital Stay: Payer: Medicaid Other | Admitting: Oncology

## 2023-02-01 ENCOUNTER — Encounter: Payer: Self-pay | Admitting: Obstetrics and Gynecology

## 2023-02-16 ENCOUNTER — Inpatient Hospital Stay: Payer: Medicaid Other | Attending: Nurse Practitioner

## 2023-02-16 ENCOUNTER — Inpatient Hospital Stay: Payer: Medicaid Other | Admitting: Nurse Practitioner

## 2023-03-07 ENCOUNTER — Other Ambulatory Visit: Payer: Self-pay | Admitting: Oncology

## 2023-03-07 DIAGNOSIS — Z1231 Encounter for screening mammogram for malignant neoplasm of breast: Secondary | ICD-10-CM

## 2023-03-16 ENCOUNTER — Emergency Department (HOSPITAL_COMMUNITY)
Admission: EM | Admit: 2023-03-16 | Discharge: 2023-03-16 | Discharge status: Against Medical Advice | Payer: Medicaid Other

## 2023-03-16 NOTE — ED Notes (Signed)
Called x 3 for triage at different times and unable to locate in [place]

## 2023-03-21 DIAGNOSIS — Z1231 Encounter for screening mammogram for malignant neoplasm of breast: Secondary | ICD-10-CM

## 2023-03-30 ENCOUNTER — Inpatient Hospital Stay: Payer: Medicaid Other | Admitting: Oncology

## 2023-03-30 ENCOUNTER — Inpatient Hospital Stay: Payer: Medicaid Other | Attending: Nurse Practitioner

## 2023-04-09 ENCOUNTER — Ambulatory Visit (INDEPENDENT_AMBULATORY_CARE_PROVIDER_SITE_OTHER): Payer: Medicaid Other

## 2023-04-09 ENCOUNTER — Telehealth: Payer: Self-pay | Admitting: Oncology

## 2023-04-09 ENCOUNTER — Encounter (HOSPITAL_COMMUNITY): Payer: Self-pay

## 2023-04-09 ENCOUNTER — Ambulatory Visit (HOSPITAL_COMMUNITY)
Admission: EM | Admit: 2023-04-09 | Discharge: 2023-04-09 | Disposition: A | Discharge status: Home / Self Care | Payer: Medicaid Other | Attending: Family Medicine | Admitting: Family Medicine

## 2023-04-09 DIAGNOSIS — M25511 Pain in right shoulder: Secondary | ICD-10-CM

## 2023-04-09 DIAGNOSIS — M7918 Myalgia, other site: Secondary | ICD-10-CM | POA: Diagnosis not present

## 2023-04-09 MED ORDER — MELOXICAM 15 MG PO TABS: 15.0000 mg | ORAL_TABLET | Freq: Every day | ORAL | 0 refills | Status: DC

## 2023-04-09 NOTE — Telephone Encounter (Signed)
 Left patient a vm regarding upcoming appointment

## 2023-04-09 NOTE — Discharge Instructions (Addendum)
 Please take your meloxicam  daily for the next 14 days, this should decrease some of the inflammation you are dealing with as well as help with some of the pain.  If you feel like the pain is not improving despite doing these things you may come back and see us  or if you feel like your symptoms are worsening.
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
## 2023-04-10 ENCOUNTER — Ambulatory Visit
Admission: RE | Admit: 2023-04-10 | Discharge: 2023-04-10 | Disposition: A | Discharge status: Home / Self Care | Payer: Medicaid Other | Source: Ambulatory Visit | Attending: Oncology | Admitting: Oncology

## 2023-04-10 DIAGNOSIS — Z1231 Encounter for screening mammogram for malignant neoplasm of breast: Secondary | ICD-10-CM

## 2023-06-11 ENCOUNTER — Other Ambulatory Visit: Payer: Medicaid Other

## 2023-06-11 ENCOUNTER — Ambulatory Visit: Payer: Medicaid Other | Admitting: Oncology

## 2023-06-12 ENCOUNTER — Inpatient Hospital Stay: Payer: Medicaid Other | Attending: Nurse Practitioner

## 2023-06-12 ENCOUNTER — Inpatient Hospital Stay: Payer: Medicaid Other | Admitting: Oncology

## 2023-08-09 ENCOUNTER — Ambulatory Visit: Admitting: Nurse Practitioner

## 2023-08-16 ENCOUNTER — Ambulatory Visit: Admitting: Nurse Practitioner

## 2023-08-16 ENCOUNTER — Encounter: Payer: Self-pay | Admitting: Nurse Practitioner

## 2023-08-16 VITALS — BP 128/82 | HR 68 | Wt 182.2 lb

## 2023-08-16 DIAGNOSIS — Z85038 Personal history of other malignant neoplasm of large intestine: Secondary | ICD-10-CM | POA: Diagnosis not present

## 2023-08-16 DIAGNOSIS — K6289 Other specified diseases of anus and rectum: Secondary | ICD-10-CM | POA: Insufficient documentation

## 2023-08-16 DIAGNOSIS — K625 Hemorrhage of anus and rectum: Secondary | ICD-10-CM

## 2023-08-16 MED ORDER — LIDOCAINE 5 % EX OINT: 1.0000 | TOPICAL_OINTMENT | Freq: Three times a day (TID) | CUTANEOUS | 0 refills | Status: DC | PRN

## 2023-08-16 NOTE — Progress Notes (Signed)
 Katie Kief, DNP, AGNP-c [facility] [address] [place], [place] [address] [phone]   ACUTE VISIT- ESTABLISHED PATIENT  Blood pressure 128/82, pulse 68, weight 182 lb 3.2 oz (82.6 kg).  Subjective:  HPI    ROS negative except for what is listed in HPI. History, Medications, Surgery, SDOH, and Family History reviewed and updated as appropriate.  Objective:  Physical Exam Abdominal:     General: Abdomen is flat. There is no distension.     Palpations: Abdomen is soft.     Tenderness: There is no abdominal tenderness. There is no guarding.  Genitourinary:    Exam position: Lithotomy position.     Comments: No thrombosed or obvious hemorrhoids present on examination. No visible fissure on external examination.   Musculoskeletal:        General: Normal range of motion.   Skin:    General: Skin is warm and dry.     Capillary Refill: Capillary refill takes less than 2 seconds.   Neurological:     General: No focal deficit present.     Mental Status: She is oriented to person, place, and time.     Motor: No weakness.     Gait: Gait normal.   Psychiatric:        Mood and Affect: Mood normal.        Behavior: Behavior normal.         Assessment & Plan:   Problem List Items Addressed This Visit     Rectal pain - Primary   Severe anal pain, bleeding, and burning sensation possibly due to an anal fissure or internal hemorrhoid, following constipation since late May. Examination revealed no obvious external fissure and no visualized hemorrhoids. The pain is affecting her ability to sit or eat due to fear of pain during defecation. Given her colon cancer history, further evaluation is necessary to rule out complications. Nifedipine ointment was considered but is costly due to compounding requirements. Lidocaine  ointment is a cost-effective alternative for pain management. - Apply lidocaine  ointment to the affected area 3-4 times daily for pain  management. - Use stool softeners twice daily and consider polyethylene glycol to ensure smooth bowel movements. - Encourage sitz baths with warm water and Epsom salt to alleviate discomfort. - Refer urgently to Dr. Savannah Curlin (or other provider within this office) for gastrointestinal evaluation to assess the internal condition and rule out significant hemorrhoids or other complications. - Advise the use of ice wrapped in gauze for temporary relief. - Take acetaminophen  every 6-8 hours for pain management. - Consider using naproxen  if additional pain relief is needed.      Relevant Medications   lidocaine  (XYLOCAINE ) 5 % ointment   Other Relevant Orders   Ambulatory referral to Gastroenterology   Rectal bleeding   Rectal bleeding of unknown origin. Consider possible anal fissure vs internal hemorrhoid vs neoplastic changes. No external hemorrhoids visualized. No obvious fissures. Internal exam deferred due to pain and my limited ability to make judgement based experience. Lidocaine  applied to aid in pain management. Avoid opiates to reduce risk of constipation. Recommend sitz baths, stool softeners, lidocaine , and preparation H. Nifedipine considered, but cost is a factor. Referral to GI placed for urgent evaluation.  -start lidocaine  3-4 times a day - start sitz baths 3-4 times a day - take tylenol  every 6-8 hours and add naproxen  once daily for pain - ice packs may help - use stool softeners to aid in soft bowel movements.  - stay well hydrated. -  go to the ER if you have worsening symptoms.       History of colon cancer   Flexible sigmoidoscopy with Dr. 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 Records review shows last CT in 12/23 with findings of Surgical changes of prior partial sigmoidectomy and reanastomotic, with a new 10 mm focus of soft tissue nodularity in  the sigmoid mesentery near the anastomotic suture line, nonspecific but possibly reflecting disease recurrence, consider further evaluation with serum tumor markers and PET-CT. Further chart review shows that she has been lost to follow-up with oncology with last visit prior to the CT. It does not appear, from what I can see, that a PET-CT was completed. This does raise concern. Urgent GI referral placed. Will most likely need oncology follow-up in near future.        Kaedin Hicklin E Haneen Bernales, DNP, AGNP-c

## 2023-08-16 NOTE — Assessment & Plan Note (Signed)
 Severe anal pain, bleeding, and burning sensation possibly due to an anal fissure or internal hemorrhoid, following constipation since late May. Examination revealed no obvious external fissure and no visualized hemorrhoids. The pain is affecting her ability to sit or eat due to fear of pain during defecation. Given her colon cancer history, further evaluation is necessary to rule out complications. Nifedipine ointment was considered but is costly due to compounding requirements. Lidocaine  ointment is a cost-effective alternative for pain management. - Apply lidocaine  ointment to the affected area 3-4 times daily for pain management. - Use stool softeners twice daily and consider polyethylene glycol to ensure smooth bowel movements. - Encourage sitz baths with warm water and Epsom salt to alleviate discomfort. - Refer urgently to Dr. Savannah Curlin (or other provider within this office) for gastrointestinal evaluation to assess the internal condition and rule out significant hemorrhoids or other complications. - Advise the use of ice wrapped in gauze for temporary relief. - Take acetaminophen  every 6-8 hours for pain management. - Consider using naproxen  if additional pain relief is needed.

## 2023-08-16 NOTE — Assessment & Plan Note (Signed)
 Rectal bleeding of unknown origin. Consider possible anal fissure vs internal hemorrhoid vs neoplastic changes. No external hemorrhoids visualized. No obvious fissures. Internal exam deferred due to pain and my limited ability to make judgement based experience. Lidocaine  applied to aid in pain management. Avoid opiates to reduce risk of constipation. Recommend sitz baths, stool softeners, lidocaine , and preparation H. Nifedipine considered, but cost is a factor. Referral to GI placed for urgent evaluation.  -start lidocaine  3-4 times a day - start sitz baths 3-4 times a day - take tylenol  every 6-8 hours and add naproxen  once daily for pain - ice packs may help - use stool softeners to aid in soft bowel movements.  - stay well hydrated. - go to the ER if you have worsening symptoms.

## 2023-08-16 NOTE — Assessment & Plan Note (Addendum)
 Flexible sigmoidoscopy with Dr. 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 Records review shows last CT in 12/23 with findings of Surgical changes of prior partial sigmoidectomy and reanastomotic, with a new 10 mm focus of soft tissue nodularity in the sigmoid mesentery near the anastomotic suture line, nonspecific but possibly reflecting disease recurrence, consider further evaluation with serum tumor markers and PET-CT. Further chart review shows that she has been lost to follow-up with oncology with last visit prior to the CT. It does not appear, from what I can see, that a PET-CT was completed. This does raise concern. Urgent GI referral placed. Will most likely need oncology follow-up in near future.

## 2023-08-16 NOTE — Patient Instructions (Addendum)
 I want you to start with a stool softener twice a day to keep your stools soft to reduce the pain. You can also use miralax to help keep the stools soft.  Keep using sitz baths 4 times a day.  Ice wrapped in toilet paper or a washcloth can help. Take Tylenol  every 6-8 hours, you can also add the naproxen  twice a day to help.   Use the lidocaine  ointment 3-4 times a day to the area. This will help reduce the pain.  I sent a referral to Dr. Savannah Curlin urgently to get you in ASAP.

## 2023-08-16 NOTE — Addendum Note (Signed)
 Addended by: Sabreen Kitchen, Abraham Hoffmann E on: 08/16/2023 02:56 PM   Modules accepted: Orders

## 2023-08-17 ENCOUNTER — Telehealth: Payer: Self-pay | Admitting: Nurse Practitioner

## 2023-08-17 ENCOUNTER — Ambulatory Visit (HOSPITAL_BASED_OUTPATIENT_CLINIC_OR_DEPARTMENT_OTHER)

## 2023-08-17 NOTE — Telephone Encounter (Signed)
 LVM for pt with appt information for STAT CT, address for [facility],  To arrive at 12:45pm to drink contrast at 1:00pm and 2:00pm and the CT will be at 3:00p. Gave number for centralized scheduling [phone] if cancelling is needed as well.

## 2023-08-20 ENCOUNTER — Ambulatory Visit (HOSPITAL_BASED_OUTPATIENT_CLINIC_OR_DEPARTMENT_OTHER): Admission: RE | Admit: 2023-08-20 | Source: Ambulatory Visit

## 2023-08-22 ENCOUNTER — Ambulatory Visit (HOSPITAL_COMMUNITY)
Admission: RE | Admit: 2023-08-22 | Discharge: 2023-08-22 | Disposition: A | Discharge status: Home / Self Care | Source: Ambulatory Visit | Attending: Nurse Practitioner | Admitting: Nurse Practitioner

## 2023-08-22 DIAGNOSIS — K6289 Other specified diseases of anus and rectum: Secondary | ICD-10-CM | POA: Insufficient documentation

## 2023-08-22 DIAGNOSIS — Z85038 Personal history of other malignant neoplasm of large intestine: Secondary | ICD-10-CM | POA: Diagnosis present

## 2023-08-22 DIAGNOSIS — K625 Hemorrhage of anus and rectum: Secondary | ICD-10-CM | POA: Diagnosis present

## 2023-08-22 MED ORDER — SODIUM CHLORIDE (PF) 0.9 % IJ SOLN
INTRAMUSCULAR | Status: AC
  Filled 2023-08-22: qty 50

## 2023-08-22 MED ORDER — IOHEXOL 300 MG/ML  SOLN
100.0000 mL | Freq: Once | INTRAMUSCULAR | Status: AC | PRN
  Administered 2023-08-22: 100 mL via INTRAVENOUS

## 2023-08-23 ENCOUNTER — Ambulatory Visit: Payer: Self-pay | Admitting: Nurse Practitioner

## 2023-09-04 ENCOUNTER — Encounter: Payer: Self-pay | Admitting: Gastroenterology

## 2023-09-04 ENCOUNTER — Ambulatory Visit: Admitting: Gastroenterology

## 2023-09-04 VITALS — BP 106/60 | HR 70 | Ht 63.0 in | Wt 180.2 lb

## 2023-09-04 DIAGNOSIS — K625 Hemorrhage of anus and rectum: Secondary | ICD-10-CM

## 2023-09-04 DIAGNOSIS — K602 Anal fissure, unspecified: Secondary | ICD-10-CM | POA: Diagnosis not present

## 2023-09-04 DIAGNOSIS — K6289 Other specified diseases of anus and rectum: Secondary | ICD-10-CM

## 2023-09-04 MED ORDER — HYDROCODONE-IBUPROFEN 5-200 MG PO TABS: 1.0000 | ORAL_TABLET | Freq: Three times a day (TID) | ORAL | 0 refills | Status: AC | PRN

## 2023-09-04 MED ORDER — AMBULATORY NON FORMULARY MEDICATION: 1 refills | Status: DC

## 2023-09-04 NOTE — Progress Notes (Signed)
 09/04/2023 Katie Reed [id number] September 11, 1980   HISTORY OF PRESENT ILLNESS: This is a 43 year old female who is previously a patient of Dr. Eda.  She has history of stage IIb well-differentiated adenocarcinoma of the sigmoid colon status post robotic assisted sigmoidectomy in May 2019.  She is here today with complaints of rectal pain and bleeding since the end of May.  She says that she believes this followed an episode of being constipated.  Says that she has been trying to drink water and eat fruits, etc.  Pain has been consistent, but symptoms got a little bit better and then a little bit worse again.  Very uncomfortable at this time.  Says that she will have bright red blood that will drip in the toilet bowl.  She has tried suppositories, topical lidocaine , Tylenol  without relief.  Just had CT scan abdomen and pelvis with contrast 08/22/2023 that looked good.  Colonoscopy 11/2021:  - Diverticulosis in the sigmoid colon. - Patent end- to- end colo- colonic anastomosis, characterized by healthy appearing mucosa. - One 3 mm polyp at the hepatic flexure, removed with a cold snare. Resected and retrieved. - The examination was otherwise normal on direct and retroflexion views.  Surgical [P], colon, hepatic flexure, polyp (1) - SESSILE SERRATED POLYP WITHOUT DYSPLASIA  Past Medical History:  Diagnosis Date   Abnormal glucose tolerance test (GTT) during pregnancy, antepartum 12/31/2014   Early 1 hour = 135, passed 3 hr; Third trim= 85 Proven to 9#8oz   Active labor 04/01/2015   AMA (advanced maternal age) multigravida 35+ 12/31/2014   Anxiety    Anxiety    Anxiety and depression    Colon cancer (HCC) dx'd 12/2016   oral chemo comp 2019   Colonic mass 07/25/2017   Depression    Encounter to establish care with new doctor 10/07/2020   High risk pregnancy due to history of preterm labor 12/31/2014   Delivered at 35 weeks   Intussusception intestine (HCC) 02/19/2017   Muscle  spasms of neck 10/07/2020   Obesity in pregnancy 12/31/2014   Preterm labor    Psychophysiological insomnia 10/07/2020   Right arm numbness 10/07/2020   Supervision of high-risk pregnancy 12/31/2014    Clinic   High Risk - transfer from [facility] Prenatal Labs  Dating  by LMP Blood type: A/Positive/-- (10/20 0000)   Genetic Screen 1 Screen: too late   AFP: too late    Quad:  Too late    NIPS: Antibody:Negative (10/20 0000)  Anatomic US   wnl Rubella: Immune (10/20 0000)  GTT Early:   135  3 hour = 79-150-125-129 (passed)      Third trimester: 85  RPR:     Flu vaccine  declined HBsAg:   ne   Tobacco smoking affecting pregnancy, antepartum 12/31/2014   Weakness of right arm 10/07/2020   Past Surgical History:  Procedure Laterality Date   FLEXIBLE SIGMOIDOSCOPY N/A 02/19/2017   Procedure: FLEXIBLE SIGMOIDOSCOPY;  Surgeon: Elicia Claw, MD;  Location: [facility];  Service: Gastroenterology;  Laterality: N/A;   THERAPEUTIC ABORTION     elective    reports that she has been smoking cigarettes. She has a 3.5 pack-year smoking history. She has never used smokeless tobacco. She reports current alcohol use. She reports that she does not use drugs. family history includes Cancer in her sister; Cancer (age of onset: 49) in her mother; Colon cancer in her mother; Colon cancer (age of onset: 56 - 70) in her sister; Hypertension  in her mother. No Known Allergies    Outpatient Encounter Medications as of 09/04/2023  Medication Sig   lidocaine  (XYLOCAINE ) 5 % ointment Apply 1 Application topically 3 (three) times daily as needed (rectal pain).   meloxicam  (MOBIC ) 15 MG tablet Take 1 tablet (15 mg total) by mouth daily.   naproxen  (NAPROSYN ) 500 MG tablet Take 1 tablet (500 mg total) by mouth 2 (two) times daily as needed.   sertraline  (ZOLOFT ) 50 MG tablet Take 1 tablet (50 mg total) by mouth at bedtime.   Vitamin D , Ergocalciferol , (DRISDOL ) 1.25 MG (50000 UNIT) CAPS capsule Take 1 capsule  (50,000 Units total) by mouth every 7 (seven) days.   No facility-administered encounter medications on file as of 09/04/2023.    REVIEW OF SYSTEMS  : All other systems reviewed and negative except where noted in the History of Present Illness.   PHYSICAL EXAM: BP 106/60 (BP Location: Right Arm, Patient Position: Sitting, Cuff Size: Large)   Pulse 70   Ht 5' 3 (1.6 m)   Wt 180 lb 4 oz (81.8 kg)   BMI 31.93 kg/m  General: Well developed AA female in no acute distress Head: Normocephalic and atraumatic Eyes:  Sclerae anicteric, conjunctiva pink. Ears: Normal auditory acuity Rectal:  Small non-inflamed/non-bleeding external hemorrhoid.  Appeared to have a posterior anal fissure visually and I believe that I felt it as well, was very tender and she did not tolerate DRE.   Musculoskeletal: Symmetrical with no gross deformities  Skin: No lesions on visible extremities Extremities: No edema  Neurological: Alert oriented x 4, grossly non-focal Psychological:  Alert and cooperative. Normal mood and affect  ASSESSMENT AND PLAN: *Anal/rectal pain with bleeding x 5 weeks or so.  No improvement with topical lidocaine , suppositories, or tylenol .  Clinically seems to have a posterior anal fissure.  I believe I felt on exam today as well as very tender posteriorly, although exam not thorough due to extreme pain.  Will treat empirically for fissure with diltiazem gel mixed with lidocaine  3 times a day for the next 6 to 8 weeks.  Advised to drink a lot of water, keep stools soft.  Can try sitz baths with warm water and Epsom salts.  Avoid overaggressive wiping/hygiene.  Will bring her back for follow-up in about 8 weeks for repeat exam.  Also going to give her just a very small amount of hydrocodone  to try to help her deal with her pain over the next couple of days.  Prescription for 10 pills were sent to the pharmacy.  She was advised to use sparingly as they can cause constipation   CC:  Early, Sara E,  NP

## 2023-09-04 NOTE — Patient Instructions (Addendum)
 We have sent a prescription for Diltiazem 2% gel w/Lidocaine  5 % to [facility] for you. Using your index finger, you should apply a small amount of medication inside the rectum up to your first knuckle/joint three times daily x 6-8 weeks.  [facility]'s information is below: Address: [address], [place], [place] [address]  [phone]  *Please DO NOT go directly from our office to pick up this medication! Give the pharmacy 1 day to process the prescription as this is compounded and takes time to make.  We have sent the following medications to your pharmacy for you to pick up at your convenience: Hydrocodone  5 mg every 8 hours as needed.

## 2023-09-05 ENCOUNTER — Other Ambulatory Visit: Payer: Self-pay | Admitting: Nurse Practitioner

## 2023-09-05 DIAGNOSIS — K6289 Other specified diseases of anus and rectum: Secondary | ICD-10-CM

## 2023-09-05 DIAGNOSIS — E559 Vitamin D deficiency, unspecified: Secondary | ICD-10-CM

## 2023-09-05 NOTE — Telephone Encounter (Signed)
   This was already sent in for a year supply on 11/10/2022

## 2023-09-06 NOTE — Telephone Encounter (Signed)
 Last apt 08/16/23

## 2023-10-04 ENCOUNTER — Encounter (HOSPITAL_COMMUNITY): Payer: Self-pay

## 2023-10-04 ENCOUNTER — Ambulatory Visit (HOSPITAL_COMMUNITY): Admission: EM | Admit: 2023-10-04 | Discharge: 2023-10-04 | Disposition: A | Discharge status: Home / Self Care

## 2023-10-04 ENCOUNTER — Emergency Department (HOSPITAL_COMMUNITY)
Admission: EM | Admit: 2023-10-04 | Discharge: 2023-10-04 | Disposition: A | Discharge status: Home / Self Care | Attending: Emergency Medicine | Admitting: Emergency Medicine

## 2023-10-04 ENCOUNTER — Other Ambulatory Visit: Payer: Self-pay

## 2023-10-04 DIAGNOSIS — G8929 Other chronic pain: Secondary | ICD-10-CM | POA: Diagnosis not present

## 2023-10-04 DIAGNOSIS — Z8719 Personal history of other diseases of the digestive system: Secondary | ICD-10-CM | POA: Diagnosis not present

## 2023-10-04 DIAGNOSIS — K6289 Other specified diseases of anus and rectum: Secondary | ICD-10-CM

## 2023-10-04 HISTORY — DX: Anal fissure, unspecified: K60.2

## 2023-10-04 MED ORDER — OXYCODONE-ACETAMINOPHEN 5-325 MG PO TABS
1.0000 | ORAL_TABLET | Freq: Once | ORAL | Status: AC
  Administered 2023-10-04: 1 via ORAL
  Filled 2023-10-04: qty 1

## 2023-10-04 MED ORDER — OXYCODONE-ACETAMINOPHEN 5-325 MG PO TABS: 1.0000 | ORAL_TABLET | Freq: Three times a day (TID) | ORAL | 0 refills | Status: DC | PRN

## 2023-10-04 MED ORDER — ONDANSETRON 4 MG PO TBDP
8.0000 mg | ORAL_TABLET | Freq: Once | ORAL | Status: AC
  Administered 2023-10-04: 8 mg via ORAL
  Filled 2023-10-04: qty 2

## 2023-10-04 MED ORDER — NITROGLYCERIN NICU 2% OINTMENT: 1.0000 | TOPICAL_OINTMENT | Freq: Two times a day (BID) | TRANSDERMAL | 0 refills | Status: DC

## 2023-10-04 NOTE — ED Triage Notes (Addendum)
 Patient presenting with rectal pain and diarrhea onset ongoing but worse today. Diarrhea started today. Patient was seen by GI for a hemorrhoid and was diagnosed with a fissure. States everything is getting worse.  Prescriptions or OTC medications tried: Yes- Ibuprofen , Hydrocodone , numbing cream    with mild relief. States she has ran out of the prescribed medications.

## 2023-10-04 NOTE — ED Triage Notes (Signed)
 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  Reports mild relief with prescribed medications.

## 2023-10-04 NOTE — Discharge Instructions (Signed)
  1. Chronic rectal pain (Primary) 2. History of rectal fissure - Recommend follow-up in emergency department for acute pain control for chronic rectal pain with rectal fissures. - Care and management of chronic rectal pain is beyond the means of urgent care for treatment. - Please go directly to the emergency department for further management and evaluation.

## 2023-10-04 NOTE — Discharge Instructions (Addendum)
 Take the prescription to [facility] at the [facility] in [place].  This is a Set designer that can make these specific drugs.

## 2023-10-04 NOTE — ED Provider Notes (Signed)
 [place] EMERGENCY DEPARTMENT AT [facility] Provider Note   CSN: [id number] Arrival date & time: 10/04/23  1840     Patient presents with: Rectal Pain   Katie Reed is a 43 y.o. female here with rectal pain ongoing 1-2 months.  Has seen GI, diagnosed with rectal fissure and hemorrhoid.  She's taken lidocaine  cream , suppositories, preparation H, still having sharp stabbing pain with some bleeding with defacation.    HPI     Prior to Admission medications   Medication Sig Start Date End Date Taking? Authorizing Provider  AMBULATORY NON FORMULARY MEDICATION Medication Name: Diltiazem 2% gel :Lidocaine  5% - Using your index finger, you should apply a small amount of medication inside the rectum up to your first knuckle/joint three times daily x 6-8 weeks. 09/04/23   Zehr, Jessica D, PA-C  hydrocodone -ibuprofen  (VICOPROFEN ) 5-200 MG tablet Take 1 tablet by mouth every 8 (eight) hours as needed for pain. 09/04/23   Zehr, Jessica D, PA-C  lidocaine  (XYLOCAINE ) 5 % ointment APPLY TOPICALLY TO THE AFFECTED AREA THREE TIMES DAILY AS NEEDED FOR ANORECTAL PAIN 09/06/23   Joyce Norleen JAYSON, MD  meloxicam  (MOBIC ) 15 MG tablet Take 1 tablet (15 mg total) by mouth daily. 04/09/23   Jha, Panav, MD  naproxen  (NAPROSYN ) 500 MG tablet Take 1 tablet (500 mg total) by mouth 2 (two) times daily as needed. 08/04/22   Rancour, Garnette, MD  sertraline  (ZOLOFT ) 50 MG tablet Take 1 tablet (50 mg total) by mouth at bedtime. 08/17/22   Early, Sara E, NP  Vitamin D , Ergocalciferol , (DRISDOL ) 1.25 MG (50000 UNIT) CAPS capsule Take 1 capsule (50,000 Units total) by mouth every 7 (seven) days. 11/10/22   Early, Sara E, NP    Allergies: Patient has no known allergies.    Review of Systems  Updated Vital Signs BP (!) 141/78 (BP Location: Right Arm)   Pulse 88   Temp 98 F (36.7 C) (Oral)   Resp (!) 24   Ht 5' 3 (1.6 m)   Wt 80.7 kg   LMP 09/13/2023 (Approximate)   SpO2 95%   BMI 31.53 kg/m    Physical Exam Constitutional:      General: She is not in acute distress. HENT:     Head: Normocephalic and atraumatic.  Eyes:     Conjunctiva/sclera: Conjunctivae normal.     Pupils: Pupils are equal, round, and reactive to light.  Cardiovascular:     Rate and Rhythm: Normal rate and regular rhythm.  Pulmonary:     Effort: Pulmonary effort is normal. No respiratory distress.  Skin:    General: Skin is warm and dry.  Neurological:     General: No focal deficit present.     Mental Status: She is alert. Mental status is at baseline.  Psychiatric:        Mood and Affect: Mood normal.        Behavior: Behavior normal.     (all labs ordered are listed, but only abnormal results are displayed) Labs Reviewed - No data to display  EKG: None  Radiology: No results found.   Procedures   Medications Ordered in the ED  oxyCODONE -acetaminophen  (PERCOCET/ROXICET) 5-325 MG per tablet 1 tablet (1 tablet Oral Given 10/04/23 1908)  ondansetron  (ZOFRAN -ODT) disintegrating tablet 8 mg (8 mg Oral Given 10/04/23 1909)  Medical Decision Making  Pt here with rectal pain, strongly suspect rectal fissure per description  We can try nitroglycerin  ointment as a smooth muscle relaxer  She could not afford the diltiazem ointment prescribed by GI, which was $50.  I am not certain how much the nitroglycerin  ointment will be, but wil also represcribe lidocaine  and short course of oral pain medications     Final diagnoses:  Rectal pain    ED Discharge Orders     None          Cottie Donnice PARAS, MD 10/04/23 2229

## 2023-10-04 NOTE — ED Provider Notes (Signed)
 [facility] [place]  Note:  This document was prepared using Dragon voice recognition software and may include unintentional dictation errors.  MRN: [id number] DOB: May 26, 1980  Subjective:   Katie Reed is a 43 y.o. female presenting for severe rectal pain with diarrhea.  Patient reports that symptoms have been ongoing since April but she started having diarrhea today which exacerbated her rectal fissures.  Patient was previously being treated by GI with topical medications states that the medications are not helping and she ran out of medication anyway.  Patient is in severe pain and states the pain is getting worse nothing is helping her.  Patient has past history of colon cancer, rectal bleeding, and recently diagnosed anal fissures.  No current facility-administered medications for this encounter.  Current Outpatient Medications:    hydrocodone -ibuprofen  (VICOPROFEN ) 5-200 MG tablet, Take 1 tablet by mouth every 8 (eight) hours as needed for pain., Disp: 10 tablet, Rfl: 0   lidocaine  (XYLOCAINE ) 5 % ointment, APPLY TOPICALLY TO THE AFFECTED AREA THREE TIMES DAILY AS NEEDED FOR ANORECTAL PAIN, Disp: 30 g, Rfl: 0   meloxicam  (MOBIC ) 15 MG tablet, Take 1 tablet (15 mg total) by mouth daily., Disp: 14 tablet, Rfl: 0   naproxen  (NAPROSYN ) 500 MG tablet, Take 1 tablet (500 mg total) by mouth 2 (two) times daily as needed., Disp: 30 tablet, Rfl: 0   sertraline  (ZOLOFT ) 50 MG tablet, Take 1 tablet (50 mg total) by mouth at bedtime., Disp: 90 tablet, Rfl: 3   Vitamin D , Ergocalciferol , (DRISDOL ) 1.25 MG (50000 UNIT) CAPS capsule, Take 1 capsule (50,000 Units total) by mouth every 7 (seven) days., Disp: 12 capsule, Rfl: 3   AMBULATORY NON FORMULARY MEDICATION, Medication Name: Diltiazem 2% gel :Lidocaine  5% - Using your index finger, you should apply a small amount of medication inside the rectum up to your first knuckle/joint three times daily x 6-8 weeks., Disp: 45 g, Rfl: 1   No Known  Allergies  Past Medical History:  Diagnosis Date   Abnormal glucose tolerance test (GTT) during pregnancy, antepartum 12/31/2014   Early 1 hour = 135, passed 3 hr; Third trim= 85 Proven to 9#8oz   Active labor 04/01/2015   AMA (advanced maternal age) multigravida 35+ 12/31/2014   Anal fissure    Anxiety    Anxiety    Anxiety and depression    Colon cancer (HCC) dx'd 12/2016   oral chemo comp 2019   Colonic mass 07/25/2017   Depression    Encounter to establish care with new doctor 10/07/2020   High risk pregnancy due to history of preterm labor 12/31/2014   Delivered at 35 weeks   Intussusception intestine (HCC) 02/19/2017   Muscle spasms of neck 10/07/2020   Obesity in pregnancy 12/31/2014   Preterm labor    Psychophysiological insomnia 10/07/2020   Right arm numbness 10/07/2020   Supervision of high-risk pregnancy 12/31/2014    Clinic   High Risk - transfer from [facility] Prenatal Labs  Dating  by LMP Blood type: A/Positive/-- (10/20 0000)   Genetic Screen 1 Screen: too late   AFP: too late    Quad:  Too late    NIPS: Antibody:Negative (10/20 0000)  Anatomic US   wnl Rubella: Immune (10/20 0000)  GTT Early:   135  3 hour = 79-150-125-129 (passed)      Third trimester: 85  RPR:     Flu vaccine  declined HBsAg:   ne   Tobacco smoking affecting pregnancy, antepartum 12/31/2014  Weakness of right arm 10/07/2020     Past Surgical History:  Procedure Laterality Date   FLEXIBLE SIGMOIDOSCOPY N/A 02/19/2017   Procedure: FLEXIBLE SIGMOIDOSCOPY;  Surgeon: Elicia Claw, MD;  Location: [facility];  Service: Gastroenterology;  Laterality: N/A;   THERAPEUTIC ABORTION     elective    Family History  Problem Relation Age of Onset   Cancer Mother 5       Colon   Hypertension Mother    Colon cancer Mother    Cancer Sister    Colon cancer Sister 53 - 81   Alcohol abuse Neg Hx    Arthritis Neg Hx    Asthma Neg Hx    Birth defects Neg Hx    COPD Neg Hx    Depression  Neg Hx    Diabetes Neg Hx    Drug abuse Neg Hx    Early death Neg Hx    Hearing loss Neg Hx    Heart disease Neg Hx    Hyperlipidemia Neg Hx    Kidney disease Neg Hx    Learning disabilities Neg Hx    Mental illness Neg Hx    Mental retardation Neg Hx    Miscarriages / Stillbirths Neg Hx    Stroke Neg Hx    Vision loss Neg Hx    Varicose Veins Neg Hx    Esophageal cancer Neg Hx    Rectal cancer Neg Hx    Stomach cancer Neg Hx     Social History   Tobacco Use   Smoking status: Some Days    Current packs/day: 0.25    Average packs/day: 0.3 packs/day for 14.0 years (3.5 ttl pk-yrs)    Types: Cigarettes   Smokeless tobacco: Never  Vaping Use   Vaping status: Never Used  Substance Use Topics   Alcohol use: Yes   Drug use: No    ROS Refer to HPI for ROS details.  Objective:   Vitals: BP 126/77 (BP Location: Right Arm)   Pulse 60   Temp 98.3 F (36.8 C) (Oral)   Resp 18   Ht 5' 3 (1.6 m)   Wt 178 lb 9.2 oz (81 kg)   LMP 09/13/2023 (Approximate)   SpO2 99%   BMI 31.63 kg/m   Physical Exam Vitals and nursing note reviewed.  Constitutional:      General: She is in acute distress.     Appearance: Normal appearance. She is well-developed. She is not ill-appearing or toxic-appearing.  HENT:     Head: Normocephalic and atraumatic.  Cardiovascular:     Rate and Rhythm: Normal rate.  Pulmonary:     Effort: Pulmonary effort is normal. No respiratory distress.  Skin:    General: Skin is warm and dry.  Neurological:     General: No focal deficit present.     Mental Status: She is alert and oriented to person, place, and time.  Psychiatric:        Mood and Affect: Mood normal. Affect is tearful.        Behavior: Behavior normal. Behavior is cooperative.     Procedures  No results found for this or any previous visit (from the past 24 hours).  No results found.   Assessment and Plan :     Discharge Instructions       1. Chronic rectal pain  (Primary) 2. History of rectal fissure - Recommend follow-up in emergency department for acute pain control for chronic rectal pain with rectal fissures. -  Care and management of chronic rectal pain is beyond the means of urgent care for treatment. - Please go directly to the emergency department for further management and evaluation.      Jamacia Jester B Isobelle Tuckett   Harman Langhans, [place] B, NP 10/04/23 1800

## 2023-10-04 NOTE — ED Notes (Signed)
 Patient is being discharged from the Urgent Care and sent to the Emergency Department via personal opperated vehicle . Per Ethel Aguas NP, patient is in need of higher level of care due to rectal pain and diarrhea with history of anal fissure. Patient is aware and verbalizes understanding of plan of care.  Vitals:   10/04/23 1734  BP: 126/77  Pulse: 60  Resp: 18  Temp: 98.3 F (36.8 C)  SpO2: 99%

## 2023-10-11 ENCOUNTER — Ambulatory Visit: Admitting: Nurse Practitioner

## 2023-10-16 ENCOUNTER — Other Ambulatory Visit: Payer: Self-pay

## 2023-10-16 ENCOUNTER — Emergency Department (HOSPITAL_COMMUNITY)
Admission: EM | Admit: 2023-10-16 | Discharge: 2023-10-16 | Disposition: A | Discharge status: Home / Self Care | Attending: Emergency Medicine | Admitting: Emergency Medicine

## 2023-10-16 ENCOUNTER — Encounter (HOSPITAL_COMMUNITY): Payer: Self-pay

## 2023-10-16 DIAGNOSIS — K6289 Other specified diseases of anus and rectum: Secondary | ICD-10-CM

## 2023-10-16 DIAGNOSIS — K644 Residual hemorrhoidal skin tags: Secondary | ICD-10-CM | POA: Insufficient documentation

## 2023-10-16 DIAGNOSIS — K602 Anal fissure, unspecified: Secondary | ICD-10-CM

## 2023-10-16 MED ORDER — KETOROLAC TROMETHAMINE 30 MG/ML IJ SOLN
15.0000 mg | Freq: Once | INTRAMUSCULAR | Status: AC
  Administered 2023-10-16: 15 mg via INTRAVENOUS
  Filled 2023-10-16: qty 1

## 2023-10-16 MED ORDER — MORPHINE SULFATE (PF) 4 MG/ML IV SOLN
6.0000 mg | Freq: Once | INTRAVENOUS | Status: AC
  Administered 2023-10-16: 6 mg via INTRAVENOUS
  Filled 2023-10-16: qty 2

## 2023-10-16 NOTE — ED Triage Notes (Signed)
 Arrives [facility] from home with ongoing rectal pain.   Has a known rectal fissure. Says she feels like everything has got worse and unable to have a bowel movement since yesterday. (+) rectal bleeding reported.

## 2023-10-16 NOTE — ED Provider Notes (Signed)
 [place] EMERGENCY DEPARTMENT AT [facility] Provider Note   CSN: [id number] Arrival date & time: 10/16/23  1945     Patient presents with: Rectal Pain   Katie Reed is a 43 y.o. female.   43 year old female with history of rectal fissures for several months presents due to ongoing pain.  Seen here multiple times for same.  Most recently seen about a week ago and was given nitroglycerin  cream which she states has not helped.  Denies any current rectal bleeding at this time.  No fever or chills.  Has not been seen by surgeon at this point       Prior to Admission medications   Medication Sig Start Date End Date Taking? Authorizing Provider  AMBULATORY NON FORMULARY MEDICATION Medication Name: Diltiazem 2% gel :Lidocaine  5% - Using your index finger, you should apply a small amount of medication inside the rectum up to your first knuckle/joint three times daily x 6-8 weeks. 09/04/23   Zehr, Jessica D, PA-C  hydrocodone -ibuprofen  (VICOPROFEN ) 5-200 MG tablet Take 1 tablet by mouth every 8 (eight) hours as needed for pain. 09/04/23   Zehr, Jessica D, PA-C  lidocaine  (XYLOCAINE ) 5 % ointment APPLY TOPICALLY TO THE AFFECTED AREA THREE TIMES DAILY AS NEEDED FOR ANORECTAL PAIN 09/06/23   Joyce Norleen JAYSON, MD  meloxicam  (MOBIC ) 15 MG tablet Take 1 tablet (15 mg total) by mouth daily. 04/09/23   Jha, Panav, MD  naproxen  (NAPROSYN ) 500 MG tablet Take 1 tablet (500 mg total) by mouth 2 (two) times daily as needed. 08/04/22   Rancour, Garnette, MD  nitroGLYCERIN  (NITROGLYN) 2 % OINT ointment Apply 1 Application topically every 12 (twelve) hours for 15 days. Apply pea-sized amount to suppository and insert into rectum. Wash hands thoroughly after 10/04/23 10/19/23  Cottie Donnice PARAS, MD  oxyCODONE -acetaminophen  (PERCOCET/ROXICET) 5-325 MG tablet Take 1 tablet by mouth every 8 (eight) hours as needed for up to 10 doses for severe pain (pain score 7-10). 10/04/23   Cottie Donnice PARAS, MD  sertraline   (ZOLOFT ) 50 MG tablet Take 1 tablet (50 mg total) by mouth at bedtime. 08/17/22   Early, Sara E, NP  Vitamin D , Ergocalciferol , (DRISDOL ) 1.25 MG (50000 UNIT) CAPS capsule Take 1 capsule (50,000 Units total) by mouth every 7 (seven) days. 11/10/22   Early, Sara E, NP    Allergies: Patient has no known allergies.    Review of Systems  All other systems reviewed and are negative.   Updated Vital Signs BP 102/82   Pulse 74   Temp 98.2 F (36.8 C)   Resp 17   Ht 1.6 m (5' 3)   Wt 79.4 kg   LMP 09/13/2023 (Approximate)   SpO2 100%   BMI 31.00 kg/m   Physical Exam Vitals and nursing note reviewed. Exam conducted with a chaperone present.  Constitutional:      General: She is not in acute distress.    Appearance: Normal appearance. She is well-developed. She is not toxic-appearing.  HENT:     Head: Normocephalic and atraumatic.  Eyes:     General: Lids are normal.     Conjunctiva/sclera: Conjunctivae normal.     Pupils: Pupils are equal, round, and reactive to light.  Neck:     Thyroid : No thyroid  mass.     Trachea: No tracheal deviation.  Cardiovascular:     Rate and Rhythm: Normal rate and regular rhythm.     Heart sounds: Normal heart sounds. No murmur heard.  No gallop.  Pulmonary:     Effort: Pulmonary effort is normal. No respiratory distress.     Breath sounds: Normal breath sounds. No stridor. No decreased breath sounds, wheezing, rhonchi or rales.  Abdominal:     General: There is no distension.     Palpations: Abdomen is soft.     Tenderness: There is no abdominal tenderness. There is no rebound.  Genitourinary:    Rectum: External hemorrhoid present.  Musculoskeletal:        General: No tenderness. Normal range of motion.     Cervical back: Normal range of motion and neck supple.  Skin:    General: Skin is warm and dry.     Findings: No abrasion or rash.  Neurological:     Mental Status: She is alert and oriented to person, place, and time. Mental status  is at baseline.     GCS: GCS eye subscore is 4. GCS verbal subscore is 5. GCS motor subscore is 6.     Cranial Nerves: No cranial nerve deficit.     Sensory: No sensory deficit.     Motor: Motor function is intact.  Psychiatric:        Attention and Perception: Attention normal.        Speech: Speech normal.        Behavior: Behavior normal.     (all labs ordered are listed, but only abnormal results are displayed) Labs Reviewed - No data to display  EKG: None  Radiology: No results found.   Procedures   Medications Ordered in the ED  ketorolac  (TORADOL ) 30 MG/ML injection 15 mg (has no administration in time range)  morphine  (PF) 4 MG/ML injection 6 mg (has no administration in time range)                                    Medical Decision Making Risk Prescription drug management.   Patient medicated for pain here and feels better.  Will give referral to general surgery     Final diagnoses:  None    ED Discharge Orders     None          Dasie Faden, MD 10/16/23 2213

## 2023-11-13 NOTE — Progress Notes (Deleted)
 Flu shot: Covid: Pneumonia: HPV:

## 2023-11-14 ENCOUNTER — Encounter: Payer: Medicaid Other | Admitting: Nurse Practitioner

## 2023-12-11 ENCOUNTER — Ambulatory Visit: Payer: Self-pay

## 2023-12-11 NOTE — Telephone Encounter (Signed)
 FYI Only or Action Required?: FYI only for provider.  Patient was last seen in primary care on 08/16/2023 by Early, Camie BRAVO, NP.  Called Nurse Triage reporting Weight Loss.  Symptoms began ongoing x months and worsening'.  Interventions attempted: Nothing.  Symptoms are: gradually worsening.  Triage Disposition: See PCP When Office is Open (Within 3 Days), See PCP Within 2 Weeks, See Physician Within 24 Hours  Patient/caregiver understands and will follow disposition?: Yes    Copied from CRM #[id number]. Topic: Clinical - Pink Word Triage >> Dec 11, 2023  5:48 PM Hadassah PARAS wrote: [place] Word triggered transfer to Nurse Triage. Feeling bad,has diaheria; loosing weight Reason for Disposition  [1] SEVERE diarrhea (e.g., 7 or more times / day more than normal) AND [2] present > 24 hours (1 day)  Diarrhea is a chronic symptom (recurrent or ongoing AND present > 4 weeks)  MODERATE unexplained weight loss (e.g., 5%, 8 to 10 pounds [4 - 4.5 kg] in person who weighs 170 to 200 pounds [75 - 90 kg])  Answer Assessment - Initial Assessment Questions 1. MAIN CONCERN: What is your main concern today?     Unintended wt loss 2. WEIGHT LOSS: How much weight have you lost?  (e.g., lbs., kgs.)  Over what period of time have you lost this weight?  (e.g., number of days, weeks, months, years)     Over past couple of months -about 30 lbs 3. BASELINE WEIGHT: What is your baseline or normal weight? (e.g., How much do you usually weigh?)     180 lbs 4. CAUSE: What do you think is causing the weight loss? (e.g., depression, anxiety, medicine side effect, pain, trouble swallowing, substance or alcohol use problem, eating disorder)     Unsure if it is related to not being able to hold food  5. PRIOR EVALUATION: Have you been evaluated by a doctor for your weight loss? If Yes, ask When was your last visit? What did your doctor (or NP/PA) tell you about the possible cause?     na 6. HEART FAILURE  TREATMENT: Do you have heart failure? If Yes, ask: Have you taken new or extra water pills (diuretics) recently? (e.g., furosemide; bumetanide). What is your target weight?     na 7. OTHER SYMPTOMS: Do you have any other symptoms? (e.g., anxiety or depression, blood in stool, breathing difficulty, diarrhea, fever, trouble swallowing)     no 8. PREGNANCY: Is there any chance you are pregnant? When was your last menstrual period?     no   Pt stated when eats it comes out in watery loose stools.  Hx/ fissure  Answer Assessment - Initial Assessment Questions 1. DIARRHEA SEVERITY: How bad is the diarrhea? How many more stools have you had in the past 24 hours than normal?      Watery loose stools 2. ONSET: When did the diarrhea begin?      Ongoing for several months 3. STOOL DESCRIPTION:  How loose or watery is the diarrhea? What is the stool color? Is there any blood or mucous in the stool?     Very lose no mucous 4. VOMITING: Are you also vomiting? If Yes, ask: How many times in the past 24 hours?      no 5. ABDOMEN PAIN: Are you having any abdomen pain? If Yes, ask: What does it feel like? (e.g., crampy, dull, intermittent, constant)      Pain near navel 6. ABDOMEN PAIN SEVERITY: If present, ask: How bad is  the pain?  (e.g., Scale 1-10; mild, moderate, or severe)     5 to 6/10 comes and goes 7. ORAL INTAKE: If vomiting, Have you been able to drink liquids? How much liquids have you had in the past 24 hours?     Eating and drinking 8. HYDRATION: Any signs of dehydration? (e.g., dry mouth [not just dry lips], too weak to stand, dizziness, new weight loss) When did you last urinate?     drinking 9. EXPOSURE: Have you traveled to a foreign country recently? Have you been exposed to anyone with diarrhea? Could you have eaten any food that was spoiled?     na 10. ANTIBIOTIC USE: Are you taking antibiotics now or have you taken antibiotics in the  past 2 months?       no 11. OTHER SYMPTOMS: Do you have any other symptoms? (e.g., fever, blood in stool)       no 12. PREGNANCY: Is there any chance you are pregnant? When was your last menstrual period?       no  Protocols used: Weight Loss - Unintended-A-AH, Diarrhea-A-AH

## 2023-12-11 NOTE — Progress Notes (Unsigned)
 Katie FORBES Doing, DNP, AGNP-c [facility] [address] [place], [place] [address] [phone]   ACUTE VISIT on 12/12/2023  Blood pressure 122/82, pulse 61, weight 168 lb 3.2 oz (76.3 kg).  Subjective:  HPI  History of Present Illness Katie Reed is a 43 year old female who presents with weight loss, diarrhea, and anal fissures.  She has experienced significant weight loss of approximately 11 pounds over the last 2 months and 16 pounds since June. She is concerned about her inability to gain weight, and her husband shares this concern.  She reports diarrhea, with an inability to retain food after eating and frequent bowel movements. There is no constipation or bleeding, but she experiences a sensation of 'something poking' and irritation around the anal area. She has not been using the nitroglycerin  ointment previously prescribed for her anal fissures as she has ran out.  She has a history of anal fissures and external hemorrhoids, with one hemorrhoid causing significant discomfort, described as a 'pin' sensation. The internal fissure has improved, and she no longer feels pain during defecation. No bleeding from the hemorrhoids has been observed. She was referred to general surgery for the anal fissure, but since this improved she did not make the appointment.   She reports symptoms of diarrhea, weight loss, and occasional palpitations. She also experiences profuse sweating when eating and increased anxiety recently.  Her current medications include sertraline , taken occasionally. She was previously using nitroglycerine ointment for the fissures. The compounded ointment was not covered by insurance was too expensive.  She also mentions that vitamin D  was beneficial but has run out.  Chart reviewed-- She cancelled her appointment for CPE with her PCP last month. She had multiple visits in July and August (GI, UC, ER) for rectal pain, fissures, hemorrhoids.   She was  referred to surgeon at last ER visit.  She has a personal h/o colon cancer (dx'd 12/2016). Last colonoscopy was in 11/2021. Past due for CEA and visit with Dr. Cloretta (ordered to be done in April--she missed her last appt, no future appts scheduled) TSH was low on last check a year ago, but normal free T4.  ROS negative except for what is listed in HPI. History, Medications, Surgery, SDOH, and Family History reviewed and updated as appropriate.  Objective:  Physical Exam Vitals and nursing note reviewed. Exam conducted with a chaperone present.  Constitutional:      General: She is not in acute distress.    Appearance: Normal appearance. She is not ill-appearing.  Eyes:     Pupils: Pupils are equal, round, and reactive to light.  Cardiovascular:     Rate and Rhythm: Normal rate and regular rhythm.     Pulses: Normal pulses.     Heart sounds: Normal heart sounds.  Pulmonary:     Effort: Pulmonary effort is normal.     Breath sounds: Normal breath sounds.  Abdominal:     General: Bowel sounds are normal.     Palpations: Abdomen is soft.  Genitourinary:    Exam position: Knee-chest position.   Musculoskeletal:        General: Normal range of motion.  Skin:    General: Skin is warm and dry.     Capillary Refill: Capillary refill takes less than 2 seconds.  Neurological:     Mental Status: She is alert and oriented to person, place, and time.     Motor: No weakness.     Coordination: Coordination normal.  Psychiatric:  Mood and Affect: Mood normal.         Assessment & Plan:   Problem List Items Addressed This Visit     Anal or rectal pain - Primary   Small prolapsed hemorrhoid present with several skin tags noted from previous hemorrhoids. We discussed surgical management, but she declines this today. Will refill topical nitroglycerine for hemorrhoid management. Suspect that increased loose stools has caused this.       History of colon cancer   Missed last  appointment with Dr. Cloretta and is overdue for CEA. Will obtain this lab today and send to him. Symptoms are more likely related to hyperthyroid than return of colon cancer, but will run tests to be safe.       Relevant Orders   CEA   Anal fissure   Anal fissure significantly improved with no bleeding or pain. One thrombosed hemorrhoid causing sharp pain, no bleeding. External skin tags present but not harmful. Diarrhea may contribute to irritation. - Prescribe nitroglycerin  ointment for hemorrhoid treatment - Consider hemorrhoidectomy if symptoms persist or worsen - Discussed that external skin tags are not harmful and do not require removal unless desired      Relevant Medications   Nitroglycerin  0.4 % OINT   Abnormal TSH   Symptoms include diarrhea, weight loss, occasional palpitations, increased anxiety, and sweating postprandially. Previous thyroid  function was borderline overactive. - Order thyroid  function tests - Consider referral to endocrinologist if hyperthyroidism is confirmed      Relevant Orders   CBC with Differential/Platelet   Comprehensive metabolic panel with GFR   T4, free   TSH   Weight loss, unintentional   Chronic diarrhea postprandially with an 11-pound unintentional weight loss. Possible thyroid  dysfunction considered. Will also run CEA to monitor due to history of colon cancer.  - Check thyroid  function tests - Check CEA levels to rule out other causes      Relevant Orders   CBC with Differential/Platelet   Comprehensive metabolic panel with GFR   TSH   CEA   External hemorrhoid   Relevant Medications   Nitroglycerin  0.4 % OINT   Other Visit Diagnoses       History of anemia       Relevant Orders   Iron, TIBC and Ferritin Panel     Vitamin D  deficiency       Relevant Medications   Vitamin D , Ergocalciferol , (DRISDOL ) 1.25 MG (50000 UNIT) CAPS capsule        Katie FORBES Doing, DNP, AGNP-c Time: 36 minutes, >50% spent counseling, care  coordination, chart review, and documentation.

## 2023-12-12 ENCOUNTER — Ambulatory Visit: Admitting: Nurse Practitioner

## 2023-12-12 ENCOUNTER — Encounter: Payer: Self-pay | Admitting: Nurse Practitioner

## 2023-12-12 VITALS — BP 122/82 | HR 61 | Wt 168.2 lb

## 2023-12-12 DIAGNOSIS — Z85038 Personal history of other malignant neoplasm of large intestine: Secondary | ICD-10-CM | POA: Diagnosis not present

## 2023-12-12 DIAGNOSIS — E559 Vitamin D deficiency, unspecified: Secondary | ICD-10-CM

## 2023-12-12 DIAGNOSIS — K6289 Other specified diseases of anus and rectum: Secondary | ICD-10-CM | POA: Diagnosis not present

## 2023-12-12 DIAGNOSIS — R7989 Other specified abnormal findings of blood chemistry: Secondary | ICD-10-CM

## 2023-12-12 DIAGNOSIS — R634 Abnormal weight loss: Secondary | ICD-10-CM

## 2023-12-12 DIAGNOSIS — K602 Anal fissure, unspecified: Secondary | ICD-10-CM

## 2023-12-12 DIAGNOSIS — Z862 Personal history of diseases of the blood and blood-forming organs and certain disorders involving the immune mechanism: Secondary | ICD-10-CM

## 2023-12-12 DIAGNOSIS — K644 Residual hemorrhoidal skin tags: Secondary | ICD-10-CM

## 2023-12-12 MED ORDER — NITROGLYCERIN 0.4 % RE OINT: TOPICAL_OINTMENT | RECTAL | 3 refills | Status: AC

## 2023-12-12 MED ORDER — VITAMIN D (ERGOCALCIFEROL) 1.25 MG (50000 UNIT) PO CAPS
50000.0000 [IU] | ORAL_CAPSULE | ORAL | 3 refills | Status: AC
Start: 2023-12-12 — End: ?

## 2023-12-12 NOTE — Assessment & Plan Note (Signed)
 Symptoms include diarrhea, weight loss, occasional palpitations, increased anxiety, and sweating postprandially. Previous thyroid  function was borderline overactive. - Order thyroid  function tests - Consider referral to endocrinologist if hyperthyroidism is confirmed

## 2023-12-12 NOTE — Assessment & Plan Note (Addendum)
 Anal fissure significantly improved with no bleeding or pain. One thrombosed hemorrhoid causing sharp pain, no bleeding. External skin tags present but not harmful. Diarrhea may contribute to irritation. - Prescribe nitroglycerin  ointment for hemorrhoid treatment - Consider hemorrhoidectomy if symptoms persist or worsen - Discussed that external skin tags are not harmful and do not require removal unless desired

## 2023-12-12 NOTE — Patient Instructions (Addendum)
 Here is some information to review while we wait for the labs Hyperthyroidism Hyperthyroidism refers to a thyroid  gland that is too active or overactive. The thyroid  gland is a small gland located in the lower front part of the neck, just in front of the windpipe (trachea). This gland makes hormones that: Help control how the body uses food for energy (metabolism). Help the heart and brain work well. Keep your bones strong. When the thyroid  is overactive, it produces too much of a hormone called thyroxine. What are the causes? This condition may be caused by: Graves' disease. This is a disorder in which the body's disease-fighting system (immune system) attacks the thyroid  gland. This is the most common cause. Inflammation of the thyroid  gland. A tumor in the thyroid  gland. Use of certain medicines, including: Prescription thyroid  hormone replacement. Herbal supplements that mimic thyroid  hormones. Amiodarone therapy. Solid or fluid-filled lumps within your thyroid  gland (thyroid  nodules). Taking in a large amount of iodine from foods or medicines. What increases the risk? You are more likely to develop this condition if: You are female. You have a family history of thyroid  conditions. You smoke tobacco. You use a medicine called lithium. You take medicines that affect the immune system (immunosuppressants). What are the signs or symptoms? Symptoms of this condition include: Nervousness. Inability to tolerate heat. Diarrhea. Rapid heart rate. Shaky hands. Restlessness. Sleep problems. Other symptoms may include: Heart skipping beats or making extra beats. Unexplained weight loss. Change in the texture of hair or skin. Loss of menstruation. Fatigue. Enlarged thyroid  gland or a lump in the thyroid  (nodule). You may also have symptoms of Graves' disease, which may include: Protruding eyes. Dry eyes. Red or swollen eyes. Problems with vision. How is this diagnosed? This  condition may be diagnosed based on: Your symptoms and medical history. A physical exam. Blood tests. Thyroid  ultrasound. This test involves using sound waves to produce images of the thyroid  gland. A thyroid  scan. A radioactive substance is injected into a vein, and images show how much iodine is present in the thyroid . Radioactive iodine uptake test (RAIU). A small amount of radioactive iodine is given by mouth to see how much iodine the thyroid  absorbs after a certain amount of time. How is this treated? Treatment depends on the cause and severity of the condition. Treatment may include: Medicines to reduce the amount of thyroid  hormone your body makes. Medicines to help manage your symptoms. Radioactive iodine treatment (radioiodine therapy). This involves swallowing a small dose of radioactive iodine, in capsule or liquid form, to kill thyroid  cells. Surgery to remove part or all of your thyroid  gland. You may need to take thyroid  hormone replacement medicine for the rest of your life after thyroid  surgery. Follow these instructions at home:  Take over-the-counter and prescription medicines only as told by your health care provider. Do not use any products that contain nicotine or tobacco. These products include cigarettes, chewing tobacco, and vaping devices, such as e-cigarettes. If you need help quitting, ask your health care provider. Follow any instructions from your health care provider about diet. You may be instructed to limit foods that contain iodine. Keep all follow-up visits. You will need to have blood tests regularly so that your health care provider can monitor your condition. Where to find more information [employer] of Diabetes and Digestive [employer] Kidney Diseases: [email or web address] Contact a health care provider if: Your symptoms do not get better with treatment. You have a fever. You have abdominal pain. You  feel dizzy. You are taking thyroid  hormone replacement  medicine and: You have symptoms of depression. You feel like you are tired all the time. You gain weight. Get help right away if: You have sudden, unexplained confusion or other mental changes. You have chest pain. You have fast or irregular heartbeats (palpitations). You have difficulty breathing. These symptoms may be an emergency. Get help right away. Call 911. Do not wait to see if the symptoms will go away. Do not drive yourself to the hospital. Summary The thyroid  gland is a small gland located in the lower front part of the neck, just in front of the windpipe. Hyperthyroidism is when the thyroid  gland is too active and produces too much of a hormone called thyroxine. The most common cause is Graves' disease, a disorder in which your immune system attacks the thyroid  gland. Hyperthyroidism can cause various symptoms, such as unexplained weight loss, nervousness, inability to tolerate heat, or changes in your heartbeat. Treatment may include medicine to reduce the amount of thyroid  hormone your body makes, radioiodine therapy, surgery, or medicines to manage symptoms. This information is not intended to replace advice given to you by your health care provider. Make sure you discuss any questions you have with your health care provider. Document Revised: 04/08/2021 Document Reviewed: 04/08/2021 Elsevier Patient Education  2024 [employer].

## 2023-12-12 NOTE — Assessment & Plan Note (Signed)
 Missed last appointment with Dr. Cloretta and is overdue for CEA. Will obtain this lab today and send to him. Symptoms are more likely related to hyperthyroid than return of colon cancer, but will run tests to be safe.

## 2023-12-12 NOTE — Assessment & Plan Note (Signed)
 Small prolapsed hemorrhoid present with several skin tags noted from previous hemorrhoids. We discussed surgical management, but she declines this today. Will refill topical nitroglycerine for hemorrhoid management. Suspect that increased loose stools has caused this.

## 2023-12-12 NOTE — Assessment & Plan Note (Signed)
 Chronic diarrhea postprandially with an 11-pound unintentional weight loss. Possible thyroid  dysfunction considered. Will also run CEA to monitor due to history of colon cancer.  - Check thyroid  function tests - Check CEA levels to rule out other causes

## 2023-12-13 ENCOUNTER — Ambulatory Visit: Payer: Self-pay | Admitting: Nurse Practitioner

## 2023-12-13 DIAGNOSIS — F418 Other specified anxiety disorders: Secondary | ICD-10-CM

## 2023-12-13 DIAGNOSIS — R634 Abnormal weight loss: Secondary | ICD-10-CM

## 2023-12-13 DIAGNOSIS — L749 Eccrine sweat disorder, unspecified: Secondary | ICD-10-CM

## 2023-12-13 DIAGNOSIS — R7989 Other specified abnormal findings of blood chemistry: Secondary | ICD-10-CM

## 2023-12-13 DIAGNOSIS — R002 Palpitations: Secondary | ICD-10-CM

## 2023-12-13 LAB — CBC WITH DIFFERENTIAL/PLATELET
Basophils Absolute: 0.1 x10E3/uL (ref 0.0–0.2)
Basos: 1 %
EOS (ABSOLUTE): 0.1 x10E3/uL (ref 0.0–0.4)
Eos: 3 %
Hematocrit: 36.6 % (ref 34.0–46.6)
Hemoglobin: 11.6 g/dL (ref 11.1–15.9)
Immature Grans (Abs): 0 x10E3/uL (ref 0.0–0.1)
Immature Granulocytes: 0 %
Lymphocytes Absolute: 2.3 x10E3/uL (ref 0.7–3.1)
Lymphs: 49 %
MCH: 30.7 pg (ref 26.6–33.0)
MCHC: 31.7 g/dL (ref 31.5–35.7)
MCV: 97 fL (ref 79–97)
Monocytes Absolute: 0.3 x10E3/uL (ref 0.1–0.9)
Monocytes: 6 %
Neutrophils Absolute: 1.9 x10E3/uL (ref 1.4–7.0)
Neutrophils: 41 %
Platelets: 358 x10E3/uL (ref 150–450)
RBC: 3.78 x10E6/uL (ref 3.77–5.28)
RDW: 12.2 % (ref 11.7–15.4)
WBC: 4.7 x10E3/uL (ref 3.4–10.8)

## 2023-12-13 LAB — IRON,TIBC AND FERRITIN PANEL
Ferritin: 116 ng/mL (ref 15–150)
Iron Saturation: 15 % (ref 15–55)
Iron: 55 ug/dL (ref 27–159)
Total Iron Binding Capacity: 356 ug/dL (ref 250–450)
UIBC: 301 ug/dL (ref 131–425)

## 2023-12-13 LAB — COMPREHENSIVE METABOLIC PANEL WITH GFR
ALT: 29 IU/L (ref 0–32)
AST: 19 IU/L (ref 0–40)
Albumin: 4.3 g/dL (ref 3.9–4.9)
Alkaline Phosphatase: 55 IU/L (ref 41–116)
BUN/Creatinine Ratio: 15 (ref 9–23)
BUN: 12 mg/dL (ref 6–24)
Bilirubin Total: 0.3 mg/dL (ref 0.0–1.2)
CO2: 24 mmol/L (ref 20–29)
Calcium: 9.4 mg/dL (ref 8.7–10.2)
Chloride: 104 mmol/L (ref 96–106)
Creatinine, Ser: 0.81 mg/dL (ref 0.57–1.00)
Globulin, Total: 2.5 g/dL (ref 1.5–4.5)
Glucose: 88 mg/dL (ref 70–99)
Potassium: 4.1 mmol/L (ref 3.5–5.2)
Sodium: 141 mmol/L (ref 134–144)
Total Protein: 6.8 g/dL (ref 6.0–8.5)
eGFR: 92 mL/min/1.73 (ref >59)

## 2023-12-13 LAB — T4, FREE: Free T4: 1.09 ng/dL (ref 0.82–1.77)

## 2023-12-13 LAB — TSH: TSH: 0.157 u[IU]/mL — AB (ref 0.450–4.500)

## 2023-12-13 LAB — CEA: CEA: 4.4 ng/mL (ref 0.0–4.7)

## 2023-12-15 LAB — THYROID PEROXIDASE ANTIBODY: Thyroperoxidase Ab SerPl-aCnc: <9 [IU]/mL (ref 0–34)

## 2023-12-15 LAB — THYROGLOBULIN ANTIBODY: Thyroglobulin Antibody: <1 [IU]/mL (ref 0.0–0.9)

## 2023-12-15 LAB — SPECIMEN STATUS REPORT

## 2023-12-15 LAB — T3, FREE: T3, Free: 2.4 pg/mL (ref 2.0–4.4)

## 2023-12-20 ENCOUNTER — Encounter: Admitting: Nurse Practitioner

## 2023-12-26 ENCOUNTER — Other Ambulatory Visit: Payer: Self-pay | Admitting: Family Medicine

## 2023-12-26 DIAGNOSIS — K6289 Other specified diseases of anus and rectum: Secondary | ICD-10-CM

## 2024-06-10 ENCOUNTER — Ambulatory Visit: Admitting: "Endocrinology

## 2024-07-08 ENCOUNTER — Encounter: Payer: Self-pay | Admitting: Nurse Practitioner
